# Patient Record
Sex: Male | Born: 1945
Health system: Southern US, Community
[De-identification: ages and names within clinical notes are randomized; demographics above are authoritative.]

## PROBLEM LIST (undated history)

## (undated) DIAGNOSIS — Z8679 Personal history of other diseases of the circulatory system: Secondary | ICD-10-CM

## (undated) DIAGNOSIS — Z87442 Personal history of urinary calculi: Secondary | ICD-10-CM

## (undated) DIAGNOSIS — E785 Hyperlipidemia, unspecified: Secondary | ICD-10-CM

## (undated) DIAGNOSIS — F329 Major depressive disorder, single episode, unspecified: Secondary | ICD-10-CM

## (undated) DIAGNOSIS — F32A Depression, unspecified: Secondary | ICD-10-CM

## (undated) DIAGNOSIS — I10 Essential (primary) hypertension: Secondary | ICD-10-CM

## (undated) DIAGNOSIS — K5730 Diverticulosis of large intestine without perforation or abscess without bleeding: Secondary | ICD-10-CM

## (undated) DIAGNOSIS — F429 Obsessive-compulsive disorder, unspecified: Secondary | ICD-10-CM

## (undated) DIAGNOSIS — N201 Calculus of ureter: Secondary | ICD-10-CM

## (undated) DIAGNOSIS — C61 Malignant neoplasm of prostate: Secondary | ICD-10-CM

## (undated) DIAGNOSIS — F419 Anxiety disorder, unspecified: Secondary | ICD-10-CM

## (undated) DIAGNOSIS — Z9889 Other specified postprocedural states: Secondary | ICD-10-CM

## (undated) HISTORY — DX: Obsessive-compulsive disorder, unspecified: F42.9

## (undated) HISTORY — PX: HERNIA REPAIR: SHX51

## (undated) HISTORY — DX: Depression, unspecified: F32.A

## (undated) HISTORY — DX: Major depressive disorder, single episode, unspecified: F32.9

## (undated) HISTORY — PX: TONSILLECTOMY: SUR1361

## (undated) HISTORY — DX: Essential (primary) hypertension: I10

## (undated) HISTORY — PX: WISDOM TOOTH EXTRACTION: SHX21

## (undated) HISTORY — DX: Hyperlipidemia, unspecified: E78.5

## (undated) HISTORY — DX: Diverticulosis of large intestine without perforation or abscess without bleeding: K57.30

---

## 1999-04-05 ENCOUNTER — Encounter: Payer: Self-pay | Admitting: Emergency Medicine

## 1999-04-05 ENCOUNTER — Emergency Department (HOSPITAL_COMMUNITY): Admission: EM | Admit: 1999-04-05 | Discharge: 1999-04-05 | Payer: Self-pay | Admitting: Emergency Medicine

## 1999-04-06 ENCOUNTER — Ambulatory Visit (HOSPITAL_COMMUNITY): Admission: RE | Admit: 1999-04-06 | Discharge: 1999-04-06 | Payer: Self-pay | Admitting: Urology

## 1999-04-06 ENCOUNTER — Encounter: Payer: Self-pay | Admitting: Urology

## 1999-04-07 ENCOUNTER — Emergency Department (HOSPITAL_COMMUNITY): Admission: EM | Admit: 1999-04-07 | Discharge: 1999-04-07 | Payer: Self-pay | Admitting: Emergency Medicine

## 1999-04-07 ENCOUNTER — Encounter: Payer: Self-pay | Admitting: Emergency Medicine

## 1999-04-09 ENCOUNTER — Emergency Department (HOSPITAL_COMMUNITY): Admission: EM | Admit: 1999-04-09 | Discharge: 1999-04-09 | Payer: Self-pay | Admitting: Internal Medicine

## 1999-04-09 ENCOUNTER — Encounter: Payer: Self-pay | Admitting: Urology

## 2006-03-26 ENCOUNTER — Ambulatory Visit: Payer: Self-pay | Admitting: Family Medicine

## 2007-04-17 HISTORY — PX: COLONOSCOPY: SHX174

## 2007-05-08 ENCOUNTER — Ambulatory Visit: Payer: Self-pay | Admitting: Family Medicine

## 2007-05-12 ENCOUNTER — Ambulatory Visit: Payer: Self-pay | Admitting: Gastroenterology

## 2007-05-23 ENCOUNTER — Ambulatory Visit: Payer: Self-pay | Admitting: Gastroenterology

## 2007-05-23 LAB — HM COLONOSCOPY

## 2008-05-06 ENCOUNTER — Ambulatory Visit: Payer: Self-pay | Admitting: Family Medicine

## 2009-02-11 ENCOUNTER — Ambulatory Visit: Payer: Self-pay | Admitting: Family Medicine

## 2009-04-25 ENCOUNTER — Ambulatory Visit: Payer: Self-pay | Admitting: Family Medicine

## 2009-06-10 ENCOUNTER — Ambulatory Visit: Payer: Self-pay | Admitting: Family Medicine

## 2009-07-21 ENCOUNTER — Ambulatory Visit: Payer: Self-pay | Admitting: Family Medicine

## 2010-06-12 ENCOUNTER — Encounter (INDEPENDENT_AMBULATORY_CARE_PROVIDER_SITE_OTHER): Payer: BC Managed Care – PPO | Admitting: Family Medicine

## 2010-06-12 DIAGNOSIS — E559 Vitamin D deficiency, unspecified: Secondary | ICD-10-CM

## 2010-06-12 DIAGNOSIS — Z0000 Encounter for general adult medical examination without abnormal findings: Secondary | ICD-10-CM

## 2010-06-12 DIAGNOSIS — J069 Acute upper respiratory infection, unspecified: Secondary | ICD-10-CM

## 2010-06-12 DIAGNOSIS — Z79899 Other long term (current) drug therapy: Secondary | ICD-10-CM

## 2010-11-27 ENCOUNTER — Encounter: Payer: Self-pay | Admitting: Family Medicine

## 2010-11-27 ENCOUNTER — Ambulatory Visit (INDEPENDENT_AMBULATORY_CARE_PROVIDER_SITE_OTHER): Payer: BC Managed Care – PPO | Admitting: Family Medicine

## 2010-11-27 VITALS — BP 120/84 | HR 76 | Ht 70.0 in | Wt 167.0 lb

## 2010-11-27 DIAGNOSIS — M545 Low back pain, unspecified: Secondary | ICD-10-CM

## 2010-11-27 DIAGNOSIS — S7000XA Contusion of unspecified hip, initial encounter: Secondary | ICD-10-CM

## 2010-11-27 MED ORDER — KETOROLAC TROMETHAMINE 60 MG/2ML IM SOLN
60.0000 mg | Freq: Four times a day (QID) | INTRAMUSCULAR | Status: AC | PRN
  Administered 2010-11-27: 60 mg via INTRAMUSCULAR

## 2010-11-27 MED ORDER — NAPROXEN 500 MG PO TABS: 500.0000 mg | ORAL_TABLET | Freq: Two times a day (BID) | ORAL | Status: DC

## 2010-11-27 NOTE — Patient Instructions (Signed)
Hip and back strain after fall today.  Do not suspect fracture.  Heat, stretches, avoidance of bending/lifting/twisting.  Anti-inflammatories.  If worsening pain, please return for re-evaluation  You may start the Naproxen 6 hours after the Toradol today.  Do not take other anti-inflammatories along with the Naproxen.  Tylenol is fine to take along with it, if you need additional pain medication

## 2010-11-27 NOTE — Progress Notes (Signed)
Patient presents for evaluation of right hip pain, s/p fall this morning.  Fell playing tennis this morning, landing directly onto his right lateral hip. Now having some pain in his R lower back, hip.  He has a history of intermittent problems with R sided low back pain.  He took some ibuprofen (400 mg twice during the day today), hard to say if it helped.  Pain is less when sitting still, just occasional twinge, but hurts to move.  Hurts with standing, movements (not specifically with weight bearing though), more of an aching in the R low back.  Denies any groin pain.  Past Medical History  Diagnosis Date  . Depression     and OCD  . Diverticulosis of colon   . Renal calculus or stone   . Unspecified vitamin D deficiency     History reviewed. No pertinent past surgical history.  History   Social History  . Marital Status: Single    Spouse Name: N/A    Number of Children: N/A  . Years of Education: N/A   Occupational History  . Not on file.   Social History Main Topics  . Smoking status: Former Smoker    Quit date: 04/16/2005  . Smokeless tobacco: Never Used  . Alcohol Use: Yes     2-3 glasses of wine per night  . Drug Use: No  . Sexually Active: Not on file   Other Topics Concern  . Not on file   Social History Narrative  . No narrative on file    No family history on file.  Current outpatient prescriptions:aspirin 81 MG tablet, Take 81 mg by mouth daily.  , Disp: , Rfl: ;  Cholecalciferol (VITAMIN D) 2000 UNITS CAPS, Take 1 capsule by mouth daily.  , Disp: , Rfl: ;  fluvoxaMINE (LUVOX) 100 MG tablet, Take 100-200 mg by mouth at bedtime.  , Disp: , Rfl: ;  Multiple Vitamins-Minerals (MULTIVITAMIN WITH MINERALS) tablet, Take 1 tablet by mouth daily.  , Disp: , Rfl:  naproxen (NAPROSYN) 500 MG tablet, Take 1 tablet (500 mg total) by mouth 2 (two) times daily with a meal., Disp: 30 tablet, Rfl: 0 Current facility-administered medications:ketorolac (TORADOL) injection 60 mg,  60 mg, Intramuscular, Q6H PRN, Lavonda Jumbo, MD, 60 mg at 11/27/10 1618  No Known Allergies  ROS:  Denies bleeding, bruising, fever, chest pain, numbness, tinging, weakness or other concerns. No congestion, URI symtpoms. See HPI  PHYSICAL EXAM: BP 120/84  Pulse 76  Ht 5\' 10"  (1.778 m)  Wt 167 lb (75.751 kg)  BMI 23.96 kg/m2 Well developed, pleasant male, in mild discomfort with certain movements, appears comfortable at rest Spine nontender.  Tender at R SI joint and surrounding areas.  Mild spasm of R paraspinous muscles.  Pain in R low back with movement of L leg (testing of pyriformis muscles)  Slight tightness of R pyriformis.  Negative SLR.  Tender over R greater trochanter. DTR's 2+ and symmetric, normal strength, sensation. Normal gait Skin: intact Psych: normal mood, affect  ASSESSMENT/PLAN: 1. Lumbago  naproxen (NAPROSYN) 500 MG tablet, ketorolac (TORADOL) injection 60 mg  2. Contusion, hip     right hip, s/p fall today while playing tennis    Hip and back strain s/p fall today.  Do not suspect fracture.  Heat, stretches, avoidance of bending/lifting/twisting.  Anti-inflammatories.  If worsening pain, please return for re-evaluation. NSAID precautions reviewed

## 2011-07-03 ENCOUNTER — Encounter: Payer: Self-pay | Admitting: Internal Medicine

## 2011-07-04 ENCOUNTER — Encounter: Payer: Self-pay | Admitting: Family Medicine

## 2011-07-04 ENCOUNTER — Ambulatory Visit (INDEPENDENT_AMBULATORY_CARE_PROVIDER_SITE_OTHER): Payer: BC Managed Care – PPO | Admitting: Family Medicine

## 2011-07-04 VITALS — BP 136/90 | HR 58 | Ht 69.5 in | Wt 170.0 lb

## 2011-07-04 DIAGNOSIS — Z0000 Encounter for general adult medical examination without abnormal findings: Secondary | ICD-10-CM

## 2011-07-04 DIAGNOSIS — Z8639 Personal history of other endocrine, nutritional and metabolic disease: Secondary | ICD-10-CM

## 2011-07-04 DIAGNOSIS — Z87442 Personal history of urinary calculi: Secondary | ICD-10-CM | POA: Insufficient documentation

## 2011-07-04 DIAGNOSIS — E785 Hyperlipidemia, unspecified: Secondary | ICD-10-CM | POA: Insufficient documentation

## 2011-07-04 DIAGNOSIS — Z87898 Personal history of other specified conditions: Secondary | ICD-10-CM

## 2011-07-04 DIAGNOSIS — J309 Allergic rhinitis, unspecified: Secondary | ICD-10-CM

## 2011-07-04 DIAGNOSIS — Z125 Encounter for screening for malignant neoplasm of prostate: Secondary | ICD-10-CM

## 2011-07-04 DIAGNOSIS — N200 Calculus of kidney: Secondary | ICD-10-CM

## 2011-07-04 DIAGNOSIS — F429 Obsessive-compulsive disorder, unspecified: Secondary | ICD-10-CM

## 2011-07-04 DIAGNOSIS — J302 Other seasonal allergic rhinitis: Secondary | ICD-10-CM

## 2011-07-04 DIAGNOSIS — E782 Mixed hyperlipidemia: Secondary | ICD-10-CM | POA: Insufficient documentation

## 2011-07-04 HISTORY — DX: Hyperlipidemia, unspecified: E78.5

## 2011-07-04 HISTORY — DX: Obsessive-compulsive disorder, unspecified: F42.9

## 2011-07-04 LAB — PSA: PSA: 3.29 ng/mL (ref <=4.00)

## 2011-07-04 LAB — HEMOCCULT GUIAC POC 1CARD (OFFICE)

## 2011-07-04 MED ORDER — FLUVOXAMINE MALEATE 100 MG PO TABS: 100.0000 mg | ORAL_TABLET | Freq: Every day | ORAL | Status: DC

## 2011-07-04 NOTE — Progress Notes (Signed)
  Subjective:    Patient ID: [name] [name], male    DOB: February 01, 1946, 66 y.o.   MRN: [id number]  HPI He is here for complete examination. He has no particular concerns or complaints. His immunizations are up-to-date. He continues on his Luvox and is doing quite well on this. His work is going well. He is not involved in a relationship. He has not had any difficulty with renal stones in his allergies are under good control.   Review of Systems  Constitutional: Negative.   HENT: Negative.   Eyes: Negative.   Respiratory: Negative.   Cardiovascular: Negative.   Gastrointestinal: Negative.   Genitourinary: Negative.   Musculoskeletal: Negative.   Skin: Negative.   Neurological: Negative.   Hematological: Negative.   Psychiatric/Behavioral: Negative.        Objective:   Physical Exam BP 136/90  Pulse 58  Ht 5' 9.5" (1.765 m)  Wt 170 lb (77.111 kg)  BMI 24.74 kg/m2  General Appearance:    Alert, cooperative, no distress, appears stated age  Head:    Normocephalic, without obvious abnormality, atraumatic  Eyes:    PERRL, conjunctiva/corneas clear, EOM's intact, fundi    benign  Ears:    Normal TM's and external ear canals  Nose:   Nares normal, mucosa normal, no drainage or sinus   tenderness  Throat:   Lips, mucosa, and tongue normal; teeth and gums normal  Neck:   Supple, no lymphadenopathy;  thyroid:  no   enlargement/tenderness/nodules; no carotid   bruit or JVD  Back:    Spine nontender, no curvature, ROM normal, no CVA     tenderness  Lungs:     Clear to auscultation bilaterally without wheezes, rales or     ronchi; respirations unlabored  Chest Wall:    No tenderness or deformity   Heart:    Regular rate and rhythm, S1 and S2 normal, no murmur, rub   or gallop  Breast Exam:    No chest wall tenderness, masses or gynecomastia  Abdomen:     Soft, non-tender, nondistended, normoactive bowel sounds,    no masses, no hepatosplenomegaly  Genitalia:    Normal male external  genitalia without lesions.  Testicles without masses.  No inguinal hernias.  Rectal:    Normal sphincter tone, no masses or tenderness; guaiac negative stool.  Prostate smooth, no nodules, not enlarged.  Extremities:   No clubbing, cyanosis or edema  Pulses:   2+ and symmetric all extremities  Skin:   Skin color, texture, turgor normal, no rashes or lesions  Lymph nodes:   Cervical, supraclavicular, and axillary nodes normal  Neurologic:   CNII-XII intact, normal strength, sensation and gait; reflexes 2+ and symmetric throughout          Psych:   Normal mood, affect, hygiene and grooming.           Assessment & Plan:   1. Routine general medical examination at a health care facility  Hemoccult - 1 Card (office)  2. Special screening for malignant neoplasm of prostate  PSA  3. OCD (obsessive compulsive disorder)  fluvoxaMINE (LUVOX) 100 MG tablet  4. Allergic rhinitis, seasonal    5. Renal stone    6. History of vitamin D deficiency    7. Hyperlipidemia LDL goal < 100

## 2011-11-06 ENCOUNTER — Encounter: Payer: Self-pay | Admitting: Family Medicine

## 2011-11-06 ENCOUNTER — Ambulatory Visit (INDEPENDENT_AMBULATORY_CARE_PROVIDER_SITE_OTHER): Payer: BC Managed Care – PPO | Admitting: Family Medicine

## 2011-11-06 VITALS — BP 150/100 | HR 73 | Wt 167.0 lb

## 2011-11-06 DIAGNOSIS — IMO0002 Reserved for concepts with insufficient information to code with codable children: Secondary | ICD-10-CM

## 2011-11-06 DIAGNOSIS — S76319A Strain of muscle, fascia and tendon of the posterior muscle group at thigh level, unspecified thigh, initial encounter: Secondary | ICD-10-CM

## 2011-11-06 NOTE — Patient Instructions (Signed)
Heat to the area for 20 minutes 3 times per day. Watch your pain free with walking that you can increase to jogging then has to be been full speed and then he can go back to tennis

## 2011-11-06 NOTE — Progress Notes (Signed)
  Subjective:    Patient ID: [name] [name], male    DOB: 1946-01-22, 66 y.o.   MRN: [id number]  HPI While playing tennis several weeks ago he developed some left posterior thigh discomfort. He did give it a rest for a week or 2 however when he started playing tennis again the symptoms recurred. At the present time he is having only minimal discomfort with walking.   Review of Systems     Objective:   Physical Exam Exam of the left hip shows no pain over the initial tuberosity. Exam of the biceps femoris shows no palpable tenderness swelling or defects. Good hip motion.       Assessment & Plan:   1. Hamstring strain    explained the mechanism of the injury. Encouraged him to slowly increase his physical activities based on pain. He'll start walking and then progressed to jogging half and full speed. If he has further difficulty, he will call me.

## 2012-06-26 ENCOUNTER — Encounter: Payer: Self-pay | Admitting: Family Medicine

## 2012-06-26 ENCOUNTER — Ambulatory Visit: Payer: BC Managed Care – PPO | Admitting: Family Medicine

## 2012-06-26 VITALS — BP 110/80 | HR 57 | Wt 169.0 lb

## 2012-06-26 DIAGNOSIS — Z8679 Personal history of other diseases of the circulatory system: Secondary | ICD-10-CM

## 2012-06-26 NOTE — Progress Notes (Signed)
  Subjective:    Patient ID: [name] [name], male    DOB: [birth year], 67 y.o.   MRN: [id number]  HPI He is here for followup visit after a diagnosis of atrial fibrillation. He states that on February 28 he visited friends in [place] and went out to dinner and had more to drink than usual. He was awakened in the middle night with chest tightness, dizziness. He did check his pulse and noted an irregular and fast pulse rate. He then went to the emergency room with a diagnosis of atrial fibrillation was made. He was given IV medications which did not convert him however when he switched to Metroprolol, he returned regular rhythm. He was admitted to the hospital. He distress tests as well as EKGs and blood studies. He was sent home on metoprolol and atorvastatin.   Review of Systems     Objective:   Physical Exam Alert and in no distress. Cardiac exam shows a regular rhythm. Lungs are clear to auscultation.       Assessment & Plan:  History of atrial fibrillation I explained that his atrial fibrillation was probably due to the excessive alcohol consumption. I will review the records and discuss continuation of his beta blocker was cardiologist after it had a chance to get all the records. We also discussed the simvastatin he was placed on. I recommended that he hold this until I can get the records. Previous record indicates an LDL of 131.

## 2012-06-26 NOTE — Patient Instructions (Signed)
Hold the simvastatin. Check with me in about a month and hopefully by then we'll have the hospital record

## 2012-06-30 ENCOUNTER — Encounter: Payer: Self-pay | Admitting: Internal Medicine

## 2012-07-08 ENCOUNTER — Encounter: Payer: BC Managed Care – PPO | Admitting: Family Medicine

## 2012-07-28 ENCOUNTER — Ambulatory Visit (INDEPENDENT_AMBULATORY_CARE_PROVIDER_SITE_OTHER): Payer: BC Managed Care – PPO | Admitting: Family Medicine

## 2012-07-28 ENCOUNTER — Other Ambulatory Visit: Payer: Self-pay | Admitting: Family Medicine

## 2012-07-28 ENCOUNTER — Encounter: Payer: Self-pay | Admitting: Family Medicine

## 2012-07-28 VITALS — BP 118/78 | HR 64 | Ht 70.0 in | Wt 170.0 lb

## 2012-07-28 DIAGNOSIS — Z8679 Personal history of other diseases of the circulatory system: Secondary | ICD-10-CM

## 2012-07-28 DIAGNOSIS — Z125 Encounter for screening for malignant neoplasm of prostate: Secondary | ICD-10-CM

## 2012-07-28 DIAGNOSIS — J309 Allergic rhinitis, unspecified: Secondary | ICD-10-CM

## 2012-07-28 DIAGNOSIS — Z0000 Encounter for general adult medical examination without abnormal findings: Secondary | ICD-10-CM

## 2012-07-28 DIAGNOSIS — J302 Other seasonal allergic rhinitis: Secondary | ICD-10-CM

## 2012-07-28 DIAGNOSIS — F429 Obsessive-compulsive disorder, unspecified: Secondary | ICD-10-CM

## 2012-07-28 DIAGNOSIS — E785 Hyperlipidemia, unspecified: Secondary | ICD-10-CM

## 2012-07-28 HISTORY — DX: Personal history of other diseases of the circulatory system: Z86.79

## 2012-07-28 LAB — POCT URINALYSIS DIPSTICK
Bilirubin, UA: NEGATIVE
Blood, UA: NEGATIVE
Clarity, UA: 5
Glucose, UA: NEGATIVE
Ketones, UA: NEGATIVE
Leukocytes, UA: NEGATIVE
Nitrite, UA: NEGATIVE
Protein, UA: NEGATIVE
Spec Grav, UA: 1.02
Urobilinogen, UA: NEGATIVE
pH, UA: 5

## 2012-07-28 NOTE — Patient Instructions (Signed)
Stop the metoprolol. If you know your heart rate is becoming irregular, let me know

## 2012-07-28 NOTE — Progress Notes (Signed)
Subjective:    Patient ID: [name] [name], male    DOB: Jun 28, 1945, 67 y.o.   MRN: [id number]  HPI For complete examination. He does have a history of atrial fibrillation. The history is significant for this being related to alcohol consumption. He also did have a course of prednisone prior to this that might possibly contribute to it. He has had no chest pain, irregular heartbeat. He did stop taking his Lipitor with his last visit. There is question whether he should also stop the beta blocker. His allergies seem to be under good control. He continues on his psychotropic medications and again is doing well on them. He does see orthopedics for left-sided back. Presently he is taking Mobic for this. The prednisone was given for his back pain. Social and family history were reviewed.   Review of Systems  Constitutional: Negative.   HENT: Negative.   Eyes: Negative.   Respiratory: Negative.   Cardiovascular: Negative.   Gastrointestinal: Negative.   Endocrine: Negative.   Genitourinary: Negative.   Allergic/Immunologic: Negative.   Neurological: Negative.        Objective:   Physical Exam BP 118/78  Pulse 64  Ht 5\' 10"  (1.778 m)  Wt 170 lb (77.111 kg)  BMI 24.39 kg/m2  General Appearance:    Alert, cooperative, no distress, appears stated age  Head:    Normocephalic, without obvious abnormality, atraumatic  Eyes:    PERRL, conjunctiva/corneas clear, EOM's intact,   Ears:    Normal TM's and external ear canals  Nose:   Nares normal, mucosa normal, no drainage or sinus   tenderness  Throat:   Lips, mucosa, and tongue normal; teeth and gums normal  Neck:   Supple, no lymphadenopathy;  thyroid:  no   enlargement/tenderness/nodules; no carotid   bruit or JVD  Back:    Spine nontender, no curvature, ROM normal, no CVA     tenderness  Lungs:     Clear to auscultation bilaterally without wheezes, rales or     ronchi; respirations unlabored  Chest Wall:    No tenderness or deformity   Heart:    Regular rate and rhythm, S1 and S2 normal, no murmur, rub   or gallop  Breast Exam:    No chest wall tenderness, masses or gynecomastia  Abdomen:     Soft, non-tender, nondistended, normoactive bowel sounds,    no masses, no hepatosplenomegaly  Genitalia:  deferred  Rectal:  deferred  Extremities:   No clubbing, cyanosis or edema  Pulses:   2+ and symmetric all extremities  Skin:   Skin color, texture, turgor normal, no rashes or lesions  Lymph nodes:   Cervical, supraclavicular, and axillary nodes normal  Neurologic:   CNII-XII intact, normal strength, sensation and gait; reflexes 2+ and symmetric throughout          Psych:   Normal mood, affect, hygiene and grooming.           Assessment & Plan:  Routine general medical examination at a health care facility  Hyperlipidemia LDL goal < 100 - Plan: POCT urinalysis dipstick  OCD (obsessive compulsive disorder)  Allergic rhinitis, seasonal  History of atrial fibrillation  Special screening for malignant neoplasm of prostate - Plan: PSA discussed followup concerning his atrial fibrillation and at this time we will go into the watchful waiting mode. Also discussed his beta blocker and at this time I will have him stop it. He is to continue on his psychotropic medications. I will give him  stool cards

## 2012-07-29 LAB — OTHER SOLSTAS TEST
PSA, Free Pct: 9 % — ABNORMAL LOW (ref >25)
PSA, Free: 0.35 ng/mL
PSA: 3.78 ng/mL (ref <=4.00)

## 2012-07-29 LAB — PSA: PSA: 3.74 ng/mL (ref <=4.00)

## 2012-07-30 ENCOUNTER — Other Ambulatory Visit: Payer: Self-pay | Admitting: Family Medicine

## 2012-07-30 NOTE — Telephone Encounter (Signed)
Is this okay to fill? 

## 2012-08-04 ENCOUNTER — Other Ambulatory Visit (INDEPENDENT_AMBULATORY_CARE_PROVIDER_SITE_OTHER): Payer: BC Managed Care – PPO

## 2012-08-04 DIAGNOSIS — Z1211 Encounter for screening for malignant neoplasm of colon: Secondary | ICD-10-CM

## 2012-08-04 LAB — POC HEMOCCULT BLD/STL (HOME/3-CARD/SCREEN): Fecal Occult Blood, POC: NEGATIVE

## 2013-01-05 ENCOUNTER — Other Ambulatory Visit: Payer: Self-pay

## 2013-01-05 ENCOUNTER — Telehealth: Payer: Self-pay | Admitting: Family Medicine

## 2013-01-05 ENCOUNTER — Encounter: Payer: Self-pay | Admitting: Gastroenterology

## 2013-01-05 DIAGNOSIS — R6889 Other general symptoms and signs: Secondary | ICD-10-CM

## 2013-01-05 NOTE — Telephone Encounter (Signed)
PT HAS APPOINTMENT OCT 23 DR.KAPLIN AT 9 AM PT IS AWARE

## 2013-01-05 NOTE — Telephone Encounter (Signed)
Go ahead and make the referral 

## 2013-02-05 ENCOUNTER — Ambulatory Visit (INDEPENDENT_AMBULATORY_CARE_PROVIDER_SITE_OTHER): Payer: BC Managed Care – PPO | Admitting: Gastroenterology

## 2013-02-05 ENCOUNTER — Encounter: Payer: Self-pay | Admitting: Gastroenterology

## 2013-02-05 VITALS — BP 122/82 | HR 60 | Ht 70.0 in | Wt 167.5 lb

## 2013-02-05 DIAGNOSIS — R1310 Dysphagia, unspecified: Secondary | ICD-10-CM

## 2013-02-05 NOTE — Patient Instructions (Signed)

## 2013-02-05 NOTE — Progress Notes (Signed)
History of Present Illness: Pleasant 67 year old white male referred for evaluation of dysphagia.  Has been complaining of solid food dysphagia for at least 2 years.  Symptoms are worsening.  He's had minor food impactions.  He denies pyrosis.  Weight is stable.    Past Medical History  Diagnosis Date  . Depression     and OCD  . Diverticulosis of colon   . Renal calculus or stone   . Unspecified vitamin D deficiency   . Elevated troponin 06/15/2012    hx of type 2 elevated troponin secondary to atrial fib  . H/O atrial flutter 06/15/2012    resolved  . Hyperlipidemia 06/15/2012    mild  . Borderline high cholesterol   . Obsessive compulsive disorder   . Depression    Past Surgical History  Procedure Laterality Date  . Colonoscopy  2009    Dr. Arlyce Dice   family history is not on file. Current Outpatient Prescriptions  Medication Sig Dispense Refill  . AMOXICILLIN PO Take by mouth. For dental treatments      . aspirin 81 MG tablet Take 81 mg by mouth daily.        . metoprolol tartrate (LOPRESSOR) 25 MG tablet Take 25 mg by mouth 2 (two) times daily.      . Multiple Vitamins-Minerals (MULTIVITAMIN WITH MINERALS) tablet Take 1 tablet by mouth daily.         No current facility-administered medications for this visit.   Allergies as of 02/05/2013  . (No Known Allergies)    reports that he quit smoking about 7 years ago. He has never used smokeless tobacco. He reports that he drinks about 8.4 ounces of alcohol per week. He reports that he does not use illicit drugs.     Review of Systems: Pertinent positive and negative review of systems were noted in the above HPI section. All other review of systems were otherwise negative.  Vital signs were reviewed in today's medical record Physical Exam: General: Well developed , well nourished, no acute distress Skin: anicteric Head: Normocephalic and atraumatic Eyes:  sclerae anicteric, EOMI Ears: Normal auditory acuity Mouth: No  deformity or lesions Neck: Supple, no masses or thyromegaly Lungs: Clear throughout to auscultation Heart: Regular rate and rhythm; no murmurs, rubs or bruits Abdomen: Soft, non tender and non distended. No masses, hepatosplenomegaly or hernias noted. Normal Bowel sounds Rectal:deferred Musculoskeletal: Symmetrical with no gross deformities  Skin: No lesions on visible extremities Pulses:  Normal pulses noted Extremities: No clubbing, cyanosis, edema or deformities noted Neurological: Alert oriented x 4, grossly nonfocal Cervical Nodes:  No significant cervical adenopathy Inguinal Nodes: No significant inguinal adenopathy Psychological:  Alert and cooperative. Normal mood and affect

## 2013-02-05 NOTE — Assessment & Plan Note (Signed)
2 year history of progressive solid food dysphagia-rule out esophageal stricture  Recommendations #1 upper endoscopy with dilatation as indicated  Risks, alternatives, and complications of the procedure, including bleeding, perforation, and possible need for surgery, were explained to the patient.  Patient's questions were answered.

## 2013-02-09 ENCOUNTER — Encounter: Payer: Self-pay | Admitting: Gastroenterology

## 2013-02-10 ENCOUNTER — Ambulatory Visit (AMBULATORY_SURGERY_CENTER): Payer: BC Managed Care – PPO | Admitting: Gastroenterology

## 2013-02-10 ENCOUNTER — Encounter: Payer: Self-pay | Admitting: Gastroenterology

## 2013-02-10 VITALS — BP 125/72 | HR 53 | Temp 97.3°F | Resp 13 | Ht 70.0 in | Wt 167.0 lb

## 2013-02-10 DIAGNOSIS — K222 Esophageal obstruction: Secondary | ICD-10-CM

## 2013-02-10 DIAGNOSIS — K209 Esophagitis, unspecified without bleeding: Secondary | ICD-10-CM

## 2013-02-10 DIAGNOSIS — K2980 Duodenitis without bleeding: Secondary | ICD-10-CM

## 2013-02-10 DIAGNOSIS — R1310 Dysphagia, unspecified: Secondary | ICD-10-CM

## 2013-02-10 DIAGNOSIS — K2970 Gastritis, unspecified, without bleeding: Secondary | ICD-10-CM

## 2013-02-10 MED ORDER — SODIUM CHLORIDE 0.9 % IV SOLN: 500.0000 mL | INTRAVENOUS | Status: DC

## 2013-02-10 MED ORDER — ESOMEPRAZOLE MAGNESIUM 40 MG PO CPDR: 40.0000 mg | DELAYED_RELEASE_CAPSULE | Freq: Every day | ORAL | Status: DC

## 2013-02-10 NOTE — Patient Instructions (Signed)
YOU HAD AN ENDOSCOPIC PROCEDURE TODAY AT THE [facility]: Refer to the procedure report that was given to you for any specific questions about what was found during the examination.  If the procedure report does not answer your questions, please call your gastroenterologist to clarify.  If you requested that your care partner not be given the details of your procedure findings, then the procedure report has been included in a sealed envelope for you to review at your convenience later.  YOU SHOULD EXPECT: Some feelings of bloating in the abdomen. Passage of more gas than usual.  Walking can help get rid of the air that was put into your GI tract during the procedure and reduce the bloating. If you had a lower endoscopy (such as a colonoscopy or flexible sigmoidoscopy) you may notice spotting of blood in your stool or on the toilet paper. If you underwent a bowel prep for your procedure, then you may not have a normal bowel movement for a few days.  DIET:  NOTHING TO EAT OR DRINK UNTIL 5:00 PM. 5:00 UNTIL 6:00 ONLY CLEAR LIQUIDS. AFTER 6:00 ONLY SOFT FOODS. RESUME YOUR DIET IN AM.  ACTIVITY: Your care partner should take you home directly after the procedure.  You should plan to take it easy, moving slowly for the rest of the day.  You can resume normal activity the day after the procedure however you should NOT DRIVE or use heavy machinery for 24 hours (because of the sedation medicines used during the test).    SYMPTOMS TO REPORT IMMEDIATELY: A gastroenterologist can be reached at any hour.  During normal business hours, 8:30 AM to 5:00 PM Monday through Friday, call [phone].  After hours and on weekends, please call the GI answering service at [phone] who will take a message and have the physician on call contact you.  Following upper endoscopy (EGD)  Vomiting of blood or coffee ground material  New chest pain or pain under the shoulder blades  Painful or persistently  difficult swallowing  New shortness of breath  Fever of 100F or higher  Black, tarry-looking stools  FOLLOW UP: If any biopsies were taken you will be contacted by phone or by letter within the next 1-3 weeks.  Call your gastroenterologist if you have not heard about the biopsies in 3 weeks.  Our staff will call the home number listed on your records the next business day following your procedure to check on you and address any questions or concerns that you may have at that time regarding the information given to you following your procedure. This is a courtesy call and so if there is no answer at the home number and we have not heard from you through the emergency physician on call, we will assume that you have returned to your regular daily activities without incident.  SIGNATURES/CONFIDENTIALITY: You and/or your care partner have signed paperwork which will be entered into your electronic medical record.  These signatures attest to the fact that that the information above on your After Visit Summary has been reviewed and is understood.  Full responsibility of the confidentiality of this discharge information lies with you and/or your care-partner.

## 2013-02-10 NOTE — Op Note (Signed)
[facility] [address] [place], [address]   ENDOSCOPY PROCEDURE REPORT  PATIENT: [name] [name], [name] [name]  MR#: [id number] BIRTHDATE: Jan 22, 1946 , 67  yrs. old GENDER: Male ENDOSCOPIST: Louis Meckel, MD REFERRED BY:  Sharlot Gowda, M.D. PROCEDURE DATE:  02/10/2013 PROCEDURE:  EGD w/ biopsy and Maloney dilation of esophagus ASA CLASS:     Class II INDICATIONS:  Dysphagia. MEDICATIONS: MAC sedation, administered by CRNA, propofol (Diprivan) 300mg  IV, and Simethicone 0.6cc PO TOPICAL ANESTHETIC: Cetacaine Spray  DESCRIPTION OF PROCEDURE: After the risks benefits and alternatives of the procedure were thoroughly explained, informed consent was obtained.  The LB [id number] endoscope was introduced through the mouth and advanced to the third portion of the duodenum. Without limitations.  The instrument was slowly withdrawn as the mucosa was fully examined.      There was a moderate stricture at the GE junction with areas of erosive esophagitis..  The 9.8 mm gastroscope easily traversed the stricture. Were multiple superficial erosions in the gastric antrum and duodenal bulb.  Biopsies were taken in the duodenal bulb.   The remainder of the upper endoscopy exam was otherwise normal. Retroflexed views revealed no abnormalities.     The scope was then withdrawn from the patient.  A #52 Jerene Dilling dilator was passed with mild resistance.  There was no heme.  COMPLICATIONS: There were no complications. ENDOSCOPIC IMPRESSION: 1.  esophageal stricture-status post Maloney dilation 2.  erosive esophagitis, gastritis and duodenitis  RECOMMENDATIONS: 1.  Begin Nexium 40 mg daily 2.  await biopsy 3.  avoid NSAIDs 4.  office visit one month  REPEAT EXAM:  eSigned:  Louis Meckel, MD 02/10/2013 4:08 PM   CC:  PATIENT NAME:  [name] [name], [name] [name] MR#: [id number]

## 2013-02-10 NOTE — Progress Notes (Signed)
Lidocaine-40mg IV prior to Propofol InductionPropofol given over incremental dosages 

## 2013-02-10 NOTE — Progress Notes (Signed)
1545  BP 154/98 advised Brennan Bailey CRNA, of current BP and BPs on adm.  Left 160/101, and Right 167/20.

## 2013-02-10 NOTE — Progress Notes (Signed)
Patient did not experience any of the following events: a burn prior to discharge; a fall within the facility; wrong site/side/patient/procedure/implant event; or a hospital transfer or hospital admission upon discharge from the facility. (G8907) Patient did not have preoperative order for IV antibiotic SSI prophylaxis. (G8918)  

## 2013-02-10 NOTE — Progress Notes (Signed)
Called to room to assist during endoscopic procedure.  Patient ID and intended procedure confirmed with present staff. Received instructions for my participation in the procedure from the performing physician.  

## 2013-02-11 ENCOUNTER — Telehealth: Payer: Self-pay | Admitting: *Deleted

## 2013-02-11 NOTE — Telephone Encounter (Signed)
  Follow up Call-  Call back number 02/10/2013  Post procedure Call Back phone  # [phone]  Permission to leave phone message Yes   [facility]

## 2013-02-18 ENCOUNTER — Encounter: Payer: Self-pay | Admitting: Gastroenterology

## 2013-02-19 ENCOUNTER — Other Ambulatory Visit: Payer: Self-pay

## 2013-03-16 ENCOUNTER — Ambulatory Visit: Payer: BC Managed Care – PPO | Admitting: Gastroenterology

## 2013-04-01 ENCOUNTER — Encounter: Payer: Self-pay | Admitting: Gastroenterology

## 2013-04-01 ENCOUNTER — Ambulatory Visit (INDEPENDENT_AMBULATORY_CARE_PROVIDER_SITE_OTHER): Payer: BC Managed Care – PPO | Admitting: Gastroenterology

## 2013-04-01 VITALS — BP 130/80 | HR 64 | Ht 69.5 in | Wt 166.5 lb

## 2013-04-01 DIAGNOSIS — Z8719 Personal history of other diseases of the digestive system: Secondary | ICD-10-CM

## 2013-04-01 DIAGNOSIS — K222 Esophageal obstruction: Secondary | ICD-10-CM

## 2013-04-01 DIAGNOSIS — R1310 Dysphagia, unspecified: Secondary | ICD-10-CM

## 2013-04-01 HISTORY — DX: Personal history of other diseases of the digestive system: Z87.19

## 2013-04-01 NOTE — Progress Notes (Signed)
          History of Present Illness:  The patient has returned following upper endoscopy with [facility] dilation.  He no longer has dysphagia.  He takes Nexium daily.  He denies pyrosis.    Review of Systems: Pertinent positive and negative review of systems were noted in the above HPI section. All other review of systems were otherwise negative.    Current Medications, Allergies, Past Medical History, Past Surgical History, Family History and Social History were reviewed in [employer] electronic medical record  Vital signs were reviewed in today's medical record. Physical Exam: General: Well developed , well nourished, no acute distress

## 2013-04-01 NOTE — Patient Instructions (Signed)
Follow up as needed

## 2013-04-01 NOTE — Assessment & Plan Note (Signed)
Resolved following Maloney dilation

## 2013-04-01 NOTE — Assessment & Plan Note (Signed)
Asymptomatic following dilatation therapy.  Plan to continue Nexium at a maintenance dose every 2-3 days and repeat dilatation as needed.

## 2013-07-30 ENCOUNTER — Encounter: Payer: Self-pay | Admitting: Family Medicine

## 2013-07-30 ENCOUNTER — Ambulatory Visit (INDEPENDENT_AMBULATORY_CARE_PROVIDER_SITE_OTHER): Payer: BC Managed Care – PPO | Admitting: Family Medicine

## 2013-07-30 VITALS — BP 140/98 | HR 56 | Ht 69.0 in | Wt 168.0 lb

## 2013-07-30 DIAGNOSIS — E785 Hyperlipidemia, unspecified: Secondary | ICD-10-CM

## 2013-07-30 DIAGNOSIS — Z8679 Personal history of other diseases of the circulatory system: Secondary | ICD-10-CM

## 2013-07-30 DIAGNOSIS — F429 Obsessive-compulsive disorder, unspecified: Secondary | ICD-10-CM

## 2013-07-30 DIAGNOSIS — J309 Allergic rhinitis, unspecified: Secondary | ICD-10-CM

## 2013-07-30 DIAGNOSIS — K222 Esophageal obstruction: Secondary | ICD-10-CM

## 2013-07-30 DIAGNOSIS — R972 Elevated prostate specific antigen [PSA]: Secondary | ICD-10-CM

## 2013-07-30 DIAGNOSIS — Z79899 Other long term (current) drug therapy: Secondary | ICD-10-CM

## 2013-07-30 DIAGNOSIS — N200 Calculus of kidney: Secondary | ICD-10-CM

## 2013-07-30 DIAGNOSIS — J302 Other seasonal allergic rhinitis: Secondary | ICD-10-CM

## 2013-07-30 LAB — CBC WITH DIFFERENTIAL/PLATELET
Basophils Absolute: 0 10*3/uL (ref 0.0–0.1)
Basophils Relative: 0 % (ref 0–1)
Eosinophils Absolute: 0.2 10*3/uL (ref 0.0–0.7)
Eosinophils Relative: 4 % (ref 0–5)
HCT: 47 % (ref 39.0–52.0)
Hemoglobin: 16.1 g/dL (ref 13.0–17.0)
Lymphocytes Relative: 38 % (ref 12–46)
Lymphs Abs: 1.7 10*3/uL (ref 0.7–4.0)
MCH: 31.6 pg (ref 26.0–34.0)
MCHC: 34.3 g/dL (ref 30.0–36.0)
MCV: 92.3 fL (ref 78.0–100.0)
Monocytes Absolute: 0.5 10*3/uL (ref 0.1–1.0)
Monocytes Relative: 12 % (ref 3–12)
Neutro Abs: 2.1 10*3/uL (ref 1.7–7.7)
Neutrophils Relative %: 46 % (ref 43–77)
Platelets: 157 10*3/uL (ref 150–400)
RBC: 5.09 MIL/uL (ref 4.22–5.81)
RDW: 13.7 % (ref 11.5–15.5)
WBC: 4.5 10*3/uL (ref 4.0–10.5)

## 2013-07-30 LAB — COMPREHENSIVE METABOLIC PANEL
ALT: 22 U/L (ref 0–53)
AST: 29 U/L (ref 0–37)
Albumin: 4.4 g/dL (ref 3.5–5.2)
Alkaline Phosphatase: 76 U/L (ref 39–117)
BUN: 18 mg/dL (ref 6–23)
CO2: 27 mEq/L (ref 19–32)
Calcium: 9.4 mg/dL (ref 8.4–10.5)
Chloride: 105 mEq/L (ref 96–112)
Creat: 1.05 mg/dL (ref 0.50–1.35)
Glucose, Bld: 86 mg/dL (ref 70–99)
Potassium: 4.3 mEq/L (ref 3.5–5.3)
Sodium: 140 mEq/L (ref 135–145)
Total Bilirubin: 0.6 mg/dL (ref 0.2–1.2)
Total Protein: 6.4 g/dL (ref 6.0–8.3)

## 2013-07-30 LAB — LIPID PANEL
Cholesterol: 234 mg/dL — ABNORMAL HIGH (ref 0–200)
HDL: 64 mg/dL (ref >39)
LDL Cholesterol: 134 mg/dL — ABNORMAL HIGH (ref 0–99)
Total CHOL/HDL Ratio: 3.7 Ratio
Triglycerides: 178 mg/dL — ABNORMAL HIGH (ref <150)
VLDL: 36 mg/dL (ref 0–40)

## 2013-07-30 MED ORDER — FLUVOXAMINE MALEATE ER 100 MG PO CP24: 100.0000 mg | ORAL_CAPSULE | Freq: Every day | ORAL | Status: DC

## 2013-07-30 NOTE — Progress Notes (Signed)
   Subjective:    Patient ID: [name] [name], male    DOB: May 06, 1945, 68 y.o.   MRN: [id number]  HPI He is here for a medication check. He has noted increased difficulty with swallowing and now notes that carrots do tend to get slightly stuck. He has a previous history of esophageal stricture and did have a dilatation done last year. He is supposed to be taking Nexium .He continues on Luvox and is doing well on this. He has a previous history of renal stones but no recent difficulty. He also has a past history of atrial fibrillation and again no regular heartbeat noted. Review of record indicates elevated lipid panel. His allergies are under good control. He also has a PSA in the high 3 range. His work and home life are unchanged. He has no plans to retire anytime soon. His immunizations and health maintenance was reviewed.  Review of Systems  All other systems reviewed and are negative.      Objective:   Physical Exam alert and in no distress. Tympanic membranes and canals are normal. Throat is clear. Tonsils are normal. Neck is supple without adenopathy or thyromegaly. Cardiac exam shows a regular sinus rhythm without murmurs or gallops. Lungs are clear to auscultation. Abdominal exam shows no masses or tenderness with normal bowel sounds.        Assessment & Plan:  OCD (obsessive compulsive disorder) - Plan: Fluvoxamine Maleate (LUVOX CR) 100 MG CP24  Stricture and stenosis of esophagus - Plan: CBC with Differential, Comprehensive metabolic panel  Renal stone - Plan: CBC with Differential, Comprehensive metabolic panel  History of atrial fibrillation  Hyperlipidemia LDL goal < 100 - Plan: Lipid panel  Allergic rhinitis, seasonal  Elevated PSA - Plan: PSA, total and free  Encounter for long-term (current) use of other medications - Plan: CBC with Differential, Comprehensive metabolic panel, Lipid panel, Hepatitis C Antibody  encouraged him to set up an appointment with Dr. Deatra Ina  and be proactive on this rather than wait for further stricture type symptoms.

## 2013-07-30 NOTE — Patient Instructions (Signed)
Call Dr. Deatra Ina and get another appointment

## 2013-07-31 LAB — PSA, TOTAL AND FREE
PSA, Free Pct: 10 % — ABNORMAL LOW (ref >25)
PSA, Free: 0.4 ng/mL
PSA: 3.93 ng/mL (ref <=4.00)

## 2013-07-31 LAB — HEPATITIS C ANTIBODY: HCV Ab: NEGATIVE

## 2013-09-09 DIAGNOSIS — R972 Elevated prostate specific antigen [PSA]: Secondary | ICD-10-CM | POA: Diagnosis not present

## 2013-10-14 DIAGNOSIS — IMO0002 Reserved for concepts with insufficient information to code with codable children: Secondary | ICD-10-CM | POA: Diagnosis not present

## 2013-10-14 DIAGNOSIS — R972 Elevated prostate specific antigen [PSA]: Secondary | ICD-10-CM | POA: Diagnosis not present

## 2013-10-17 DIAGNOSIS — R002 Palpitations: Secondary | ICD-10-CM | POA: Diagnosis not present

## 2013-10-17 DIAGNOSIS — R000 Tachycardia, unspecified: Secondary | ICD-10-CM | POA: Diagnosis not present

## 2013-10-29 ENCOUNTER — Encounter: Payer: Self-pay | Admitting: Internal Medicine

## 2013-10-30 ENCOUNTER — Encounter: Payer: Self-pay | Admitting: Family Medicine

## 2013-11-09 DIAGNOSIS — C61 Malignant neoplasm of prostate: Secondary | ICD-10-CM | POA: Diagnosis not present

## 2013-11-24 DIAGNOSIS — C61 Malignant neoplasm of prostate: Secondary | ICD-10-CM | POA: Diagnosis not present

## 2013-11-25 ENCOUNTER — Other Ambulatory Visit: Payer: Self-pay | Admitting: Urology

## 2013-11-27 ENCOUNTER — Encounter: Payer: Self-pay | Admitting: Internal Medicine

## 2013-12-01 ENCOUNTER — Ambulatory Visit: Payer: BC Managed Care – PPO | Admitting: Radiation Oncology

## 2013-12-01 ENCOUNTER — Ambulatory Visit: Payer: BC Managed Care – PPO

## 2013-12-03 DIAGNOSIS — C61 Malignant neoplasm of prostate: Secondary | ICD-10-CM | POA: Diagnosis not present

## 2013-12-03 DIAGNOSIS — R279 Unspecified lack of coordination: Secondary | ICD-10-CM | POA: Diagnosis not present

## 2013-12-03 DIAGNOSIS — M6281 Muscle weakness (generalized): Secondary | ICD-10-CM | POA: Diagnosis not present

## 2013-12-10 DIAGNOSIS — C61 Malignant neoplasm of prostate: Secondary | ICD-10-CM | POA: Diagnosis not present

## 2013-12-10 DIAGNOSIS — R279 Unspecified lack of coordination: Secondary | ICD-10-CM | POA: Diagnosis not present

## 2013-12-10 DIAGNOSIS — M6281 Muscle weakness (generalized): Secondary | ICD-10-CM | POA: Diagnosis not present

## 2013-12-11 ENCOUNTER — Encounter (HOSPITAL_COMMUNITY): Payer: Self-pay | Admitting: Pharmacy Technician

## 2013-12-15 ENCOUNTER — Encounter (HOSPITAL_COMMUNITY): Payer: Self-pay

## 2013-12-15 ENCOUNTER — Ambulatory Visit (HOSPITAL_COMMUNITY)
Admission: RE | Admit: 2013-12-15 | Discharge: 2013-12-15 | Disposition: A | Discharge status: Home / Self Care | Payer: BC Managed Care – PPO | Source: Ambulatory Visit | Attending: Urology | Admitting: Urology

## 2013-12-15 ENCOUNTER — Encounter (HOSPITAL_COMMUNITY)
Admission: RE | Admit: 2013-12-15 | Discharge: 2013-12-15 | Disposition: A | Discharge status: Home / Self Care | Payer: BC Managed Care – PPO | Source: Ambulatory Visit | Attending: Urology | Admitting: Urology

## 2013-12-15 DIAGNOSIS — C61 Malignant neoplasm of prostate: Secondary | ICD-10-CM | POA: Diagnosis not present

## 2013-12-15 DIAGNOSIS — Z01818 Encounter for other preprocedural examination: Secondary | ICD-10-CM | POA: Diagnosis not present

## 2013-12-15 HISTORY — DX: Personal history of urinary calculi: Z87.442

## 2013-12-15 LAB — CBC
HCT: 47.4 % (ref 39.0–52.0)
Hemoglobin: 16.4 g/dL (ref 13.0–17.0)
MCH: 32 pg (ref 26.0–34.0)
MCHC: 34.6 g/dL (ref 30.0–36.0)
MCV: 92.6 fL (ref 78.0–100.0)
Platelets: 146 10*3/uL — ABNORMAL LOW (ref 150–400)
RBC: 5.12 MIL/uL (ref 4.22–5.81)
RDW: 12.9 % (ref 11.5–15.5)
WBC: 4.4 10*3/uL (ref 4.0–10.5)

## 2013-12-15 LAB — BASIC METABOLIC PANEL
Anion gap: 13 (ref 5–15)
BUN: 18 mg/dL (ref 6–23)
CO2: 25 mEq/L (ref 19–32)
Calcium: 9.5 mg/dL (ref 8.4–10.5)
Chloride: 103 mEq/L (ref 96–112)
Creatinine, Ser: 1.06 mg/dL (ref 0.50–1.35)
GFR calc Af Amer: 83 mL/min — ABNORMAL LOW (ref >90)
GFR calc non Af Amer: 71 mL/min — ABNORMAL LOW (ref >90)
Glucose, Bld: 95 mg/dL (ref 70–99)
Potassium: 4.8 mEq/L (ref 3.7–5.3)
Sodium: 141 mEq/L (ref 137–147)

## 2013-12-15 NOTE — Patient Instructions (Addendum)
FOLLOW YOUR BOWEL PREP INSTRUCTIONS FROM DR. BORDEN'S OFFICE.  YOUR SURGERY IS SCHEDULED AT [facility]  ON:  Thursday  9/10  REPORT TO  SHORT STAY CENTER AT:  8:30 AM   PLEASE COME IN THE [facility] MAIN HOSPITAL ENTRANCE AND FOLLOW SIGNS TO SHORT STAY CENTER.  DO NOT EAT OR DRINK ANYTHING AFTER MIDNIGHT THE NIGHT BEFORE YOUR SURGERY.  YOU MAY BRUSH YOUR TEETH, RINSE OUT YOUR MOUTH--BUT NO WATER, NO FOOD, NO CHEWING GUM, NO MINTS, NO CANDIES, NO CHEWING TOBACCO.  PLEASE TAKE THE FOLLOWING MEDICATIONS THE AM OF YOUR SURGERY WITH A FEW SIPS OF WATER:  NO MEDICATIONS TO TAKE  DO NOT BRING VALUABLES, MONEY, CREDIT CARDS.  DO NOT WEAR JEWELRY, MAKE-UP, NAIL POLISH AND NO METAL PINS OR CLIPS IN YOUR HAIR. CONTACT LENS, DENTURES / PARTIALS, GLASSES SHOULD NOT BE WORN TO SURGERY AND IN MOST CASES-HEARING AIDS WILL NEED TO BE REMOVED.  BRING YOUR GLASSES CASE, ANY EQUIPMENT NEEDED FOR YOUR CONTACT LENS. FOR PATIENTS ADMITTED TO THE HOSPITAL--CHECK OUT TIME THE DAY OF DISCHARGE IS 11:00 AM.  ALL INPATIENT ROOMS ARE PRIVATE - WITH BATHROOM, TELEPHONE, TELEVISION AND WIFI INTERNET.                                                     PLEASE READ OVER ANY  FACT SHEETS THAT YOU WERE GIVEN: MRSA INFORMATION, BLOOD TRANSFUSION INFORMATION, INCENTIVE SPIROMETER INFORMATION.  PLEASE BE AWARE THAT YOU MAY NEED ADDITIONAL BLOOD DRAWN DAY OF YOUR SURGERY  _______________________________________________________________________   [facility] - Preparing for Surgery Before surgery, you can play an important role.  Because skin is not sterile, your skin needs to be as free of germs as possible.  You can reduce the number of germs on your skin by washing with CHG (chlorahexidine gluconate) soap before surgery.  CHG is an antiseptic cleaner which kills germs and bonds with the skin to continue killing germs even after washing. Please DO NOT use if you have an allergy to CHG or antibacterial soaps.  If  your skin becomes reddened/irritated stop using the CHG and inform your nurse when you arrive at Short Stay. Do not shave (including legs and underarms) for at least 48 hours prior to the first CHG shower.  You may shave your face/neck. Please follow these instructions carefully:  1.  Shower with CHG Soap the night before surgery and the  morning of Surgery.  2.  If you choose to wash your hair, wash your hair first as usual with your  normal  shampoo.  3.  After you shampoo, rinse your hair and body thoroughly to remove the  shampoo.                           4.  Use CHG as you would any other liquid soap.  You can apply chg directly  to the skin and wash                       Gently with a scrungie or clean washcloth.  5.  Apply the CHG Soap to your body ONLY FROM THE NECK DOWN.   Do not use on face/ open  Wound or open sores. Avoid contact with eyes, ears mouth and genitals (private parts).                       Wash face,  Genitals (private parts) with your normal soap.             6.  Wash thoroughly, paying special attention to the area where your surgery  will be performed.  7.  Thoroughly rinse your body with warm water from the neck down.  8.  DO NOT shower/wash with your normal soap after using and rinsing off  the CHG Soap.                9.  Pat yourself dry with a clean towel.            10.  Wear clean pajamas.            11.  Place clean sheets on your bed the night of your first shower and do not  sleep with pets. Day of Surgery : Do not apply any lotions/deodorants the morning of surgery.  Please wear clean clothes to the hospital/surgery center.  FAILURE TO FOLLOW THESE INSTRUCTIONS MAY RESULT IN THE CANCELLATION OF YOUR SURGERY PATIENT SIGNATURE_________________________________  NURSE SIGNATURE__________________________________  ________________________________________________________________________   [name] [name]  An incentive  spirometer is a tool that can help keep your lungs clear and active. This tool measures how well you are filling your lungs with each breath. Taking long deep breaths may help reverse or decrease the chance of developing breathing (pulmonary) problems (especially infection) following:  A long period of time when you are unable to move or be active. BEFORE THE PROCEDURE   If the spirometer includes an indicator to show your best effort, your nurse or respiratory therapist will set it to a desired goal.  If possible, sit up straight or lean slightly forward. Try not to slouch.  Hold the incentive spirometer in an upright position. INSTRUCTIONS FOR USE  1. Sit on the edge of your bed if possible, or sit up as far as you can in bed or on a chair. 2. Hold the incentive spirometer in an upright position. 3. Breathe out normally. 4. Place the mouthpiece in your mouth and seal your lips tightly around it. 5. Breathe in slowly and as deeply as possible, raising the piston or the ball toward the top of the column. 6. Hold your breath for 3-5 seconds or for as long as possible. Allow the piston or ball to fall to the bottom of the column. 7. Remove the mouthpiece from your mouth and breathe out normally. 8. Rest for a few seconds and repeat Steps 1 through 7 at least 10 times every 1-2 hours when you are awake. Take your time and take a few normal breaths between deep breaths. 9. The spirometer may include an indicator to show your best effort. Use the indicator as a goal to work toward during each repetition. 10. After each set of 10 deep breaths, practice coughing to be sure your lungs are clear. If you have an incision (the cut made at the time of surgery), support your incision when coughing by placing a pillow or rolled up towels firmly against it. Once you are able to get out of bed, walk around indoors and cough well. You may stop using the incentive spirometer when instructed by your caregiver.   RISKS AND COMPLICATIONS  Take your time so you do not  get dizzy or light-headed.  If you are in pain, you may need to take or ask for pain medication before doing incentive spirometry. It is harder to take a deep breath if you are having pain. AFTER USE  Rest and breathe slowly and easily.  It can be helpful to keep track of a log of your progress. Your caregiver can provide you with a simple table to help with this. If you are using the spirometer at home, follow these instructions: [place] IF:   You are having difficultly using the spirometer.  You have trouble using the spirometer as often as instructed.  Your pain medication is not giving enough relief while using the spirometer.  You develop fever of 100.5 F (38.1 C) or higher. SEEK IMMEDIATE MEDICAL CARE IF:   You cough up bloody sputum that had not been present before.  You develop fever of 102 F (38.9 C) or greater.  You develop worsening pain at or near the incision site. MAKE SURE YOU:   Understand these instructions.  Will watch your condition.  Will get help right away if you are not doing well or get worse. Document Released: 08/13/2006 Document Revised: 06/25/2011 Document Reviewed: 10/14/2006 Exit[employer] Patient Information 2014 [employer], [place].   ________________________________________________________________________  WHAT IS A BLOOD TRANSFUSION? Blood Transfusion Information  A transfusion is the replacement of blood or some of its parts. Blood is made up of multiple cells which provide different functions.  Red blood cells carry oxygen and are used for blood loss replacement.  White blood cells fight against infection.  Platelets control bleeding.  Plasma helps clot blood.  Other blood products are available for specialized needs, such as hemophilia or other clotting disorders. BEFORE THE TRANSFUSION  Who gives blood for transfusions?   Healthy volunteers who are fully evaluated  to make sure their blood is safe. This is blood bank blood. Transfusion therapy is the safest it has ever been in the practice of medicine. Before blood is taken from a donor, a complete history is taken to make sure that person has no history of diseases nor engages in risky social behavior (examples are intravenous drug use or sexual activity with multiple partners). The donor's travel history is screened to minimize risk of transmitting infections, such as malaria. The donated blood is tested for signs of infectious diseases, such as HIV and hepatitis. The blood is then tested to be sure it is compatible with you in order to minimize the chance of a transfusion reaction. If you or a relative donates blood, this is often done in anticipation of surgery and is not appropriate for emergency situations. It takes many days to process the donated blood. RISKS AND COMPLICATIONS Although transfusion therapy is very safe and saves many lives, the main dangers of transfusion include:   Getting an infectious disease.  Developing a transfusion reaction. This is an allergic reaction to something in the blood you were given. Every precaution is taken to prevent this. The decision to have a blood transfusion has been considered carefully by your caregiver before blood is given. Blood is not given unless the benefits outweigh the risks. AFTER THE TRANSFUSION  Right after receiving a blood transfusion, you will usually feel much better and more energetic. This is especially true if your red blood cells have gotten low (anemic). The transfusion raises the level of the red blood cells which carry oxygen, and this usually causes an energy increase.  The nurse administering the transfusion will  monitor you carefully for complications. HOME CARE INSTRUCTIONS  No special instructions are needed after a transfusion. You may find your energy is better. Speak with your caregiver about any limitations on activity for  underlying diseases you may have. SEEK MEDICAL CARE IF:   Your condition is not improving after your transfusion.  You develop redness or irritation at the intravenous (IV) site. SEEK IMMEDIATE MEDICAL CARE IF:  Any of the following symptoms occur over the next 12 hours:  Shaking chills.  You have a temperature by mouth above 102 F (38.9 C), not controlled by medicine.  Chest, back, or muscle pain.  People around you feel you are not acting correctly or are confused.  Shortness of breath or difficulty breathing.  Dizziness and fainting.  You get a rash or develop hives.  You have a decrease in urine output.  Your urine turns a dark color or changes to pink, red, or brown. Any of the following symptoms occur over the next 10 days:  You have a temperature by mouth above 102 F (38.9 C), not controlled by medicine.  Shortness of breath.  Weakness after normal activity.  The white part of the eye turns yellow (jaundice).  You have a decrease in the amount of urine or are urinating less often.  Your urine turns a dark color or changes to pink, red, or brown. Document Released: 03/30/2000 Document Revised: 06/25/2011 Document Reviewed: 11/17/2007 [facility] Patient Information 2014 [place], [place].  _______________________________________________________________________

## 2013-12-15 NOTE — Pre-Procedure Instructions (Signed)
EKG AND CXR WERE DONE TODAY - PREOP AT [facility]. 

## 2013-12-24 ENCOUNTER — Inpatient Hospital Stay (HOSPITAL_COMMUNITY): Payer: BC Managed Care – PPO | Admitting: Anesthesiology

## 2013-12-24 ENCOUNTER — Encounter (HOSPITAL_COMMUNITY): Payer: Self-pay | Admitting: *Deleted

## 2013-12-24 ENCOUNTER — Inpatient Hospital Stay (HOSPITAL_COMMUNITY)
Admission: RE | Admit: 2013-12-24 | Discharge: 2013-12-25 | DRG: 708 | Disposition: A | Discharge status: Home / Self Care | Payer: BC Managed Care – PPO | Source: Ambulatory Visit | Attending: Urology | Admitting: Urology

## 2013-12-24 ENCOUNTER — Encounter (HOSPITAL_COMMUNITY): Payer: BC Managed Care – PPO | Admitting: Anesthesiology

## 2013-12-24 ENCOUNTER — Encounter (HOSPITAL_COMMUNITY)
Admission: RE | Disposition: A | Discharge status: Home / Self Care | Payer: Self-pay | Source: Ambulatory Visit | Attending: Urology

## 2013-12-24 DIAGNOSIS — F411 Generalized anxiety disorder: Secondary | ICD-10-CM | POA: Diagnosis present

## 2013-12-24 DIAGNOSIS — Z8052 Family history of malignant neoplasm of bladder: Secondary | ICD-10-CM | POA: Diagnosis not present

## 2013-12-24 DIAGNOSIS — F329 Major depressive disorder, single episode, unspecified: Secondary | ICD-10-CM | POA: Diagnosis present

## 2013-12-24 DIAGNOSIS — C61 Malignant neoplasm of prostate: Principal | ICD-10-CM | POA: Diagnosis present

## 2013-12-24 DIAGNOSIS — Z87891 Personal history of nicotine dependence: Secondary | ICD-10-CM

## 2013-12-24 DIAGNOSIS — Z8042 Family history of malignant neoplasm of prostate: Secondary | ICD-10-CM

## 2013-12-24 DIAGNOSIS — Z7982 Long term (current) use of aspirin: Secondary | ICD-10-CM | POA: Diagnosis not present

## 2013-12-24 DIAGNOSIS — F3289 Other specified depressive episodes: Secondary | ICD-10-CM | POA: Diagnosis present

## 2013-12-24 HISTORY — PX: LYMPHADENECTOMY: SHX5960

## 2013-12-24 HISTORY — PX: ROBOT ASSISTED LAPAROSCOPIC RADICAL PROSTATECTOMY: SHX5141

## 2013-12-24 LAB — TYPE AND SCREEN
ABO/RH(D): O POS
Antibody Screen: NEGATIVE

## 2013-12-24 LAB — ABO/RH: ABO/RH(D): O POS

## 2013-12-24 LAB — HEMOGLOBIN AND HEMATOCRIT, BLOOD
HCT: 42.7 % (ref 39.0–52.0)
Hemoglobin: 14.8 g/dL (ref 13.0–17.0)

## 2013-12-24 SURGERY — ROBOTIC ASSISTED LAPAROSCOPIC RADICAL PROSTATECTOMY LEVEL 2
Anesthesia: General

## 2013-12-24 MED ORDER — FENTANYL CITRATE 0.05 MG/ML IJ SOLN
INTRAMUSCULAR | Status: DC | PRN
Start: 2013-12-24 — End: 2013-12-24
  Administered 2013-12-24: 50 ug via INTRAVENOUS
  Administered 2013-12-24 (×2): 100 ug via INTRAVENOUS

## 2013-12-24 MED ORDER — INFLUENZA VAC SPLIT QUAD 0.5 ML IM SUSY
0.5000 mL | PREFILLED_SYRINGE | INTRAMUSCULAR | Status: AC
  Administered 2013-12-25: 0.5 mL via INTRAMUSCULAR
  Filled 2013-12-24 (×2): qty 0.5

## 2013-12-24 MED ORDER — CEFAZOLIN SODIUM-DEXTROSE 2-3 GM-% IV SOLR
2.0000 g | INTRAVENOUS | Status: AC
  Administered 2013-12-24: 2 g via INTRAVENOUS

## 2013-12-24 MED ORDER — ONDANSETRON HCL 4 MG/2ML IJ SOLN
INTRAMUSCULAR | Status: AC
  Filled 2013-12-24: qty 2

## 2013-12-24 MED ORDER — HYDROCODONE-ACETAMINOPHEN 5-325 MG PO TABS: 1.0000 | ORAL_TABLET | Freq: Four times a day (QID) | ORAL | Status: DC | PRN

## 2013-12-24 MED ORDER — CIPROFLOXACIN HCL 500 MG PO TABS: 500.0000 mg | ORAL_TABLET | Freq: Two times a day (BID) | ORAL | Status: DC

## 2013-12-24 MED ORDER — HYDROMORPHONE HCL PF 2 MG/ML IJ SOLN
INTRAMUSCULAR | Status: AC
  Filled 2013-12-24: qty 1

## 2013-12-24 MED ORDER — MEPERIDINE HCL 50 MG/ML IJ SOLN
INTRAMUSCULAR | Status: AC
  Filled 2013-12-24: qty 1

## 2013-12-24 MED ORDER — ACETAMINOPHEN 325 MG PO TABS: 650.0000 mg | ORAL_TABLET | ORAL | Status: DC | PRN

## 2013-12-24 MED ORDER — DIPHENHYDRAMINE HCL 12.5 MG/5ML PO ELIX: 12.5000 mg | ORAL_SOLUTION | Freq: Four times a day (QID) | ORAL | Status: DC | PRN

## 2013-12-24 MED ORDER — KETOROLAC TROMETHAMINE 15 MG/ML IJ SOLN
15.0000 mg | Freq: Four times a day (QID) | INTRAMUSCULAR | Status: DC
  Administered 2013-12-24 – 2013-12-25 (×3): 15 mg via INTRAVENOUS
  Filled 2013-12-24 (×5): qty 1

## 2013-12-24 MED ORDER — DIPHENHYDRAMINE HCL 50 MG/ML IJ SOLN: 12.5000 mg | Freq: Four times a day (QID) | INTRAMUSCULAR | Status: DC | PRN

## 2013-12-24 MED ORDER — BUPIVACAINE-EPINEPHRINE (PF) 0.25% -1:200000 IJ SOLN
INTRAMUSCULAR | Status: AC
  Filled 2013-12-24: qty 30

## 2013-12-24 MED ORDER — ROCURONIUM BROMIDE 100 MG/10ML IV SOLN
INTRAVENOUS | Status: AC
  Filled 2013-12-24: qty 1

## 2013-12-24 MED ORDER — LIDOCAINE HCL (CARDIAC) 20 MG/ML IV SOLN
INTRAVENOUS | Status: AC
  Filled 2013-12-24: qty 5

## 2013-12-24 MED ORDER — LACTATED RINGERS IV SOLN: INTRAVENOUS | Status: DC

## 2013-12-24 MED ORDER — ONDANSETRON HCL 4 MG/2ML IJ SOLN
INTRAMUSCULAR | Status: DC | PRN
  Administered 2013-12-24: 4 mg via INTRAVENOUS

## 2013-12-24 MED ORDER — GLYCOPYRROLATE 0.2 MG/ML IJ SOLN
INTRAMUSCULAR | Status: AC
  Filled 2013-12-24: qty 3

## 2013-12-24 MED ORDER — LACTATED RINGERS IV SOLN
INTRAVENOUS | Status: DC | PRN
  Administered 2013-12-24: 13:00:00

## 2013-12-24 MED ORDER — PROPOFOL 10 MG/ML IV BOLUS
INTRAVENOUS | Status: AC
  Filled 2013-12-24: qty 20

## 2013-12-24 MED ORDER — LACTATED RINGERS IV SOLN
INTRAVENOUS | Status: DC | PRN
  Administered 2013-12-24 (×2): via INTRAVENOUS

## 2013-12-24 MED ORDER — FENTANYL CITRATE 0.05 MG/ML IJ SOLN
INTRAMUSCULAR | Status: AC
  Filled 2013-12-24: qty 5

## 2013-12-24 MED ORDER — PROPOFOL 10 MG/ML IV BOLUS
INTRAVENOUS | Status: DC | PRN
  Administered 2013-12-24: 150 mg via INTRAVENOUS
  Administered 2013-12-24: 50 mg via INTRAVENOUS

## 2013-12-24 MED ORDER — DOCUSATE SODIUM 100 MG PO CAPS
100.0000 mg | ORAL_CAPSULE | Freq: Two times a day (BID) | ORAL | Status: DC
  Administered 2013-12-24 – 2013-12-25 (×2): 100 mg via ORAL
  Filled 2013-12-24 (×3): qty 1

## 2013-12-24 MED ORDER — HYDROMORPHONE HCL PF 1 MG/ML IJ SOLN
0.2500 mg | INTRAMUSCULAR | Status: DC | PRN
  Administered 2013-12-24 (×2): 0.5 mg via INTRAVENOUS

## 2013-12-24 MED ORDER — GLYCOPYRROLATE 0.2 MG/ML IJ SOLN
INTRAMUSCULAR | Status: DC | PRN
  Administered 2013-12-24: 0.6 mg via INTRAVENOUS
  Administered 2013-12-24: 0.2 mg via INTRAVENOUS

## 2013-12-24 MED ORDER — NEOSTIGMINE METHYLSULFATE 10 MG/10ML IV SOLN
INTRAVENOUS | Status: AC
  Filled 2013-12-24: qty 1

## 2013-12-24 MED ORDER — SODIUM CHLORIDE 0.9 % IV BOLUS (SEPSIS)
1000.0000 mL | Freq: Once | INTRAVENOUS | Status: AC
  Administered 2013-12-24: 1000 mL via INTRAVENOUS

## 2013-12-24 MED ORDER — HYDROMORPHONE HCL PF 1 MG/ML IJ SOLN
INTRAMUSCULAR | Status: DC | PRN
  Administered 2013-12-24: 0.5 mg via INTRAVENOUS
  Administered 2013-12-24: 1 mg via INTRAVENOUS
  Administered 2013-12-24: 0.5 mg via INTRAVENOUS

## 2013-12-24 MED ORDER — LIDOCAINE HCL (CARDIAC) 20 MG/ML IV SOLN
INTRAVENOUS | Status: DC | PRN
  Administered 2013-12-24: 60 mg via INTRAVENOUS

## 2013-12-24 MED ORDER — CEFAZOLIN SODIUM-DEXTROSE 2-3 GM-% IV SOLR
INTRAVENOUS | Status: AC
  Filled 2013-12-24: qty 50

## 2013-12-24 MED ORDER — STERILE WATER FOR IRRIGATION IR SOLN
Status: DC | PRN
  Administered 2013-12-24: 1000 mL

## 2013-12-24 MED ORDER — HYDRALAZINE HCL 20 MG/ML IJ SOLN
INTRAMUSCULAR | Status: AC
  Filled 2013-12-24: qty 1

## 2013-12-24 MED ORDER — ROCURONIUM BROMIDE 100 MG/10ML IV SOLN
INTRAVENOUS | Status: DC | PRN
  Administered 2013-12-24 (×4): 10 mg via INTRAVENOUS
  Administered 2013-12-24: 50 mg via INTRAVENOUS

## 2013-12-24 MED ORDER — HEPARIN SODIUM (PORCINE) 1000 UNIT/ML IJ SOLN
INTRAMUSCULAR | Status: AC
  Filled 2013-12-24: qty 1

## 2013-12-24 MED ORDER — KCL IN DEXTROSE-NACL 20-5-0.45 MEQ/L-%-% IV SOLN
INTRAVENOUS | Status: DC
  Administered 2013-12-24 – 2013-12-25 (×3): via INTRAVENOUS
  Filled 2013-12-24 (×4): qty 1000

## 2013-12-24 MED ORDER — HYDROMORPHONE HCL PF 1 MG/ML IJ SOLN
INTRAMUSCULAR | Status: AC
  Filled 2013-12-24: qty 1

## 2013-12-24 MED ORDER — KCL IN DEXTROSE-NACL 20-5-0.45 MEQ/L-%-% IV SOLN
INTRAVENOUS | Status: AC
  Filled 2013-12-24: qty 1000

## 2013-12-24 MED ORDER — LACTATED RINGERS IV SOLN
INTRAVENOUS | Status: DC
  Administered 2013-12-24: 1000 mL via INTRAVENOUS

## 2013-12-24 MED ORDER — BUPIVACAINE-EPINEPHRINE 0.25% -1:200000 IJ SOLN
INTRAMUSCULAR | Status: DC | PRN
  Administered 2013-12-24: 30 mL

## 2013-12-24 MED ORDER — MEPERIDINE HCL 50 MG/ML IJ SOLN
6.2500 mg | INTRAMUSCULAR | Status: DC | PRN
  Administered 2013-12-24 (×2): 12.5 mg via INTRAVENOUS

## 2013-12-24 MED ORDER — MIDAZOLAM HCL 2 MG/2ML IJ SOLN
INTRAMUSCULAR | Status: AC
  Filled 2013-12-24: qty 2

## 2013-12-24 MED ORDER — SODIUM CHLORIDE 0.9 % IR SOLN
Status: DC | PRN
  Administered 2013-12-24: 300 mL via INTRAVESICAL

## 2013-12-24 MED ORDER — CEFAZOLIN SODIUM 1-5 GM-% IV SOLN
1.0000 g | Freq: Three times a day (TID) | INTRAVENOUS | Status: AC
  Administered 2013-12-24 – 2013-12-25 (×2): 1 g via INTRAVENOUS
  Filled 2013-12-24 (×3): qty 50

## 2013-12-24 MED ORDER — MORPHINE SULFATE 2 MG/ML IJ SOLN
2.0000 mg | INTRAMUSCULAR | Status: DC | PRN
  Administered 2013-12-24 – 2013-12-25 (×2): 2 mg via INTRAVENOUS
  Filled 2013-12-24 (×2): qty 1

## 2013-12-24 MED ORDER — MIDAZOLAM HCL 5 MG/5ML IJ SOLN
INTRAMUSCULAR | Status: DC | PRN
  Administered 2013-12-24: 2 mg via INTRAVENOUS

## 2013-12-24 MED ORDER — NEOSTIGMINE METHYLSULFATE 10 MG/10ML IV SOLN
INTRAVENOUS | Status: DC | PRN
  Administered 2013-12-24: 4 mg via INTRAVENOUS

## 2013-12-24 MED ORDER — HYDRALAZINE HCL 20 MG/ML IJ SOLN
INTRAMUSCULAR | Status: DC | PRN
  Administered 2013-12-24: 4 mg via INTRAVENOUS

## 2013-12-24 SURGICAL SUPPLY — 42 items
ADH SKN CLS APL DERMABOND .7 (GAUZE/BANDAGES/DRESSINGS)
CABLE HIGH FREQUENCY MONO STRZ (ELECTRODE) ×3 IMPLANT
CATH FOLEY 2WAY SLVR 18FR 30CC (CATHETERS) ×3 IMPLANT
CATH ROBINSON RED A/P 16FR (CATHETERS) ×3 IMPLANT
CATH ROBINSON RED A/P 8FR (CATHETERS) ×3 IMPLANT
CATH TIEMANN FOLEY 18FR 5CC (CATHETERS) ×3 IMPLANT
CHLORAPREP W/TINT 26ML (MISCELLANEOUS) ×3 IMPLANT
CLIP LIGATING HEM O LOK PURPLE (MISCELLANEOUS) ×6 IMPLANT
CLOTH BEACON ORANGE TIMEOUT ST (SAFETY) ×3 IMPLANT
COVER SURGICAL LIGHT HANDLE (MISCELLANEOUS) ×3 IMPLANT
COVER TIP SHEARS 8 DVNC (MISCELLANEOUS) ×2 IMPLANT
COVER TIP SHEARS 8MM DA VINCI (MISCELLANEOUS) ×1
CUTTER ECHEON FLEX ENDO 45 340 (ENDOMECHANICALS) ×3 IMPLANT
DECANTER SPIKE VIAL GLASS SM (MISCELLANEOUS) ×3 IMPLANT
DERMABOND ADVANCED (GAUZE/BANDAGES/DRESSINGS)
DERMABOND ADVANCED .7 DNX12 (GAUZE/BANDAGES/DRESSINGS) IMPLANT
DRAPE SURG IRRIG POUCH 19X23 (DRAPES) ×3 IMPLANT
DRSG TEGADERM 4X4.75 (GAUZE/BANDAGES/DRESSINGS) ×3 IMPLANT
DRSG TEGADERM 6X8 (GAUZE/BANDAGES/DRESSINGS) ×6 IMPLANT
ELECT REM PT RETURN 9FT ADLT (ELECTROSURGICAL) ×3
ELECTRODE REM PT RTRN 9FT ADLT (ELECTROSURGICAL) ×2 IMPLANT
GLOVE BIO SURGEON STRL SZ 6.5 (GLOVE) ×3 IMPLANT
GLOVE BIOGEL M STRL SZ7.5 (GLOVE) ×6 IMPLANT
GOWN STRL REUS W/TWL LRG LVL3 (GOWN DISPOSABLE) ×9 IMPLANT
HOLDER FOLEY CATH W/STRAP (MISCELLANEOUS) ×3 IMPLANT
IV LACTATED RINGERS 1000ML (IV SOLUTION) ×3 IMPLANT
KIT ACCESSORY DA VINCI DISP (KITS) ×1
KIT ACCESSORY DVNC DISP (KITS) ×2 IMPLANT
MANIFOLD NEPTUNE II (INSTRUMENTS) ×3 IMPLANT
NDL SAFETY ECLIPSE 18X1.5 (NEEDLE) ×2 IMPLANT
NEEDLE HYPO 18GX1.5 SHARP (NEEDLE) ×3
PACK ROBOT UROLOGY CUSTOM (CUSTOM PROCEDURE TRAY) ×3 IMPLANT
RELOAD GREEN ECHELON 45 (STAPLE) ×3 IMPLANT
SET TUBE IRRIG SUCTION NO TIP (IRRIGATION / IRRIGATOR) ×3 IMPLANT
SOLUTION ELECTROLUBE (MISCELLANEOUS) ×3 IMPLANT
SUT ETHILON 3 0 PS 1 (SUTURE) ×3 IMPLANT
SUT MNCRL AB 4-0 PS2 18 (SUTURE) ×6 IMPLANT
SUT VICRYL 0 UR6 27IN ABS (SUTURE) ×6 IMPLANT
SYR 27GX1/2 1ML LL SAFETY (SYRINGE) ×3 IMPLANT
TOWEL OR 17X26 10 PK STRL BLUE (TOWEL DISPOSABLE) ×3 IMPLANT
TOWEL OR NON WOVEN STRL DISP B (DISPOSABLE) ×3 IMPLANT
WATER STERILE IRR 1500ML POUR (IV SOLUTION) ×6 IMPLANT

## 2013-12-24 NOTE — Anesthesia Preprocedure Evaluation (Addendum)
Anesthesia Evaluation  Patient identified by MRN, date of birth, ID band Patient awake    Reviewed: Allergy & Precautions, H&P , NPO status , Patient's Chart, lab work & pertinent test results  Airway Mallampati: II TM Distance: >3 FB Neck ROM: full    Dental no notable dental hx. (+) Teeth Intact, Dental Advisory Given   Pulmonary neg pulmonary ROS, former smoker,  breath sounds clear to auscultation  Pulmonary exam normal       Cardiovascular Exercise Tolerance: Good + dysrhythmias Atrial Fibrillation Rhythm:regular Rate:Normal     Neuro/Psych Depression OCDnegative neurological ROS  negative psych ROS   GI/Hepatic negative GI ROS, Neg liver ROS, Esophageal stricture   Endo/Other  negative endocrine ROS  Renal/GU negative Renal ROS  negative genitourinary   Musculoskeletal   Abdominal   Peds  Hematology negative hematology ROS (+)   Anesthesia Other Findings   Reproductive/Obstetrics negative OB ROS                         Anesthesia Physical Anesthesia Plan  ASA: III  Anesthesia Plan: General   Post-op Pain Management:    Induction: Intravenous  Airway Management Planned: Oral ETT  Additional Equipment:   Intra-op Plan:   Post-operative Plan: Extubation in OR  Informed Consent: I have reviewed the patients History and Physical, chart, labs and discussed the procedure including the risks, benefits and alternatives for the proposed anesthesia with the patient or authorized representative who has indicated his/her understanding and acceptance.   Dental Advisory Given  Plan Discussed with: CRNA and Surgeon  Anesthesia Plan Comments:         Anesthesia Quick Evaluation

## 2013-12-24 NOTE — Transfer of Care (Signed)
Immediate Anesthesia Transfer of Care Note  Patient: [name] [name]  Procedure(s) Performed: Procedure(s): ROBOTIC ASSISTED LAPAROSCOPIC RADICAL PROSTATECTOMY LEVEL 2 (N/A) LYMPHADENECTOMY (Bilateral)  Patient Location: PACU  Anesthesia Type:General  Level of Consciousness: awake, alert  and oriented  Airway & Oxygen Therapy: Patient Spontanous Breathing and Patient connected to face mask oxygen  Post-op Assessment: Report given to PACU RN and Post -op Vital signs reviewed and stable  Post vital signs: Reviewed and stable  Complications: No apparent anesthesia complications

## 2013-12-24 NOTE — Progress Notes (Signed)
POD#0 RALP & PLND  Doing well subjectively, no complaints other than expected soreness. Tolerating clears currently. Has not gotten OOB.  Filed Vitals:   12/24/13 1430 12/24/13 1438 12/24/13 1445 12/24/13 1456  BP: 142/66  141/77 149/76  Pulse: 85 92 86 87  Temp:   97.4 F (36.3 C) 97.9 F (36.6 C)  TempSrc:      Resp: 14 17 16 14   Height:      Weight:      SpO2: 100% 93% 96% 100%    AAOx3, in NAD Normal WOB Abdomen soft, non-distended, appropriately tender Foley light pink JP serosanguinous Incisions C/D/I  Hemoglobin & Hematocrit     Component Value Date/Time   HGB 14.8 12/24/2013 1354   HCT 42.7 12/24/2013 1354   70M with Gleason 3+4 prostate cancer s/p RALP with bilateral PLND. Doing well. -- advance diet in am and medlock -- encourage PO for pain control -- encouraged oob/ambulation -- anticipate d/c tomorrow with foley

## 2013-12-24 NOTE — Discharge Instructions (Signed)

## 2013-12-24 NOTE — Op Note (Signed)

## 2013-12-24 NOTE — Interval H&P Note (Signed)
History and Physical Interval Note:  12/24/2013 10:00 AM  [name] [name]  has presented today for surgery, with the diagnosis of PROSTATE CANCER  The various methods of treatment have been discussed with the patient and family. After consideration of risks, benefits and other options for treatment, the patient has consented to  Procedure(s): ROBOTIC ASSISTED LAPAROSCOPIC RADICAL PROSTATECTOMY LEVEL 2 (N/A) LYMPHADENECTOMY (Bilateral) as a surgical intervention .  The patient's history has been reviewed, patient examined, no change in status, stable for surgery.  I have reviewed the patient's chart and labs.  Questions were answered to the patient's satisfaction.     BORDEN,LES

## 2013-12-24 NOTE — Anesthesia Postprocedure Evaluation (Signed)
  Anesthesia Post-op Note  Patient: [name] [name]  Procedure(s) Performed: Procedure(s) (LRB): ROBOTIC ASSISTED LAPAROSCOPIC RADICAL PROSTATECTOMY LEVEL 2 (N/A) LYMPHADENECTOMY (Bilateral)  Patient Location: PACU  Anesthesia Type: General  Level of Consciousness: awake and alert   Airway and Oxygen Therapy: Patient Spontanous Breathing  Post-op Pain: mild  Post-op Assessment: Post-op Vital signs reviewed, Patient's Cardiovascular Status Stable, Respiratory Function Stable, Patent Airway and No signs of Nausea or vomiting  Last Vitals:  Filed Vitals:   12/24/13 1445  BP: 141/77  Pulse: 86  Temp:   Resp: 16    Post-op Vital Signs: stable   Complications: No apparent anesthesia complications

## 2013-12-24 NOTE — H&P (Signed)
Chief Complaint Prostate Cancer   Reason For Visit Reason for consult: To discuss treatment options for prostate cancer.  Physician requesting consult: Dr. Franchot [name]  PCP: Dr. Jill [name]   History of Present Illness [name] [name] is a 68 year old gentleman who works as the Games developer at [employer] who was found to have an elevated PSA of 3.93 prompting urologic evaluation by Dr. Diona [name]. He underwent a prostate needle biopsy on 10/14/13 which revealed Gleason 3+4=7 adenocarcinoma with 4 out of 12 biopsy cores positive for malignancy. He has a paternal family history of prostate cancer. His father was apparently diagnosed incidentally with prostate cancer that time he underwent a radical cystectomy for bladder cancer. He lived well into his 66s and did not suffer any symptoms or death related to prostate cancer. His mother also lived into her 1s.    He has a history of paroxysmal atrial fibrillation. This has not required treatment and has only occurred on 2 isolated occasions. He is not followed by a regular cardiologist.    TNM stage: cT1c Nx Mx  PSA: 3.93  Gleason score: 3+4=7  Biopsy (10/14/13): 4/12 cores positive    Left: L lateral apex (80%, 3+4=7, PNI), L apex (30%, 3+3=6), L lateral mid (5%, 3+3=6)    Right: R lateral mid (50%, 3+3=6)  Prostate volume: 25.2 cc  PSAD: 0.16    Nomogram  OC disease: 70%  EPE: 25%  SVI: 4%  LNI: 3%  PFS (surgery): 88% at 5 years, 79% at 10 years    Urinary function: He denies significant baseline voiding symptoms. IPSS is 3.  Erectile function: He denies erectile dysfunction. SHIM score is 25. He is not married and is not currently in a relationship to place his erectile function at the lower priority currently.   Past Medical History Problems  1. History of Anxiety (300.00) 2. History of atrial fibrillation (V12.59) 3. History of depression (V11.8) 4. History of hypertension (V12.59)  Surgical  History Problems  1. History of ENT Surgical Result - Throat Nasopharynx 2. History of Hernia Repair 3. History of Tonsillectomy  Current Meds 1. Aspirin 81 MG Oral Tablet;  Therapy: (Recorded:27May2015) to Recorded 2. Luvox 100 MG TABS;  Therapy: (Recorded:27May2015) to Recorded 3. Multi-Vitamin Oral Tablet;  Therapy: (Recorded:27May2015) to Recorded  Allergies Medication  1. No Known Drug Allergies  Family History Problems  1. Family history of Death of family member : Mother, Father   mother deceased at age 70father deceased at age 12; faulty heart valve 2. Family history of malignant neoplasm of urinary bladder (R15.40) : Father 3. Family history of prostate cancer (G86.76) : Father  Social History Problems    Alcohol use (V49.89)   2   Former smoker [place])   Occupation   Environmental manager   Single  Review of Systems Genitourinary, constitutional, skin, eye, otolaryngeal, hematologic/lymphatic, cardiovascular, pulmonary, endocrine, musculoskeletal, gastrointestinal, neurological and psychiatric system(s) were reviewed and pertinent findings if present are noted.  Musculoskeletal: back pain.  Psychiatric: anxiety and depression.    Vitals Vital Signs [Data Includes: Last 1 Day]  Recorded: 11Aug2015 09:05AM  Blood Pressure: 128 / 80 Heart Rate: 61  Physical Exam Constitutional: Well nourished and well developed . No acute distress.  ENT:. The ears and nose are normal in appearance.  Neck: The appearance of the neck is normal and no neck mass is present.  Pulmonary: No respiratory distress, normal respiratory rhythm and effort and clear bilateral breath sounds.  Cardiovascular: Heart rate and  rhythm are normal . No peripheral edema.  Abdomen: The abdomen is flat. The abdomen is soft and nontender. No masses are palpated. No CVA tenderness. No hernias are palpable. No hepatosplenomegaly noted.  Rectal: Rectal exam demonstrates normal sphincter tone, no  tenderness and no masses. Prostate size is estimated to be 40 g. The prostate has no nodularity and is not tender. The left seminal vesicle is nonpalpable. The right seminal vesicle is nonpalpable. The perineum is normal on inspection.  Lymphatics: The femoral and inguinal nodes are not enlarged or tender.  Skin: Normal skin turgor, no visible rash and no visible skin lesions.  Neuro/Psych:. Mood and affect are appropriate.    Results/Data Selected Results  UA With REFLEX [id number] 02:41PM [name], [name] Main  SPECIMEN TYPE: CLEAN CATCH   Test Name Result Flag Reference  COLOR YELLOW  YELLOW  APPEARANCE CLEAR  CLEAR  SPECIFIC GRAVITY 1.020  1.005-1.030  pH 7.5  5.0-8.0  GLUCOSE NEG mg/dL  NEG  BILIRUBIN NEG  NEG  KETONE NEG mg/dL  NEG  BLOOD NEG  NEG  PROTEIN NEG mg/dL  NEG  UROBILINOGEN 0.2 mg/dL  0.0-1.0  NITRITE NEG  NEG  LEUKOCYTE ESTERASE NEG  NEG    I have reviewed his medical records, PSA results, and pathology report. He was unable to void today.  Assessment Assessed  1. Adenocarcinoma of prostate (185)  Plan Adenocarcinoma of prostate  1. Follow-up Schedule Surgery Office  Follow-up  Status: Complete  Done: [id number] 2. PT/OT Referral Referral  Referral  Status: Hold For - Appointment,PreCert,Date of  Service,Physical Therapy  Requested for: 11Aug2015  Discussion/Summary 1. Clinically localized prostate cancer:   The patient was counseled about the natural history of prostate cancer and the standard treatment options that are available for prostate cancer. It was explained to him how his age and life expectancy, clinical stage, Gleason score, and PSA affect his prognosis, the decision to proceed with additional staging studies, as well as how that information influences recommended treatment strategies. We discussed the roles for active surveillance, radiation therapy, surgical therapy, androgen deprivation, as well as ablative therapy options for the treatment of  prostate cancer as appropriate to his individual cancer situation. We discussed the risks and benefits of these options with regard to their impact on cancer control and also in terms of potential adverse events, complications, and impact on quiality of life particularly related to urinary, bowel, and sexual function. The patient was encouraged to ask questions throughout the discussion today and all questions were answered to his stated satisfaction. In addition, the patient was provided with and/or directed to appropriate resources and literature for further education about prostate cancer and treatment options.   We discussed surgical therapy for prostate cancer including the different available surgical approaches. We discussed, in detail, the risks and expectations of surgery with regard to cancer control, urinary control, and erectile function as well as the expected postoperative recovery process. Additional risks of surgery including but not limited to bleeding, infection, hernia formation, nerve damage, lymphocele formation, bowel/rectal injury potentially necessitating colostomy, damage to the urinary tract resulting in urine leakage, urethral stricture, and the cardiopulmonary risks such as myocardial infarction, stroke, death, venothromboembolism, etc. were explained. The risk of open surgical conversion for robotic/laparoscopic prostatectomy was also discussed.     He feels very well informed and strongly wishes to proceed with surgical therapy. I did discuss proceeding with a radiation oncology consultation which he declines. He feels very comfortable and confident in proceeding with surgical  treatment in places a priority on his quantity of life. He will be scheduled for a bilateral nerve sparing robotic-assisted laparoscopic radical prostatectomy and bilateral pelvic lymphadenectomy.    Cc: Dr. Franchot [name]  Dr. Jill [name]  A total of 65 minutes were spent in the overall care of  the patient today with 48 minutes in direct face to face consultation.    Signatures Electronically signed by : [name] [name], M.D.; Nov 24 2013 10:18AM EST

## 2013-12-24 NOTE — Progress Notes (Signed)
Patient ID: [name] [name], male   DOB: Aug 29, 1945, 68 y.o.   MRN: [id number] Post-op note  Subjective: The patient is doing well.  No complaints.  Objective: Vital signs in last 24 hours: Temp:  [97.4 F (36.3 C)-97.9 F (36.6 C)] 97.9 F (36.6 C) (09/10 1456) Pulse Rate:  [81-92] 87 (09/10 1456) Resp:  [11-20] 14 (09/10 1456) BP: (138-169)/(66-92) 149/76 mmHg (09/10 1456) SpO2:  [93 %-100 %] 100 % (09/10 1456) Weight:  [75.552 kg (166 lb 9 oz)] 75.552 kg (166 lb 9 oz) (09/10 0826)  Intake/Output from previous day:   Intake/Output this shift: Total I/O In: 3000 [I.V.:2000; IV Piggyback:1000] Out: 505 [Urine:350; Drains:55; Blood:100]  Physical Exam:  General: Alert and oriented. Abdomen: Soft, Nondistended. Incisions: Clean and dry. Urine: pink  Lab Results:  Recent Labs  12/24/13 1354  HGB 14.8  HCT 42.7    Assessment/Plan: POD#0   1) Continue to monitor  2) DVT prophy, clears, IS, amb, pain control   LOS: 0 days   Konrad Saha 12/24/2013, 3:36 PM

## 2013-12-25 ENCOUNTER — Encounter (HOSPITAL_COMMUNITY): Payer: Self-pay | Admitting: Urology

## 2013-12-25 LAB — HEMOGLOBIN AND HEMATOCRIT, BLOOD
HCT: 37.6 % — ABNORMAL LOW (ref 39.0–52.0)
Hemoglobin: 13 g/dL (ref 13.0–17.0)

## 2013-12-25 MED ORDER — HYDROCODONE-ACETAMINOPHEN 5-325 MG PO TABS
1.0000 | ORAL_TABLET | Freq: Four times a day (QID) | ORAL | Status: DC | PRN
  Administered 2013-12-25: 1 via ORAL
  Filled 2013-12-25: qty 1

## 2013-12-25 MED ORDER — BISACODYL 10 MG RE SUPP
10.0000 mg | Freq: Once | RECTAL | Status: AC
  Administered 2013-12-25: 10 mg via RECTAL
  Filled 2013-12-25: qty 1

## 2013-12-25 NOTE — Progress Notes (Signed)
1 Day Post-Op Subjective: The patient is doing well.  No nausea or vomiting. Pain is adequately controlled, although required IV pain meds due to hiccups overnight. Ambulated x last night.  Objective: Vital signs in last 24 hours: Temp:  [97.4 F (36.3 C)-97.9 F (36.6 C)] 97.7 F (36.5 C) (09/11 0500) Pulse Rate:  [61-92] 63 (09/11 0500) Resp:  [11-20] 16 (09/11 0500) BP: (117-169)/(66-92) 150/84 mmHg (09/11 0500) SpO2:  [93 %-100 %] 98 % (09/11 0500) Weight:  [75.552 kg (166 lb 9 oz)] 75.552 kg (166 lb 9 oz) (09/10 0826)  Intake/Output from previous day: 09/10 0701 - 09/11 0700 In: 5495 [P.O.:220; I.V.:4175; IV Piggyback:1100] Out: 2075 [Urine:1730; Drains:245; Blood:100] Intake/Output this shift: Total I/O In: 1920 [P.O.:220; I.V.:1650; IV Piggyback:50] Out: 1423 [Urine:1380; Drains:105]  Physical Exam:  General: Alert and oriented. CV: RRR Lungs: Clear bilaterally. GI: Soft, Nondistended. Incisions: Clean, dry, and intact Urine: Clear Extremities: Nontender, no erythema, no edema.  Lab Results:  Recent Labs  12/24/13 1354 12/25/13 0416  HGB 14.8 13.0  HCT 42.7 37.6*      Assessment/Plan: POD# 1 s/p robotic prostatectomy.  1) SL IVF 2) Ambulate, Incentive spirometry 3) Transition to oral pain medication 4) Dulcolax suppository 5) D/C pelvic drain 6) Plan for likely discharge later today

## 2014-01-13 NOTE — Discharge Summary (Signed)
  Date of admission: 12/24/2013  Date of discharge: 12/25/13  Admission diagnosis: Prostate Cancer  Discharge diagnosis: Prostate Cancer  History and Physical: For full details, please see admission history and physical. Briefly, [name] [name] is a 68 y.o. gentleman with localized prostate cancer.  After discussing management/treatment options, he elected to proceed with surgical treatment.  Hospital Course: [name] [name] was taken to the operating room on 12/24/2013 and underwent a robotic assisted laparoscopic radical prostatectomy. He tolerated this procedure well and without complications. Postoperatively, he was able to be transferred to a regular hospital room following recovery from anesthesia.  He was able to begin ambulating the night of surgery. He remained hemodynamically stable overnight.  He had excellent urine output with appropriately minimal output from his pelvic drain and his pelvic drain was removed on POD #1.  He was transitioned to oral pain medication, tolerated a clear liquid diet, and had met all discharge criteria and was able to be discharged home later on POD#1.  Laboratory values: No results found for this basename: HGB, HCT,  in the last 72 hours  Disposition: Home  Discharge instruction: He was instructed to be ambulatory but to refrain from heavy lifting, strenuous activity, or driving. He was instructed on urethral catheter care.  Discharge medications:     Medication List    STOP taking these medications       aspirin 81 MG tablet     clonazePAM 1 MG tablet  Commonly known as:  KLONOPIN     ibuprofen 200 MG tablet  Commonly known as:  ADVIL,MOTRIN     multivitamin with minerals tablet      TAKE these medications       ciprofloxacin 500 MG tablet  Commonly known as:  CIPRO  Take 1 tablet (500 mg total) by mouth 2 (two) times daily. Start day prior to office visit for foley removal     Fluvoxamine Maleate 100 MG Cp24  Commonly known as:  LUVOX CR   Take 1-2 capsules (100-200 mg total) by mouth daily.     HYDROcodone-acetaminophen 5-325 MG per tablet  Commonly known as:  NORCO  Take 1-2 tablets by mouth every 6 (six) hours as needed.        Followup: He will followup in 1 week for catheter removal and to discuss his surgical pathology results.

## 2014-01-22 DIAGNOSIS — R278 Other lack of coordination: Secondary | ICD-10-CM | POA: Diagnosis not present

## 2014-01-22 DIAGNOSIS — M6281 Muscle weakness (generalized): Secondary | ICD-10-CM | POA: Diagnosis not present

## 2014-01-22 DIAGNOSIS — N393 Stress incontinence (female) (male): Secondary | ICD-10-CM | POA: Diagnosis not present

## 2014-02-05 DIAGNOSIS — N393 Stress incontinence (female) (male): Secondary | ICD-10-CM | POA: Diagnosis not present

## 2014-02-05 DIAGNOSIS — R278 Other lack of coordination: Secondary | ICD-10-CM | POA: Diagnosis not present

## 2014-02-05 DIAGNOSIS — M6281 Muscle weakness (generalized): Secondary | ICD-10-CM | POA: Diagnosis not present

## 2014-02-19 DIAGNOSIS — N393 Stress incontinence (female) (male): Secondary | ICD-10-CM | POA: Diagnosis not present

## 2014-02-19 DIAGNOSIS — M6281 Muscle weakness (generalized): Secondary | ICD-10-CM | POA: Diagnosis not present

## 2014-02-19 DIAGNOSIS — R278 Other lack of coordination: Secondary | ICD-10-CM | POA: Diagnosis not present

## 2014-03-08 DIAGNOSIS — R278 Other lack of coordination: Secondary | ICD-10-CM | POA: Diagnosis not present

## 2014-03-08 DIAGNOSIS — M6281 Muscle weakness (generalized): Secondary | ICD-10-CM | POA: Diagnosis not present

## 2014-03-08 DIAGNOSIS — N393 Stress incontinence (female) (male): Secondary | ICD-10-CM | POA: Diagnosis not present

## 2014-03-09 DIAGNOSIS — H40033 Anatomical narrow angle, bilateral: Secondary | ICD-10-CM | POA: Diagnosis not present

## 2014-04-01 DIAGNOSIS — N393 Stress incontinence (female) (male): Secondary | ICD-10-CM | POA: Diagnosis not present

## 2014-04-01 DIAGNOSIS — M6281 Muscle weakness (generalized): Secondary | ICD-10-CM | POA: Diagnosis not present

## 2014-04-01 DIAGNOSIS — R278 Other lack of coordination: Secondary | ICD-10-CM | POA: Diagnosis not present

## 2014-05-06 DIAGNOSIS — R278 Other lack of coordination: Secondary | ICD-10-CM | POA: Diagnosis not present

## 2014-05-06 DIAGNOSIS — N393 Stress incontinence (female) (male): Secondary | ICD-10-CM | POA: Diagnosis not present

## 2014-05-06 DIAGNOSIS — M6281 Muscle weakness (generalized): Secondary | ICD-10-CM | POA: Diagnosis not present

## 2014-06-18 ENCOUNTER — Telehealth: Payer: Self-pay | Admitting: Internal Medicine

## 2014-06-18 DIAGNOSIS — F429 Obsessive-compulsive disorder, unspecified: Secondary | ICD-10-CM

## 2014-06-18 MED ORDER — FLUVOXAMINE MALEATE ER 100 MG PO CP24: 100.0000 mg | ORAL_CAPSULE | Freq: Every day | ORAL | Status: DC

## 2014-06-18 NOTE — Telephone Encounter (Signed)
Refill request for luvox to [facility] [address]

## 2014-06-18 NOTE — Telephone Encounter (Signed)
I have called patient left message to please call znd make an med check appointment

## 2014-06-18 NOTE — Telephone Encounter (Signed)
Have him set up a med check appointment 

## 2014-07-19 DIAGNOSIS — C61 Malignant neoplasm of prostate: Secondary | ICD-10-CM | POA: Diagnosis not present

## 2014-07-19 DIAGNOSIS — N393 Stress incontinence (female) (male): Secondary | ICD-10-CM | POA: Diagnosis not present

## 2014-07-19 DIAGNOSIS — N529 Male erectile dysfunction, unspecified: Secondary | ICD-10-CM | POA: Diagnosis not present

## 2014-08-03 ENCOUNTER — Encounter: Payer: Self-pay | Admitting: Family Medicine

## 2014-08-03 ENCOUNTER — Ambulatory Visit (INDEPENDENT_AMBULATORY_CARE_PROVIDER_SITE_OTHER): Payer: BC Managed Care – PPO | Admitting: Family Medicine

## 2014-08-03 VITALS — BP 130/90 | HR 70 | Ht 70.5 in | Wt 167.0 lb

## 2014-08-03 DIAGNOSIS — K222 Esophageal obstruction: Secondary | ICD-10-CM | POA: Diagnosis not present

## 2014-08-03 DIAGNOSIS — F429 Obsessive-compulsive disorder, unspecified: Secondary | ICD-10-CM

## 2014-08-03 DIAGNOSIS — F42 Obsessive-compulsive disorder: Secondary | ICD-10-CM

## 2014-08-03 DIAGNOSIS — E785 Hyperlipidemia, unspecified: Secondary | ICD-10-CM | POA: Diagnosis not present

## 2014-08-03 DIAGNOSIS — N200 Calculus of kidney: Secondary | ICD-10-CM

## 2014-08-03 DIAGNOSIS — Z8546 Personal history of malignant neoplasm of prostate: Secondary | ICD-10-CM

## 2014-08-03 DIAGNOSIS — Z0000 Encounter for general adult medical examination without abnormal findings: Secondary | ICD-10-CM | POA: Diagnosis not present

## 2014-08-03 LAB — LIPID PANEL
Cholesterol: 236 mg/dL — ABNORMAL HIGH (ref 0–200)
HDL: 68 mg/dL (ref >=40)
LDL Cholesterol: 110 mg/dL — ABNORMAL HIGH (ref 0–99)
Total CHOL/HDL Ratio: 3.5 Ratio
Triglycerides: 292 mg/dL — ABNORMAL HIGH (ref <150)
VLDL: 58 mg/dL — ABNORMAL HIGH (ref 0–40)

## 2014-08-03 MED ORDER — FLUVOXAMINE MALEATE ER 100 MG PO CP24: 100.0000 mg | ORAL_CAPSULE | Freq: Every day | ORAL | Status: DC

## 2014-08-03 NOTE — Progress Notes (Signed)
   Subjective:    Patient ID: [name] [name], male    DOB: [birth year], 70 y.o.   MRN: [id number]  HPI He is here for complete examination. He recently underwent robotic surgery for treatment of prostate cancer. He had a Gleason score of 7. Presently he is still having erectile dysfunction but apparently good bladder control. He is pleased with the progress that he has made. His last PSA was apparently undetectable. Does have symptoms of esophageal stricture. He is now chewing his food up better which has helped. He does occasionally use Nexium but is concerned about possible side effects. He is on Luvox. He has a previous history of stones but no difficulty with that recently. He has no other concerns or complaints. Family and social history as well as health maintenance was reviewed.   Review of Systems  All other systems reviewed and are negative.      Objective:   Physical Exam BP 130/90 mmHg  Pulse 70  Ht 5' 10.5" (1.791 m)  Wt 167 lb (75.751 kg)  BMI 23.62 kg/m2  SpO2 98%  General Appearance:    Alert, cooperative, no distress, appears stated age  Head:    Normocephalic, without obvious abnormality, atraumatic  Eyes:    PERRL, conjunctiva/corneas clear, EOM's intact, fundi    benign  Ears:    Normal TM's and external ear canals  Nose:   Nares normal, mucosa normal, no drainage or sinus   tenderness  Throat:   Lips, mucosa, and tongue normal; teeth and gums normal  Neck:   Supple, no lymphadenopathy;  thyroid:  no   enlargement/tenderness/nodules; no carotid   bruit or JVD  Back:    Spine nontender, no curvature, ROM normal, no CVA     tenderness  Lungs:     Clear to auscultation bilaterally without wheezes, rales or     ronchi; respirations unlabored  Chest Wall:    No tenderness or deformity   Heart:    Regular rate and rhythm, S1 and S2 normal, no murmur, rub   or gallop  Breast Exam:    No chest wall tenderness, masses or gynecomastia  Abdomen:     Soft, non-tender,  nondistended, normoactive bowel sounds,    no masses, no hepatosplenomegaly        Extremities:   No clubbing, cyanosis or edema  Pulses:   2+ and symmetric all extremities  Skin:   Skin color, texture, turgor normal, no rashes or lesions  Lymph nodes:   Cervical, supraclavicular, and axillary nodes normal  Neurologic:   CNII-XII intact, normal strength, sensation and gait; reflexes 2+ and symmetric throughout          Psych:   Normal mood, affect, hygiene and grooming.          Assessment & Plan:  Routine general medical examination at a health care facility  Stricture and stenosis of esophagus  History of prostate cancer  Hyperlipidemia LDL goal <130 - Plan: Lipid panel  OCD (obsessive compulsive disorder) - Plan: Fluvoxamine Maleate (LUVOX CR) 100 MG CP24  Renal stone explained that if he continues have difficulty with swallowing, referral to GI will need to be made. He will continue to follow-up with his urologist. His Luvox was renewed. He has a good handle on how to handle a stone if this occurs again.

## 2014-08-05 DIAGNOSIS — L638 Other alopecia areata: Secondary | ICD-10-CM | POA: Diagnosis not present

## 2014-08-05 DIAGNOSIS — L821 Other seborrheic keratosis: Secondary | ICD-10-CM | POA: Diagnosis not present

## 2014-09-10 ENCOUNTER — Encounter: Payer: Self-pay | Admitting: Family Medicine

## 2014-09-10 ENCOUNTER — Ambulatory Visit (INDEPENDENT_AMBULATORY_CARE_PROVIDER_SITE_OTHER): Payer: BC Managed Care – PPO | Admitting: Family Medicine

## 2014-09-10 VITALS — BP 150/100 | HR 84 | Temp 97.8°F | Ht 70.0 in | Wt 167.2 lb

## 2014-09-10 DIAGNOSIS — J069 Acute upper respiratory infection, unspecified: Secondary | ICD-10-CM | POA: Diagnosis not present

## 2014-09-10 NOTE — Progress Notes (Signed)
Chief Complaint  Patient presents with  . Cough    started earlier this week, usually feels better by now when he has a cold so he is concerned. No discolored mucus, only slight blood streaked mucus this am. No fever, chills or body aches.    4 days ago he started with sneezing, postnasal drainage.  He has been blowing his nose like crazy, all clear mucus. Yesterday he started coughing, kept him awake last night.  He noticed blood in the mucus starting today. Only noticed it this morning, not later in the day.  He has had some coughing this morning, which caused chest pain from the effort.   Denies ear pain, sore throat, sinus pain or headaches.  He started the Claritin D 4 days ago, and it seems to help (taking 12 hour version, BID).  Some dry mouth. Denies any shortness of breath. Hasn't taken any cough medication  PMH, PSH, SH reviewed.  Outpatient Encounter Prescriptions as of 09/10/2014  Medication Sig Note  . aspirin 81 MG tablet Take 81 mg by mouth daily.   . clobetasol (TEMOVATE) 0.05 % external solution Apply 1 application topically 2 (two) times daily.  09/10/2014: Received from: External Pharmacy  . Fluvoxamine Maleate (LUVOX CR) 100 MG CP24 Take 1-2 capsules (100-200 mg total) by mouth daily.   Marland Kitchen loratadine-pseudoephedrine (CLARITIN-D 12-HOUR) 5-120 MG per tablet Take 1 tablet by mouth 2 (two) times daily.   . Multiple Vitamins-Minerals (MULTIVITAMIN WITH MINERALS) tablet Take 1 tablet by mouth daily.    No facility-administered encounter medications on file as of 09/10/2014.   No Known Allergies  ROS: No known fevers, chills, headaches, dizziness, chest pain, shortness of breath, nausea, vomiting, diarrhea, bleeding (other than nasal bleeding this morning), bruising, rashes. See HPI  PHYSICAL EXAM: BP 150/100 mmHg  Pulse 84  Temp(Src) 97.8 F (36.6 C) (Tympanic)  Ht 5\' 10"  (1.778 m)  Wt 167 lb 3.2 oz (75.841 kg)  BMI 23.99 kg/m2  Well developed, pleasant male, in no  distress.  No coughing or sniffling. HEENT: PERRL, EOMI, conjunctiva clear.  TM's and EAC's normal.  OP is notable for white mucus draining down the posterior OP, R>L.  Mild erythema.  No tonsils. Moist mucus membranes Nasal mucosa is mildly edematous with clear mucus noted on the left. Sinuses are nontender Neck: no lymphadenopathy, thyromegaly or mass Heart: regular rate and rhythm without murmur Lungs: clear bilaterally Skin: no rash Neuro: alert and oriented.  Cranial nerves intact, normal gait  ASSESSMENT/PLAN:  Acute upper respiratory infection  Drink plenty of water. Continue the claritin-D--don't use it longterm though, as it is elevating your blood pressure.  Okay for short-term use for this illness. Start using Mucinex-DM (expectorant is guaifenesin, which will thin out the mucus; DM is a cough suppressant--dextromethorphan--which should help decrease the cough, and improve your sleep).  Return or call if you develop fevers, discolored mucus, sinus pain. Return for re-evaluation if worsening cough, shortness of breath, chest pain  Use tylenol or ibuprofen as needed for pain or fever. Get plenty of rest.

## 2014-09-10 NOTE — Patient Instructions (Signed)
   Drink plenty of water. Continue the claritin-D--don't use it longterm though, as it is elevating your blood pressure.  Okay for short-term use for this illness. Start using Mucinex-DM (expectorant is guaifenesin, which will thin out the mucus; DM is a cough suppressant--dextromethorphan--which should help decrease the cough, and improve your sleep).  Return or call if you develop fevers, discolored mucus, sinus pain. Return for re-evaluation if worsening cough, shortness of breath, chest pain  Use tylenol or ibuprofen as needed for pain or fever. Get plenty of rest.

## 2014-09-16 DIAGNOSIS — L638 Other alopecia areata: Secondary | ICD-10-CM | POA: Diagnosis not present

## 2014-10-11 ENCOUNTER — Other Ambulatory Visit: Payer: Self-pay

## 2015-01-12 DIAGNOSIS — N393 Stress incontinence (female) (male): Secondary | ICD-10-CM | POA: Diagnosis not present

## 2015-01-12 DIAGNOSIS — C61 Malignant neoplasm of prostate: Secondary | ICD-10-CM | POA: Diagnosis not present

## 2015-01-12 DIAGNOSIS — N529 Male erectile dysfunction, unspecified: Secondary | ICD-10-CM | POA: Diagnosis not present

## 2015-05-05 ENCOUNTER — Emergency Department (HOSPITAL_COMMUNITY): Payer: BC Managed Care – PPO

## 2015-05-05 ENCOUNTER — Emergency Department (HOSPITAL_COMMUNITY)
Admission: EM | Admit: 2015-05-05 | Discharge: 2015-05-05 | Disposition: A | Discharge status: Home / Self Care | Payer: BC Managed Care – PPO | Attending: Emergency Medicine | Admitting: Emergency Medicine

## 2015-05-05 ENCOUNTER — Encounter (HOSPITAL_COMMUNITY): Payer: Self-pay | Admitting: *Deleted

## 2015-05-05 DIAGNOSIS — N23 Unspecified renal colic: Secondary | ICD-10-CM | POA: Insufficient documentation

## 2015-05-05 DIAGNOSIS — Z87891 Personal history of nicotine dependence: Secondary | ICD-10-CM | POA: Diagnosis not present

## 2015-05-05 DIAGNOSIS — N132 Hydronephrosis with renal and ureteral calculous obstruction: Secondary | ICD-10-CM | POA: Diagnosis not present

## 2015-05-05 DIAGNOSIS — F429 Obsessive-compulsive disorder, unspecified: Secondary | ICD-10-CM | POA: Insufficient documentation

## 2015-05-05 DIAGNOSIS — Z8679 Personal history of other diseases of the circulatory system: Secondary | ICD-10-CM | POA: Insufficient documentation

## 2015-05-05 DIAGNOSIS — Z7982 Long term (current) use of aspirin: Secondary | ICD-10-CM | POA: Insufficient documentation

## 2015-05-05 DIAGNOSIS — R112 Nausea with vomiting, unspecified: Secondary | ICD-10-CM

## 2015-05-05 DIAGNOSIS — Z8639 Personal history of other endocrine, nutritional and metabolic disease: Secondary | ICD-10-CM | POA: Insufficient documentation

## 2015-05-05 DIAGNOSIS — R319 Hematuria, unspecified: Secondary | ICD-10-CM

## 2015-05-05 DIAGNOSIS — N200 Calculus of kidney: Secondary | ICD-10-CM | POA: Diagnosis not present

## 2015-05-05 DIAGNOSIS — Z79899 Other long term (current) drug therapy: Secondary | ICD-10-CM | POA: Insufficient documentation

## 2015-05-05 DIAGNOSIS — N179 Acute kidney failure, unspecified: Secondary | ICD-10-CM | POA: Insufficient documentation

## 2015-05-05 DIAGNOSIS — R1033 Periumbilical pain: Secondary | ICD-10-CM | POA: Diagnosis not present

## 2015-05-05 DIAGNOSIS — R109 Unspecified abdominal pain: Secondary | ICD-10-CM | POA: Diagnosis present

## 2015-05-05 DIAGNOSIS — Z8719 Personal history of other diseases of the digestive system: Secondary | ICD-10-CM | POA: Insufficient documentation

## 2015-05-05 DIAGNOSIS — Z7952 Long term (current) use of systemic steroids: Secondary | ICD-10-CM | POA: Insufficient documentation

## 2015-05-05 DIAGNOSIS — Z8546 Personal history of malignant neoplasm of prostate: Secondary | ICD-10-CM | POA: Insufficient documentation

## 2015-05-05 LAB — CBC
HCT: 48 % (ref 39.0–52.0)
Hemoglobin: 16.2 g/dL (ref 13.0–17.0)
MCH: 31.5 pg (ref 26.0–34.0)
MCHC: 33.8 g/dL (ref 30.0–36.0)
MCV: 93.4 fL (ref 78.0–100.0)
Platelets: 177 10*3/uL (ref 150–400)
RBC: 5.14 MIL/uL (ref 4.22–5.81)
RDW: 12.9 % (ref 11.5–15.5)
WBC: 8.6 10*3/uL (ref 4.0–10.5)

## 2015-05-05 LAB — BASIC METABOLIC PANEL
Anion gap: 14 (ref 5–15)
BUN: 21 mg/dL — ABNORMAL HIGH (ref 6–20)
CO2: 20 mmol/L — ABNORMAL LOW (ref 22–32)
Calcium: 9.5 mg/dL (ref 8.9–10.3)
Chloride: 105 mmol/L (ref 101–111)
Creatinine, Ser: 1.33 mg/dL — ABNORMAL HIGH (ref 0.61–1.24)
GFR calc Af Amer: >60 mL/min (ref >60)
GFR calc non Af Amer: 54 mL/min — ABNORMAL LOW (ref >60)
Glucose, Bld: 155 mg/dL — ABNORMAL HIGH (ref 65–99)
Potassium: 4 mmol/L (ref 3.5–5.1)
Sodium: 139 mmol/L (ref 135–145)

## 2015-05-05 LAB — URINE MICROSCOPIC-ADD ON

## 2015-05-05 LAB — URINALYSIS, ROUTINE W REFLEX MICROSCOPIC
Bilirubin Urine: NEGATIVE
Glucose, UA: NEGATIVE mg/dL
Ketones, ur: 15 mg/dL — AB
Leukocytes, UA: NEGATIVE
Nitrite: NEGATIVE
Protein, ur: 30 mg/dL — AB
Specific Gravity, Urine: 1.028 (ref 1.005–1.030)
pH: 7 (ref 5.0–8.0)

## 2015-05-05 MED ORDER — SODIUM CHLORIDE 0.9 % IV BOLUS (SEPSIS)
1000.0000 mL | Freq: Once | INTRAVENOUS | Status: AC
  Administered 2015-05-05: 1000 mL via INTRAVENOUS

## 2015-05-05 MED ORDER — ONDANSETRON 4 MG PO TBDP
4.0000 mg | ORAL_TABLET | Freq: Three times a day (TID) | ORAL | Status: DC | PRN
Start: 2015-05-05 — End: 2016-08-24

## 2015-05-05 MED ORDER — ONDANSETRON HCL 4 MG/2ML IJ SOLN
4.0000 mg | Freq: Once | INTRAMUSCULAR | Status: AC
  Administered 2015-05-05: 4 mg via INTRAVENOUS
  Filled 2015-05-05: qty 2

## 2015-05-05 MED ORDER — TAMSULOSIN HCL 0.4 MG PO CAPS: 0.4000 mg | ORAL_CAPSULE | Freq: Every day | ORAL | Status: DC

## 2015-05-05 MED ORDER — OXYCODONE-ACETAMINOPHEN 5-325 MG PO TABS: 1.0000 | ORAL_TABLET | Freq: Four times a day (QID) | ORAL | Status: DC | PRN

## 2015-05-05 MED ORDER — MORPHINE SULFATE (PF) 4 MG/ML IV SOLN
4.0000 mg | Freq: Once | INTRAVENOUS | Status: AC
  Administered 2015-05-05: 4 mg via INTRAVENOUS
  Filled 2015-05-05: qty 1

## 2015-05-05 NOTE — ED Notes (Signed)
Pt complains of back spasms in radiating from his lower back to his left upper back, nausea, vomiting for the past hour. Pt denies urinary symptoms. Pt states he took hydrocodone 1 hour ago, which he states has not helped.

## 2015-05-05 NOTE — Discharge Instructions (Signed)
Take ibuprofen as directed as needed for pain using percocet for breakthrough pain. Do not drive or operate machinery with pain medication use. May need over-the-counter stool softener with this pain medication use. Use Zofran as needed for nausea. Stay very well hydrated with plenty of water to encourage your kidney stone to pass. Use Flomax as directed, as this medication will help you pass the stone. Strain your urine to see if you can catch the kidney stone when it passes. Followup with your urologist in the next 1 week for recheck of ongoing pain, however for intractable or uncontrollable pain at home then return to the [facility] emergency department.    Renal Colic Renal colic is pain that is caused by a kidney stone. HOME CARE  Take medicines only as told by your doctor.  Ask your doctor if it is okay to take over-the-counter medicine for pain.  Drink enough fluid to keep your pee (urine) clear or pale yellow. Drink 6-8 glasses of water each day.  Eat less than 2 grams of salt per day.  Eat less protein. Some foods that have protein are meats, fish, nuts, and dairy.  Try not to eat spinach, rhubarb, nuts, or bran. GET HELP IF:  You have a fever or chills.  Your pee smells bad or looks cloudy.  You have pain or burning when you pee. GET HELP RIGHT AWAY IF:  The pain in your side (flank) or your groin suddenly gets worse.  You get confused.  You pass out.   This information is not intended to replace advice given to you by your health care provider. Make sure you discuss any questions you have with your health care provider.   Document Released: 09/19/2007 Document Revised: 04/23/2014 Document Reviewed: 02/10/2014 Elsevier Interactive Patient Education 2016 [place].  Kidney Stones Kidney stones (urolithiasis) are solid masses that form inside your kidneys. The intense pain is caused by the stone moving through the kidney, ureter, bladder, and urethra (urinary  tract). When the stone moves, the ureter starts to spasm around the stone. The stone is usually passed in your pee (urine).  HOME CARE  Drink enough fluids to keep your pee clear or pale yellow. This helps to get the stone out.  Take a 24-hour pee (urine) sample as told by your doctor. You may need to take another sample every 6-12 months.  Strain all pee through the provided strainer. Do not pee without peeing through the strainer, not even once. If you pee the stone out, catch it in the strainer. The stone may be as small as a grain of salt. Take this to your doctor. This will help your doctor figure out what you can do to try to prevent more kidney stones.  Only take medicine as told by your doctor.  Make changes to your daily diet as told by your doctor. You may be told to:  Limit how much salt you eat.  Eat 5 or more servings of fruits and vegetables each day.  Limit how much meat, poultry, fish, and eggs you eat.  Keep all follow-up visits as told by your doctor. This is important.  Get follow-up X-rays as told by your doctor. GET HELP IF: You have pain that gets worse even if you have been taking pain medicine. GET HELP RIGHT AWAY IF:   Your pain does not get better with medicine.  You have a fever or shaking chills.  Your pain increases and gets worse over 18 hours.  You have new belly (abdominal) pain.  You feel faint or pass out.  You are unable to pee.   This information is not intended to replace advice given to you by your health care provider. Make sure you discuss any questions you have with your health care provider.   Document Released: 09/19/2007 Document Revised: 12/22/2014 Document Reviewed: 09/03/2012 Elsevier Interactive Patient Education 2016 [place] Guidelines to Help Prevent Kidney Stones Your risk of kidney stones can be decreased by adjusting the foods you eat. The most important thing you can do is drink enough fluid. You  should drink enough fluid to keep your urine clear or pale yellow. The following guidelines provide specific information for the type of kidney stone you have had. GUIDELINES ACCORDING TO TYPE OF KIDNEY STONE Calcium Oxalate Kidney Stones  Reduce the amount of salt you eat. Foods that have a lot of salt cause your body to release excess calcium into your urine. The excess calcium can combine with a substance called oxalate to form kidney stones.  Reduce the amount of animal protein you eat if the amount you eat is excessive. Animal protein causes your body to release excess calcium into your urine. Ask your dietitian how much protein from animal sources you should be eating.  Avoid foods that are high in oxalates. If you take vitamins, they should have less than 500 mg of vitamin C. Your body turns vitamin C into oxalates. You do not need to avoid fruits and vegetables high in vitamin C. Calcium Phosphate Kidney Stones  Reduce the amount of salt you eat to help prevent the release of excess calcium into your urine.  Reduce the amount of animal protein you eat if the amount you eat is excessive. Animal protein causes your body to release excess calcium into your urine. Ask your dietitian how much protein from animal sources you should be eating.  Get enough calcium from food or take a calcium supplement (ask your dietitian for recommendations). Food sources of calcium that do not increase your risk of kidney stones include:  Broccoli.  Dairy products, such as cheese and yogurt.  Pudding. Uric Acid Kidney Stones  Do not have more than 6 oz of animal protein per day. FOOD SOURCES Animal Protein Sources  Meat (all types).  Poultry.  Eggs.  Fish, seafood. Foods High in [employer] seasonings.  Soy sauce.  Teriyaki sauce.  Cured and processed meats.  Salted crackers and snack foods.  Fast food.  Canned soups and most canned foods. Foods High in  Oxalates  Grains:  Amaranth.  Barley.  Grits.  Wheat germ.  Bran.  Buckwheat flour.  All bran cereals.  Pretzels.  Whole wheat bread.  Vegetables:  Beans (wax).  Beets and beet greens.  Collard greens.  Eggplant.  [place].  Leeks.  Okra.  Parsley.  [place]abagas.  Spinach.  Swiss chard.  Tomato paste.  Fried potatoes.  Sweet potatoes.  Fruits:  Red currants.  Figs.  Kiwi.  Rhubarb.  Meat and Other Protein Sources:  Beans (dried).  Soy burgers and other soybean products.  Miso.  Nuts (peanuts, almonds, pecans, cashews, hazelnuts).  Nut butters.  Sesame seeds and tahini (paste made of sesame seeds).  Poppy seeds.  Beverages:  Chocolate drink mixes.  Soy milk.  Instant iced tea.  Juices made from high-oxalate fruits or vegetables.  Other:  Carob.  Chocolate.  Fruitcake.  Marmalades.   This information is not intended to replace advice  given to you by your health care provider. Make sure you discuss any questions you have with your health care provider.   Document Released: 07/28/2010 Document Revised: 04/07/2013 Document Reviewed: 02/27/2013 Elsevier Interactive Patient Education 2016 Elsevier Inc.  Nausea and Vomiting Nausea means you feel sick to your stomach. Throwing up (vomiting) is a reflex where stomach contents come out of your mouth. HOME CARE   Take medicine as told by your doctor.  Do not force yourself to eat. However, you do need to drink fluids.  If you feel like eating, eat a normal diet as told by your doctor.  Eat rice, wheat, potatoes, bread, lean meats, yogurt, fruits, and vegetables.  Avoid high-fat foods.  Drink enough fluids to keep your pee (urine) clear or pale yellow.  Ask your doctor how to replace body fluid losses (rehydrate). Signs of body fluid loss (dehydration) include:  Feeling very thirsty.  Dry lips and mouth.  Feeling dizzy.  Dark pee.  Peeing less than  normal.  Feeling confused.  Fast breathing or heart rate. GET HELP RIGHT AWAY IF:   You have blood in your throw up.  You have black or bloody poop (stool).  You have a bad headache or stiff neck.  You feel confused.  You have bad belly (abdominal) pain.  You have chest pain or trouble breathing.  You do not pee at least once every 8 hours.  You have cold, clammy skin.  You keep throwing up after 24 to 48 hours.  You have a fever. MAKE SURE YOU:   Understand these instructions.  Will watch your condition.  Will get help right away if you are not doing well or get worse.   This information is not intended to replace advice given to you by your health care provider. Make sure you discuss any questions you have with your health care provider.   Document Released: 09/19/2007 Document Revised: 06/25/2011 Document Reviewed: 09/01/2010 Elsevier Interactive Patient Education [employer].

## 2015-05-05 NOTE — ED Provider Notes (Signed)
The patient is a 70 year old male, he has a known history of a single kidney stone in the past over 10 years ago, he presents with a complaint of sharp and stabbing pain to the left flank that occurred this morning, associated nausea and vomiting 5, mild hematuria, no fevers, pain is waxing and waning and does not seem to be positional. On exam he has soft nontender abdomen, appears well otherwise. Urinalysis shows hematuria, renal insufficiency is mild and new, IV fluids and pain medications given, the patient can follow-up in the outpatient setting. He has been informed of all of his results and the indications for return, he will be given both written instructions as well as the verbal instructions that I everted given him. He expresses his understanding. He appears stable for discharge after pain control.  Medical screening examination/treatment/procedure(s) were conducted as a shared visit with non-physician practitioner(s) and myself.  I personally evaluated the patient during the encounter.  Clinical Impression:   Final diagnoses:  Left flank pain  Nephrolithiasis  Renal colic  Non-intractable vomiting with nausea, vomiting of unspecified type  AKI (acute kidney injury) ([place])  Hematuria         Noemi Chapel, MD 05/06/15 2345

## 2015-05-05 NOTE — ED Provider Notes (Signed)
CSN: [id number]     Arrival date & time 05/05/15  1116 History   First MD Initiated Contact with Patient 05/05/15 1503     Chief Complaint  Patient presents with  . Flank Pain     (Consider location/radiation/quality/duration/timing/severity/associated sxs/prior Treatment) HPI Comments: [name] [name] is a 70 y.o. male with a PMHx of nephrolithiasis, diverticulosis, vitamin D deficiency, aflutter, prostate CA s/p radical prostatectomy, and HLD, who presents to the ED with complaints of sudden onset left flank pain that began at 10:30 AM. He describes his pain as 4/10 constant sharp waxing and waning left flank pain radiating to the suprapubic area of his abdomen, with no known aggravating factors, and unrelieved with Vicodin and Advil taken prior to arrival. Associated symptoms include 5 episodes of nonbloody nonbilious emesis and nausea. He states this feels very similar to his prior kidney stones, states that the past he has needed kidney stone removal surgery.  he denies any fevers, chills, chest pain, shortness breath, diarrhea, constipation, obstipation, melena, hematochezia, hematemesis, dysuria, hematuria, increased urinary frequency or urgency, penile discharge, testicular pain or swelling, numbness, tingling, weakness, or incontinence of urine or stool.  Patient is a 70 y.o. male presenting with flank pain. The history is provided by the patient. No language interpreter was used.  Flank Pain This is a recurrent problem. The current episode started today. The problem occurs constantly. The problem has been waxing and waning. Associated symptoms include abdominal pain (radiating from L flank), nausea and vomiting. Pertinent negatives include no arthralgias, chest pain, chills, fever, myalgias, numbness, urinary symptoms or weakness. Nothing aggravates the symptoms. He has tried oral narcotics and NSAIDs for the symptoms. The treatment provided no relief.    Past Medical History  Diagnosis  Date  . Depression     and OCD  . Diverticulosis of colon   . Unspecified vitamin D deficiency   . Elevated troponin 06/15/2012    hx of type 2 elevated troponin secondary to atrial fib  . H/O atrial flutter 06/15/2012    resolved  . Hyperlipidemia 06/15/2012    mild  . Borderline high cholesterol   . Obsessive compulsive disorder   . Depression   . Cancer ([place]) 11/25/13    prostate CA  . Dysrhythmia     HX ATRIAL FIB AND FLUTTER - PT STATES IT HAS HAPPEN IN [place] - LAST TIME WAS JULY 2015- PT IS NOT ANY ANY MEDICIATON FOR ATRIAL FIB  . History of kidney stones    Past Surgical History  Procedure Laterality Date  . Colonoscopy  2009    Dr. Deatra Ina  . Tonsillectomy    . Wisdom tooth extraction    . Hernia repair      AS A CHILD  . Cosmetic surgery on ears - local in doctor's office    . Esophageal dilation    . Robot assisted laparoscopic radical prostatectomy N/A 12/24/2013    Procedure: ROBOTIC ASSISTED LAPAROSCOPIC RADICAL PROSTATECTOMY LEVEL 2;  Surgeon: Raynelle Bring, MD;  Location: [facility];  Service: Urology;  Laterality: N/A;  . Lymphadenectomy Bilateral 12/24/2013    Procedure: LYMPHADENECTOMY;  Surgeon: Raynelle Bring, MD;  Location: [facility];  Service: Urology;  Laterality: Bilateral;   No family history on file. Social History  Substance Use Topics  . Smoking status: Former Smoker    Quit date: 04/16/2005  . Smokeless tobacco: Never Used  . Alcohol Use: 8.4 oz/week    14 Glasses of wine per week     Comment:  1-2glasses of wine per night    Review of Systems  Constitutional: Negative for fever and chills.  Respiratory: Negative for shortness of breath.   Cardiovascular: Negative for chest pain.  Gastrointestinal: Positive for nausea, vomiting and abdominal pain (radiating from L flank). Negative for diarrhea, constipation and blood in stool.  Genitourinary: Positive for flank pain. Negative for dysuria, frequency, hematuria, discharge, scrotal swelling and  testicular pain.  Musculoskeletal: Negative for myalgias and arthralgias.  Skin: Negative for color change.  Allergic/Immunologic: Negative for immunocompromised state.  Neurological: Negative for weakness and numbness.  Psychiatric/Behavioral: Negative for confusion.   10 Systems reviewed and are negative for acute change except as noted in the HPI.    Allergies  Review of patient's allergies indicates no known allergies.  Home Medications   Prior to Admission medications   Medication Sig Start Date End Date Taking? Authorizing Provider  aspirin 81 MG tablet Take 81 mg by mouth daily.    Historical Provider, MD  clobetasol (TEMOVATE) 0.05 % external solution Apply 1 application topically 2 (two) times daily.  06/24/14   Historical Provider, MD  Fluvoxamine Maleate (LUVOX CR) 100 MG CP24 Take 1-2 capsules (100-200 mg total) by mouth daily. 08/03/14   Denita Lung, MD  loratadine-pseudoephedrine (CLARITIN-D 12-HOUR) 5-120 MG per tablet Take 1 tablet by mouth 2 (two) times daily.    Historical Provider, MD  Multiple Vitamins-Minerals (MULTIVITAMIN WITH MINERALS) tablet Take 1 tablet by mouth daily.    Historical Provider, MD   BP 159/93 mmHg  Pulse 95  Temp(Src) 97.9 F (36.6 C) (Oral)  Resp 18  SpO2 100% Physical Exam  Constitutional: He is oriented to person, place, and time. Vital signs are normal. He appears well-developed and well-nourished.  Non-toxic appearance. No distress.  Afebrile, nontoxic, NAD  HENT:  Head: Normocephalic and atraumatic.  Mouth/Throat: Oropharynx is clear and moist and mucous membranes are normal.  Eyes: Conjunctivae and EOM are normal. Right eye exhibits no discharge. Left eye exhibits no discharge.  Neck: Normal range of motion. Neck supple.  Cardiovascular: Normal rate, regular rhythm, normal heart sounds and intact distal pulses.  Exam reveals no gallop and no friction rub.   No murmur heard. Pulmonary/Chest: Effort normal and breath sounds  normal. No respiratory distress. He has no decreased breath sounds. He has no wheezes. He has no rhonchi. He has no rales.  Abdominal: Soft. Normal appearance and bowel sounds are normal. He exhibits no distension. There is tenderness in the suprapubic area and left lower quadrant. There is CVA tenderness. There is no rigidity, no rebound, no guarding, no tenderness at McBurney's point and negative Murphy's sign.    Soft, nondistended, +BS throughout, with mild suprapubic/LLQ TTP extending towards the lateral abdomen on the L, no r/g/r, neg murphy's, neg mcburney's, with mild L sided CVA TTP   Musculoskeletal: Normal range of motion.  MAE x4 Strength and sensation grossly intact Distal pulses intact  Neurological: He is alert and oriented to person, place, and time. He has normal strength. No sensory deficit.  Skin: Skin is warm, dry and intact. No rash noted.  Psychiatric: He has a normal mood and affect.  Nursing note and vitals reviewed.   ED Course  Procedures (including critical care time) Labs Review Labs Reviewed  URINALYSIS, ROUTINE W REFLEX MICROSCOPIC (NOT AT [facility]) - Abnormal; Notable for the following:    APPearance CLOUDY (*)    Hgb urine dipstick LARGE (*)    Ketones, ur 15 (*)  Protein, ur 30 (*)    All other components within normal limits  BASIC METABOLIC PANEL - Abnormal; Notable for the following:    CO2 20 (*)    Glucose, Bld 155 (*)    BUN 21 (*)    Creatinine, Ser 1.33 (*)    GFR calc non Af Amer 54 (*)    All other components within normal limits  URINE MICROSCOPIC-ADD ON - Abnormal; Notable for the following:    Squamous Epithelial / LPF 0-5 (*)    Bacteria, UA FEW (*)    Crystals CA OXALATE CRYSTALS (*)    All other components within normal limits  URINE CULTURE  CBC    Imaging Review Ct Renal Stone Study  05/05/2015  CLINICAL DATA:  70 year old male with left-sided flank pain, acute onset earlier this morning at 10 a.m. EXAM: CT ABDOMEN AND PELVIS  WITHOUT CONTRAST TECHNIQUE: Multidetector CT imaging of the abdomen and pelvis was performed following the standard protocol without IV contrast. COMPARISON:  None. FINDINGS: Lower Chest: The lung bases are clear. Visualized cardiac structures are within normal limits for size. No pericardial effusion. Unremarkable visualized distal thoracic esophagus. Abdomen: Unenhanced CT was performed per clinician order. Lack of IV contrast limits sensitivity and specificity, especially for evaluation of abdominal/pelvic solid viscera. Within these limitations, unremarkable CT appearance of the stomach, duodenum, spleen, adrenal glands and pancreas. Normal hepatic contour and morphology. No discrete hepatic lesion. Gallbladder is unremarkable. No intra or extrahepatic biliary ductal dilatation. Unremarkable appearance of the right kidney. No nephrolithiasis or hydronephrosis. Abnormal appearance of the left kidney with mild-to-moderate hydronephrosis, renal edema and perinephric stranding. The proximal ureter is dilated. There is an obstructing 4 mm stone in the proximal left ureter. No evidence of obstruction or focal bowel wall thickening. Normal appendix in the right lower quadrant. The terminal ileum is unremarkable. No free fluid or suspicious adenopathy. Pelvis: Decompressed bladder. The prostate gland is relatively small. Question prior TURP. Bones/Soft Tissues: No acute fracture or aggressive appearing lytic or blastic osseous lesion. Vascular: Limited evaluation in the absence of intravenous contrast. No significant atherosclerotic plaque or evidence of aneurysm. IMPRESSION: 1. Obstructing 4 mm proximal left ureteral stone with resultant mild -moderate hydronephrosis, renal edema and perinephric stranding. 2. No additional nephroureterolithiasis identified. 3. Otherwise, essentially unremarkable noncontrast CT scan of the abdomen and pelvis. Electronically Signed   By: Jacqulynn Cadet M.D.   On: 05/05/2015 15:47    I have personally reviewed and evaluated these images and lab results as part of my medical decision-making.   EKG Interpretation None      MDM   Final diagnoses:  Left flank pain  Nephrolithiasis  Renal colic  Non-intractable vomiting with nausea, vomiting of unspecified type  AKI (acute kidney injury) ([place])  Hematuria    70 y.o. male here with left flank pain sudden onset this morning, states it feels similar to his prior kidney stones which he needed surgery to remove. On exam, mild suprapubic and left lower quadrant tenderness, mild CVA tenderness on the left. Labs reveal hematuria with too numerous to count RBCs, few bacteria, nitrite and leuk negative, doubt UTI. CBC unremarkable. CMP showing kidney function slightly worse than prior, creatinine 1.33 up from 1.05 in the past. BUN 21 today. Given the worsening kidney function and history of requiring removal surgery for his kidney stones, will obtain CT renal study to evaluate for nephrolithiasis and to further delineate where the stone is and how big it is. We'll give pain  medicine, nausea meds, fluids, and reassess shortly.  3:59 PM Nausea resolved, pain improving. CT showing 65mm obstructing proximal L ureteral stone with mild hydronephrosis and perinephric stranding. 27mm stone should pass on its own. Will let fluids finish since kidney function was slightly diminished today, and PO challenge now, then likely d/c home with percocet, zofran, and flomax. Will also give urine strainer. Pt has a urologist, discussed f/up in 1wk with him. Will reassess after fluids finish. Will also add on urine culture since pt is male.   5:43 PM Pt feeling improved, no ongoing vomiting, fluids finished. Will d/c home with previously discussed plan. F/up with urology in 1wk. I explained the diagnosis and have given explicit precautions to return to the ER including for any other new or worsening symptoms. The patient understands and accepts the medical  plan as it's been dictated and I have answered their questions. Discharge instructions concerning home care and prescriptions have been given. The patient is STABLE and is discharged to home in good condition.  BP 159/93 mmHg  Pulse 95  Temp(Src) 97.9 F (36.6 C) (Oral)  Resp 18  SpO2 100%  Meds ordered this encounter  Medications  . morphine 4 MG/ML injection 4 mg    Sig:   . ondansetron (ZOFRAN) injection 4 mg    Sig:   . sodium chloride 0.9 % bolus 1,000 mL    Sig:   . HYDROcodone-acetaminophen (NORCO/VICODIN) 5-325 MG tablet    Sig: Take 1 tablet by mouth every 6 (six) hours as needed for moderate pain.  . tamsulosin (FLOMAX) 0.4 MG CAPS capsule    Sig: Take 1 capsule (0.4 mg total) by mouth daily after supper. Take until your stone passes    Dispense:  15 capsule    Refill:  0    Order Specific Question:  Supervising Provider    Answer:  MILLER, BRIAN [[id number]]  . ondansetron (ZOFRAN ODT) 4 MG disintegrating tablet    Sig: Take 1 tablet (4 mg total) by mouth every 8 (eight) hours as needed for nausea or vomiting.    Dispense:  15 tablet    Refill:  0    Order Specific Question:  Supervising Provider    Answer:  MILLER, BRIAN [[id number]]  . oxyCODONE-acetaminophen (PERCOCET) 5-325 MG tablet    Sig: Take 1 tablet by mouth every 6 (six) hours as needed for severe pain.    Dispense:  15 tablet    Refill:  0    Order Specific Question:  Supervising Provider    Answer:  Noemi Chapel [[id number]]     Mercedes Camprubi-Soms, PA-C 05/05/15 1743  Noemi Chapel, MD 05/06/15 2345

## 2015-05-07 ENCOUNTER — Encounter (HOSPITAL_COMMUNITY): Payer: Self-pay | Admitting: *Deleted

## 2015-05-07 ENCOUNTER — Emergency Department (HOSPITAL_COMMUNITY)
Admission: EM | Admit: 2015-05-07 | Discharge: 2015-05-07 | Disposition: A | Discharge status: Home / Self Care | Payer: BC Managed Care – PPO | Attending: Emergency Medicine | Admitting: Emergency Medicine

## 2015-05-07 DIAGNOSIS — Z8639 Personal history of other endocrine, nutritional and metabolic disease: Secondary | ICD-10-CM | POA: Insufficient documentation

## 2015-05-07 DIAGNOSIS — N23 Unspecified renal colic: Secondary | ICD-10-CM

## 2015-05-07 DIAGNOSIS — Z79899 Other long term (current) drug therapy: Secondary | ICD-10-CM | POA: Insufficient documentation

## 2015-05-07 DIAGNOSIS — R109 Unspecified abdominal pain: Secondary | ICD-10-CM | POA: Diagnosis present

## 2015-05-07 DIAGNOSIS — Z7982 Long term (current) use of aspirin: Secondary | ICD-10-CM | POA: Insufficient documentation

## 2015-05-07 DIAGNOSIS — Z8719 Personal history of other diseases of the digestive system: Secondary | ICD-10-CM | POA: Diagnosis not present

## 2015-05-07 DIAGNOSIS — Z8546 Personal history of malignant neoplasm of prostate: Secondary | ICD-10-CM | POA: Insufficient documentation

## 2015-05-07 DIAGNOSIS — Z87891 Personal history of nicotine dependence: Secondary | ICD-10-CM | POA: Diagnosis not present

## 2015-05-07 DIAGNOSIS — F429 Obsessive-compulsive disorder, unspecified: Secondary | ICD-10-CM | POA: Diagnosis not present

## 2015-05-07 DIAGNOSIS — Z8679 Personal history of other diseases of the circulatory system: Secondary | ICD-10-CM | POA: Diagnosis not present

## 2015-05-07 LAB — URINE CULTURE: Culture: NO GROWTH

## 2015-05-07 NOTE — ED Provider Notes (Signed)
CSN: [id number]     Arrival date & time 05/07/15  1142 History   First MD Initiated Contact with Patient 05/07/15 1146     Chief Complaint  Patient presents with  . Flank Pain     (Consider location/radiation/quality/duration/timing/severity/associated sxs/prior Treatment) HPI This is a 70 year old man to was seen here on January 19 and diagnosed with a left ureteral kidney stone at 4 mm. He was discharged home on pain medicine, antiemetics, and Flomax. He states that his pain resolved yesterday. He states that his pain began again today. Pain was treated at home with his pain medicine. He took 2 Percocet. The pain is mostly resolved since that time. He returns to the ED stating that he thinks he has a second kidney stone. He did not see any evidence of passing the first kidney stone. He was concerned that the pain had resolved that this is a new kidney stone. He is continued to have some dark urine. He is taking fluids without difficulty. He denies any fever, chills, chest pain, dyspnea, or change in the type of pain that he is experiencing. Past Medical History  Diagnosis Date  . Depression     and OCD  . Diverticulosis of colon   . Unspecified vitamin D deficiency   . Elevated troponin 06/15/2012    hx of type 2 elevated troponin secondary to atrial fib  . H/O atrial flutter 06/15/2012    resolved  . Hyperlipidemia 06/15/2012    mild  . Borderline high cholesterol   . Obsessive compulsive disorder   . Depression   . Cancer ([place]) 11/25/13    prostate CA  . Dysrhythmia     HX ATRIAL FIB AND FLUTTER - PT STATES IT HAS HAPPEN IN [place] - LAST TIME WAS JULY 2015- PT IS NOT ANY ANY MEDICIATON FOR ATRIAL FIB  . History of kidney stones    Past Surgical History  Procedure Laterality Date  . Colonoscopy  2009    Dr. Deatra Ina  . Tonsillectomy    . Wisdom tooth extraction    . Hernia repair      AS A CHILD  . Cosmetic surgery on ears - local in doctor's office    . Esophageal  dilation    . Robot assisted laparoscopic radical prostatectomy N/A 12/24/2013    Procedure: ROBOTIC ASSISTED LAPAROSCOPIC RADICAL PROSTATECTOMY LEVEL 2;  Surgeon: Raynelle Bring, MD;  Location: [facility];  Service: Urology;  Laterality: N/A;  . Lymphadenectomy Bilateral 12/24/2013    Procedure: LYMPHADENECTOMY;  Surgeon: Raynelle Bring, MD;  Location: [facility];  Service: Urology;  Laterality: Bilateral;   No family history on file. Social History  Substance Use Topics  . Smoking status: Former Smoker    Quit date: 04/16/2005  . Smokeless tobacco: Never Used  . Alcohol Use: 8.4 oz/week    14 Glasses of wine per week     Comment: 1-2glasses of wine per night    Review of Systems  All other systems reviewed and are negative.     Allergies  Review of patient's allergies indicates no known allergies.  Home Medications   Prior to Admission medications   Medication Sig Start Date End Date Taking? Authorizing Provider  aspirin 81 MG tablet Take 81 mg by mouth daily.    Historical Provider, MD  Fluvoxamine Maleate (LUVOX CR) 100 MG CP24 Take 1-2 capsules (100-200 mg total) by mouth daily. 08/03/14   Denita Lung, MD  HYDROcodone-acetaminophen (NORCO/VICODIN) 5-325 MG tablet Take 1 tablet  by mouth every 6 (six) hours as needed for moderate pain.    Historical Provider, MD  ibuprofen (ADVIL,MOTRIN) 200 MG tablet Take 400 mg by mouth every 6 (six) hours as needed for headache or moderate pain.    Historical Provider, MD  Multiple Vitamins-Minerals (MULTIVITAMIN WITH MINERALS) tablet Take 1 tablet by mouth daily.    Historical Provider, MD  ondansetron (ZOFRAN ODT) 4 MG disintegrating tablet Take 1 tablet (4 mg total) by mouth every 8 (eight) hours as needed for nausea or vomiting. 05/05/15   Mercedes Camprubi-Soms, PA-C  oxyCODONE-acetaminophen (PERCOCET) 5-325 MG tablet Take 1 tablet by mouth every 6 (six) hours as needed for severe pain. 05/05/15   Mercedes Camprubi-Soms, PA-C  tamsulosin (FLOMAX)  0.4 MG CAPS capsule Take 1 capsule (0.4 mg total) by mouth daily after supper. Take until your stone passes 05/05/15   Mercedes Camprubi-Soms, PA-C   BP 152/92 mmHg  Pulse 81  Temp(Src) 97.8 F (36.6 C) (Oral)  Resp 16  Ht 5\' 10"  (1.778 m)  Wt 74.844 kg  BMI 23.68 kg/m2  SpO2 99% Physical Exam  Constitutional: He is oriented to person, place, and time. He appears well-developed and well-nourished.  HENT:  Head: Normocephalic and atraumatic.  Right Ear: External ear normal.  Left Ear: External ear normal.  Nose: Nose normal.  Mouth/Throat: Oropharynx is clear and moist.  Eyes: Conjunctivae and EOM are normal. Pupils are equal, round, and reactive to light.  Neck: Normal range of motion. Neck supple.  Cardiovascular: Normal rate, regular rhythm, normal heart sounds and intact distal pulses.   Pulmonary/Chest: Effort normal and breath sounds normal.  Abdominal: Soft. Bowel sounds are normal.  Musculoskeletal: Normal range of motion.  Neurological: He is alert and oriented to person, place, and time. He has normal reflexes.  Skin: Skin is warm and dry.  Psychiatric: He has a normal mood and affect. His behavior is normal. Judgment and thought content normal.  Nursing note and vitals reviewed.   ED Course  Procedures (including critical care time) Labs Review Labs Reviewed - No data to display  Imaging Review Ct Renal Stone Study  05/05/2015  CLINICAL DATA:  70 year old male with left-sided flank pain, acute onset earlier this morning at 10 a.m. EXAM: CT ABDOMEN AND PELVIS WITHOUT CONTRAST TECHNIQUE: Multidetector CT imaging of the abdomen and pelvis was performed following the standard protocol without IV contrast. COMPARISON:  None. FINDINGS: Lower Chest: The lung bases are clear. Visualized cardiac structures are within normal limits for size. No pericardial effusion. Unremarkable visualized distal thoracic esophagus. Abdomen: Unenhanced CT was performed per clinician order. Lack  of IV contrast limits sensitivity and specificity, especially for evaluation of abdominal/pelvic solid viscera. Within these limitations, unremarkable CT appearance of the stomach, duodenum, spleen, adrenal glands and pancreas. Normal hepatic contour and morphology. No discrete hepatic lesion. Gallbladder is unremarkable. No intra or extrahepatic biliary ductal dilatation. Unremarkable appearance of the right kidney. No nephrolithiasis or hydronephrosis. Abnormal appearance of the left kidney with mild-to-moderate hydronephrosis, renal edema and perinephric stranding. The proximal ureter is dilated. There is an obstructing 4 mm stone in the proximal left ureter. No evidence of obstruction or focal bowel wall thickening. Normal appendix in the right lower quadrant. The terminal ileum is unremarkable. No free fluid or suspicious adenopathy. Pelvis: Decompressed bladder. The prostate gland is relatively small. Question prior TURP. Bones/Soft Tissues: No acute fracture or aggressive appearing lytic or blastic osseous lesion. Vascular: Limited evaluation in the absence of intravenous contrast. No significant  atherosclerotic plaque or evidence of aneurysm. IMPRESSION: 1. Obstructing 4 mm proximal left ureteral stone with resultant mild -moderate hydronephrosis, renal edema and perinephric stranding. 2. No additional nephroureterolithiasis identified. 3. Otherwise, essentially unremarkable noncontrast CT scan of the abdomen and pelvis. Electronically Signed   By: Jacqulynn Cadet M.D.   On: 05/05/2015 15:47   I have personally reviewed and evaluated these images and lab results as part of my medical decision-making.  MDM   Final diagnoses:  Ureteral colic    70 year old man with ureteral colic. Seen and diagnosed here with CT scan 2 days ago. He is continuing to have ureteral colic. He is hydrating well and has medicines at home. He has urologist and understands the need for follow-up. I checked his urine  culture and there is no growth at 24 hours. I have discussed need for follow-up, typical ongoing symptoms of kidney stone. He voices understanding    Pattricia Boss, MD 05/07/15 [phone]

## 2015-05-07 NOTE — ED Notes (Signed)
rn went to discharge pt and gown laying on bed, pt not in room. Pt left before receiving discharge paperwork.

## 2015-05-07 NOTE — ED Notes (Signed)
Per pt report: pt was seen a few days ago and dx with a kidney stone.  Pt thinks he passed the stone but the pain has returned to the left flank where it was before.  Pt took a percocet about an hour prior to arrival and now rates pain 3/10.  Pt denies any n/v at this time.  Pt a/o x 4 and ambulatory.

## 2015-05-07 NOTE — ED Notes (Signed)
Pt alert and oriented x4. Respirations even and unlabored, bilateral symmetrical rise and fall of chest. Skin warm and dry. In no acute distress. Denies needs.   

## 2015-05-07 NOTE — Discharge Instructions (Signed)
Call your urologist Monday morning for follow up.   Kidney Stones Kidney stones (urolithiasis) are solid masses that form inside your kidneys. The intense pain is caused by the stone moving through the kidney, ureter, bladder, and urethra (urinary tract). When the stone moves, the ureter starts to spasm around the stone. The stone is usually passed in your pee (urine).  HOME CARE  Drink enough fluids to keep your pee clear or pale yellow. This helps to get the stone out.  Take a 24-hour pee (urine) sample as told by your doctor. You may need to take another sample every 6-12 months.  Strain all pee through the provided strainer. Do not pee without peeing through the strainer, not even once. If you pee the stone out, catch it in the strainer. The stone may be as small as a grain of salt. Take this to your doctor. This will help your doctor figure out what you can do to try to prevent more kidney stones.  Only take medicine as told by your doctor.  Make changes to your daily diet as told by your doctor. You may be told to:  Limit how much salt you eat.  Eat 5 or more servings of fruits and vegetables each day.  Limit how much meat, poultry, fish, and eggs you eat.  Keep all follow-up visits as told by your doctor. This is important.  Get follow-up X-rays as told by your doctor. GET HELP IF: You have pain that gets worse even if you have been taking pain medicine. GET HELP RIGHT AWAY IF:   Your pain does not get better with medicine.  You have a fever or shaking chills.  Your pain increases and gets worse over 18 hours.  You have new belly (abdominal) pain.  You feel faint or pass out.  You are unable to pee.   This information is not intended to replace advice given to you by your health care provider. Make sure you discuss any questions you have with your health care provider.   Document Released: 09/19/2007 Document Revised: 12/22/2014 Document Reviewed: 09/03/2012 Elsevier  Interactive Patient Education [employer].

## 2015-05-07 NOTE — ED Notes (Signed)
rn asked md about her conversation with pt. md told pt he was going to be discharged, pt asked if he could leave, md said yes, meaning he was going to be discharged. Pt left before receiving the paperwork due to miscommunication.

## 2015-05-11 DIAGNOSIS — N201 Calculus of ureter: Secondary | ICD-10-CM | POA: Diagnosis not present

## 2015-05-27 DIAGNOSIS — N201 Calculus of ureter: Secondary | ICD-10-CM | POA: Diagnosis not present

## 2015-06-10 DIAGNOSIS — N201 Calculus of ureter: Secondary | ICD-10-CM | POA: Diagnosis not present

## 2015-06-23 ENCOUNTER — Other Ambulatory Visit: Payer: Self-pay | Admitting: Urology

## 2015-06-23 DIAGNOSIS — N201 Calculus of ureter: Secondary | ICD-10-CM | POA: Diagnosis not present

## 2015-06-27 ENCOUNTER — Encounter (HOSPITAL_BASED_OUTPATIENT_CLINIC_OR_DEPARTMENT_OTHER): Payer: Self-pay | Admitting: *Deleted

## 2015-07-04 ENCOUNTER — Encounter (HOSPITAL_BASED_OUTPATIENT_CLINIC_OR_DEPARTMENT_OTHER): Admission: RE | Payer: Self-pay | Source: Ambulatory Visit

## 2015-07-04 ENCOUNTER — Ambulatory Visit (HOSPITAL_BASED_OUTPATIENT_CLINIC_OR_DEPARTMENT_OTHER): Admission: RE | Admit: 2015-07-04 | Payer: BC Managed Care – PPO | Source: Ambulatory Visit | Admitting: Urology

## 2015-07-04 HISTORY — DX: Calculus of ureter: N20.1

## 2015-07-04 HISTORY — DX: Anxiety disorder, unspecified: F41.9

## 2015-07-04 HISTORY — DX: Malignant neoplasm of prostate: C61

## 2015-07-04 HISTORY — DX: Other specified postprocedural states: Z98.890

## 2015-07-04 HISTORY — DX: Personal history of other diseases of the circulatory system: Z86.79

## 2015-07-04 SURGERY — CYSTOSCOPY, WITH CALCULUS REMOVAL USING BASKET
Anesthesia: General | Laterality: Left

## 2015-07-15 ENCOUNTER — Telehealth: Payer: Self-pay | Admitting: Family Medicine

## 2015-07-15 DIAGNOSIS — F429 Obsessive-compulsive disorder, unspecified: Secondary | ICD-10-CM

## 2015-07-15 MED ORDER — FLUVOXAMINE MALEATE ER 100 MG PO CP24: 100.0000 mg | ORAL_CAPSULE | Freq: Every day | ORAL | Status: DC

## 2015-07-15 NOTE — Telephone Encounter (Signed)
Rcvd refill request for Fluvoxamine ER 100 mg #90

## 2015-07-15 NOTE — Telephone Encounter (Signed)
OK to renew but no refill. Have him schedule a med check appointment.

## 2015-08-19 DIAGNOSIS — N393 Stress incontinence (female) (male): Secondary | ICD-10-CM | POA: Diagnosis not present

## 2015-08-19 DIAGNOSIS — N5201 Erectile dysfunction due to arterial insufficiency: Secondary | ICD-10-CM | POA: Diagnosis not present

## 2015-08-19 DIAGNOSIS — C61 Malignant neoplasm of prostate: Secondary | ICD-10-CM | POA: Diagnosis not present

## 2015-09-27 ENCOUNTER — Ambulatory Visit (INDEPENDENT_AMBULATORY_CARE_PROVIDER_SITE_OTHER): Payer: BC Managed Care – PPO | Admitting: Family Medicine

## 2015-09-27 ENCOUNTER — Encounter: Payer: Self-pay | Admitting: Family Medicine

## 2015-09-27 VITALS — BP 120/76 | HR 64 | Ht 70.0 in | Wt 166.0 lb

## 2015-09-27 DIAGNOSIS — E785 Hyperlipidemia, unspecified: Secondary | ICD-10-CM | POA: Diagnosis not present

## 2015-09-27 DIAGNOSIS — Z8679 Personal history of other diseases of the circulatory system: Secondary | ICD-10-CM

## 2015-09-27 DIAGNOSIS — Z23 Encounter for immunization: Secondary | ICD-10-CM | POA: Diagnosis not present

## 2015-09-27 DIAGNOSIS — Z87442 Personal history of urinary calculi: Secondary | ICD-10-CM

## 2015-09-27 DIAGNOSIS — B079 Viral wart, unspecified: Secondary | ICD-10-CM | POA: Diagnosis not present

## 2015-09-27 DIAGNOSIS — Z87891 Personal history of nicotine dependence: Secondary | ICD-10-CM | POA: Diagnosis not present

## 2015-09-27 DIAGNOSIS — K222 Esophageal obstruction: Secondary | ICD-10-CM | POA: Diagnosis not present

## 2015-09-27 DIAGNOSIS — F429 Obsessive-compulsive disorder, unspecified: Secondary | ICD-10-CM

## 2015-09-27 DIAGNOSIS — Z8546 Personal history of malignant neoplasm of prostate: Secondary | ICD-10-CM | POA: Diagnosis not present

## 2015-09-27 DIAGNOSIS — Z0000 Encounter for general adult medical examination without abnormal findings: Secondary | ICD-10-CM | POA: Diagnosis not present

## 2015-09-27 DIAGNOSIS — Z136 Encounter for screening for cardiovascular disorders: Secondary | ICD-10-CM

## 2015-09-27 DIAGNOSIS — J302 Other seasonal allergic rhinitis: Secondary | ICD-10-CM | POA: Diagnosis not present

## 2015-09-27 LAB — LIPID PANEL
Cholesterol: 269 mg/dL — ABNORMAL HIGH (ref 125–200)
HDL: 66 mg/dL (ref >=40)
Total CHOL/HDL Ratio: 4.1 Ratio (ref <=5.0)
Triglycerides: 422 mg/dL — ABNORMAL HIGH (ref <150)

## 2015-09-27 LAB — POCT URINALYSIS DIPSTICK
Bilirubin, UA: NEGATIVE
Blood, UA: NEGATIVE
Glucose, UA: NEGATIVE
Ketones, UA: NEGATIVE
Leukocytes, UA: NEGATIVE
Nitrite, UA: NEGATIVE
Protein, UA: NEGATIVE
Spec Grav, UA: >=1.03
Urobilinogen, UA: NEGATIVE
pH, UA: 5.5

## 2015-09-27 MED ORDER — FLUVOXAMINE MALEATE ER 100 MG PO CP24: 100.0000 mg | ORAL_CAPSULE | Freq: Every day | ORAL | Status: DC

## 2015-09-27 NOTE — Progress Notes (Signed)
Subjective:     Patient ID: [name] [name], male   DOB: 1945/11/19, 70 y.o.   MRN: [id number]  HPI Mr Shamberger is here today for his annual wellness visit.  His only acute complaint is two small skin lesions on his finger that are not causing his any issue.  He had another kidney stone in 2022-04-28 that passed on its own 2 months later.  He was treated by the ED and urology for this issue. His last stone prior to this incident was ~10 years ago.    He has been on a stable regimen of Fluvoxamine for his OCD for the past > 15 years and no longer sees his psychiatrist because has not had any issues with OCD for many years.  He has not had any issues with his seasonal allergies over the past 2-3 years and is not on any allergy medication currently.  His last episode of A Fib was about 2 years ago and resolved on its own.  He has not had any of his usual change in HR, nausea, and dizziness symptoms since his episode 2 years ago and is on aspirin 81 mg.  His has a history of hyperlipidemia but is not currently on any medication for his cholesterol.    He is still having dysphagia symptoms with solid foods when he does not thoroughly chew his foods. He had an esophageal dilatation 3 years ago with initial improvement of symptoms but symptoms came back within a few months.  He did not and does not currently take the PPIs that were suggested at the time of the procedure, but says symptoms have been somewhat stable and not too bothersome.  Mr Roscoe also sees a urologist annually for his history of prostate cancer with his last visit a few weeks ago and an undetectable PSA.  He has a history of robotic surgery.  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  He is a ~20 pack year former smoker and drinks around 14 alcoholic beverages a week.  He has not had an abdominal aortic ultrasound and is in need of a Prevnar-13 today.  Family and social history as well as immunizations and health maintenance reviewed and are  unchanged.  Review of Systems  Constitutional: Negative for fever and unexpected weight change.  HENT: Negative for congestion, postnasal drip, rhinorrhea and sore throat.   Eyes: Negative for redness and visual disturbance.  Respiratory: Negative for cough, shortness of breath and wheezing.   Cardiovascular: Negative for chest pain.  Endocrine: Negative for polyuria.  Genitourinary: Negative for dysuria, hematuria, flank pain and difficulty urinating.  Musculoskeletal: Negative for myalgias and arthralgias.  Allergic/Immunologic: Negative for environmental allergies.  Neurological: Negative for light-headedness and headaches.  Psychiatric/Behavioral: The patient is not nervous/anxious.        Objective:   Physical Exam Alert and in no distress.  Tympanic membranes and canals are normal.  Fundoscopic exam reveals normal vessels without hemorrhage.  Pharyngeal area is normal. Neck is supple without adenopathy or thyromegaly.  Cardiac exam shows a regular sinus rhythm without murmurs or gallops.  Lungs are clear to auscultation bilaterally without wheezes, rales, rhonchi. On abdominal exam, non-tender to palpation in all quadrants, normal bowel sounds, and no HSM.  Flat verrucous lesions on each index finger.       Assessment / Plan:     Routine general medical examination at a health care facility - Plan: POCT Urinalysis Dipstick, Lipid panel  OCD (obsessive compulsive disorder) -  Plan: Fluvoxamine Maleate (LUVOX CR) 100 MG CP24  Allergic rhinitis, seasonal  History of renal stone  History of atrial fibrillation  Hyperlipidemia with target LDL less than 100 - Plan: Lipid panel  Stricture and stenosis of esophagus  History of prostate cancer  Former smoker  Screening for AAA (abdominal aortic aneurysm) - Plan: US ABDOMINAL AORTA SCREENING AAA  Need for prophylactic vaccination against Streptococcus pneumoniae (pneumococcus) - Plan: Pneumococcal conjugate vaccine  13-valent  Wart  OCD Will continue current therapeutic regimens for OCD given efficacy in past.  Hyperlipidemia Will not add medication at this point given lack of cardiac risk factors, but checked lipid levels today.  Stricture of the esophagus He says his symptoms are manageable and will not treat at this time.  If symptoms worsen, can send for re-dilation.    History of prostate cancer Continue annual visits and montioring with urology.  Former smoker / AAA screening Will send for abdominal ultrasound today per screening guidelines. Prevnar-13 given today.  Warts Discussed OTC meds including Compound W and at home freezing in addition to removal of affected tissue post treatment.  He will try to treat at home.  Recommend weekly cycles for the Compound W      History and physical exam conducted by Marylen Ponto (Medical Student) in conjunction with Dr. Redmond School.

## 2015-10-20 ENCOUNTER — Other Ambulatory Visit: Payer: Self-pay | Admitting: Family Medicine

## 2015-10-20 ENCOUNTER — Ambulatory Visit
Admission: RE | Admit: 2015-10-20 | Discharge: 2015-10-20 | Disposition: A | Discharge status: Home / Self Care | Payer: BC Managed Care – PPO | Source: Ambulatory Visit | Attending: Family Medicine | Admitting: Family Medicine

## 2015-10-20 DIAGNOSIS — Z136 Encounter for screening for cardiovascular disorders: Secondary | ICD-10-CM

## 2016-01-04 ENCOUNTER — Other Ambulatory Visit: Payer: Self-pay | Admitting: Family Medicine

## 2016-01-04 DIAGNOSIS — F429 Obsessive-compulsive disorder, unspecified: Secondary | ICD-10-CM

## 2016-01-04 NOTE — Telephone Encounter (Signed)
Is this okay to refill? 

## 2016-02-23 DIAGNOSIS — L300 Nummular dermatitis: Secondary | ICD-10-CM | POA: Diagnosis not present

## 2016-06-28 ENCOUNTER — Ambulatory Visit (INDEPENDENT_AMBULATORY_CARE_PROVIDER_SITE_OTHER): Payer: BC Managed Care – PPO | Admitting: Family Medicine

## 2016-06-28 VITALS — BP 140/100 | HR 60 | Wt 166.0 lb

## 2016-06-28 DIAGNOSIS — M25512 Pain in left shoulder: Secondary | ICD-10-CM | POA: Diagnosis not present

## 2016-06-28 MED ORDER — TRIAMCINOLONE ACETONIDE 40 MG/ML IJ SUSP
40.0000 mg | Freq: Once | INTRAMUSCULAR | Status: AC
  Administered 2016-06-28: 40 mg via INTRAMUSCULAR

## 2016-06-28 MED ORDER — LIDOCAINE HCL 2 % IJ SOLN
3.0000 mL | Freq: Once | INTRAMUSCULAR | Status: AC
  Administered 2016-06-28: 60 mg via INTRADERMAL

## 2016-06-28 NOTE — Progress Notes (Signed)
   Subjective:    Patient ID: [name] [name], male    DOB: 01/16/1946, 71 y.o.   MRN: [id number]  HPI He complains of a three-week history of left shoulder pain. Prior to this he was swimming on a regular basis however stopped and took about 6 months off. He started swimming again 2 months ago. He does use various swimming strokes. 3 weeks ago he noted the onset of left shoulder pain and complains of pain with motion in any direction but no popping or feeling of instability. No other joints are involved. He does have a previous history of shoulder pain and did respond to a steroid injection.   Review of Systems     Objective:   Physical Exam Full motion of the shoulder however he has pain with abduction, external and internal rotation. No tenderness over before meals joint. Negative sulcus test. Neer's and Hawkins test was uncomfortable. No joint laxity noted. Negative drop arm test.       Assessment & Plan:  Left shoulder pain, unspecified chronicity - Plan: triamcinolone acetonide (KENALOG-40) injection 40 mg, lidocaine (XYLOCAINE) 2 % (with pres) injection 60 mg I think this is rotator cuff tendinitis. I discussed various options with him concerning care of this including conservative care with an inside followed by physical therapy versus an injection. He opted to have the injection. Posterior lateral Shoulder was prepped with Betadine. 40 mg of Kenalog and 3 mL of Xylocaine was injected in the subacromial bursa without difficulty. He did obtain relatively quick partial relief of his symptoms. I will also send him to physical therapy for a good rehabilitation program.

## 2016-07-18 IMAGING — CR DG CHEST 2V
2 series · 2 of 2 positions shown · non-contrast
Comparison: None.

CLINICAL DATA: Preop prostatectomy.  Prostate cancer

EXAM:
CHEST  2 VIEW

[w chest pa]
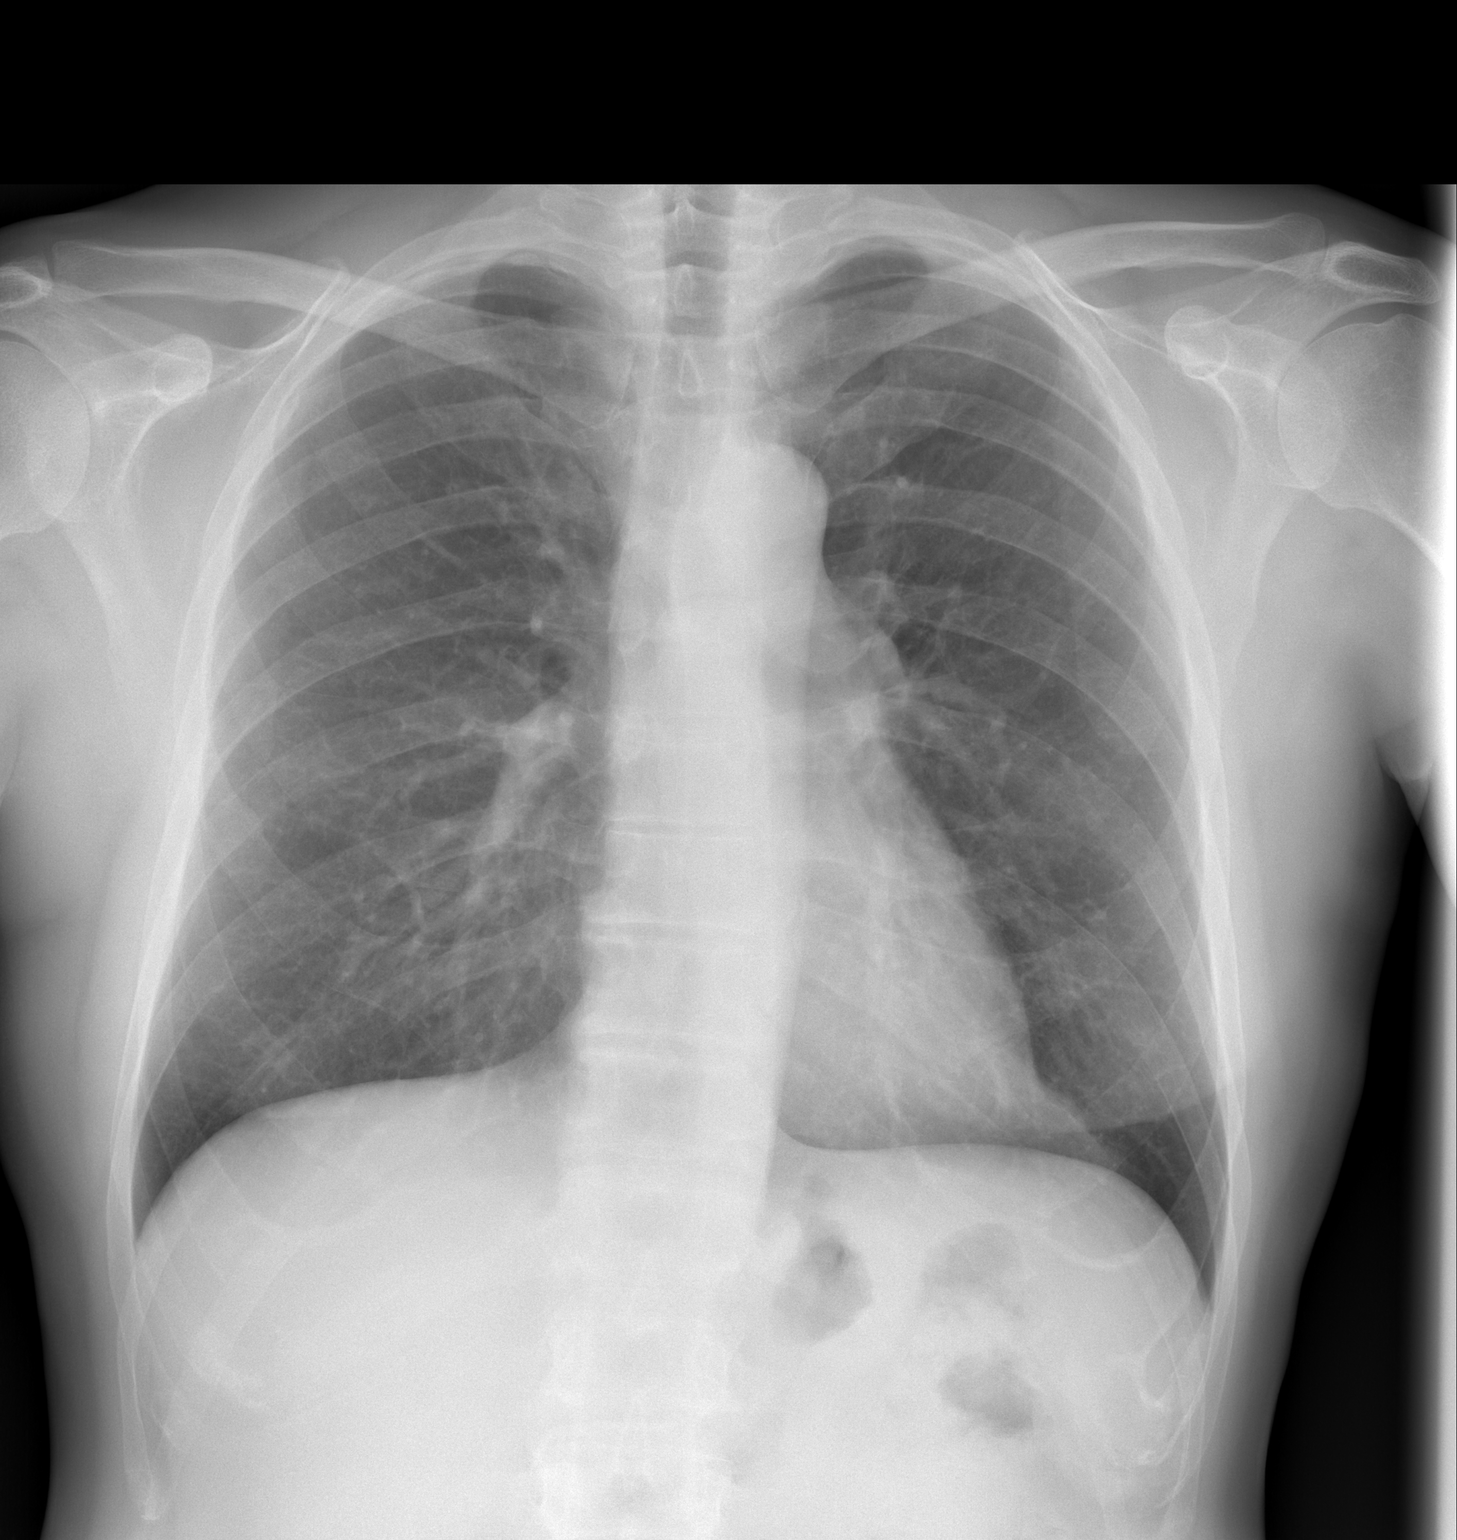

[w chest lat]
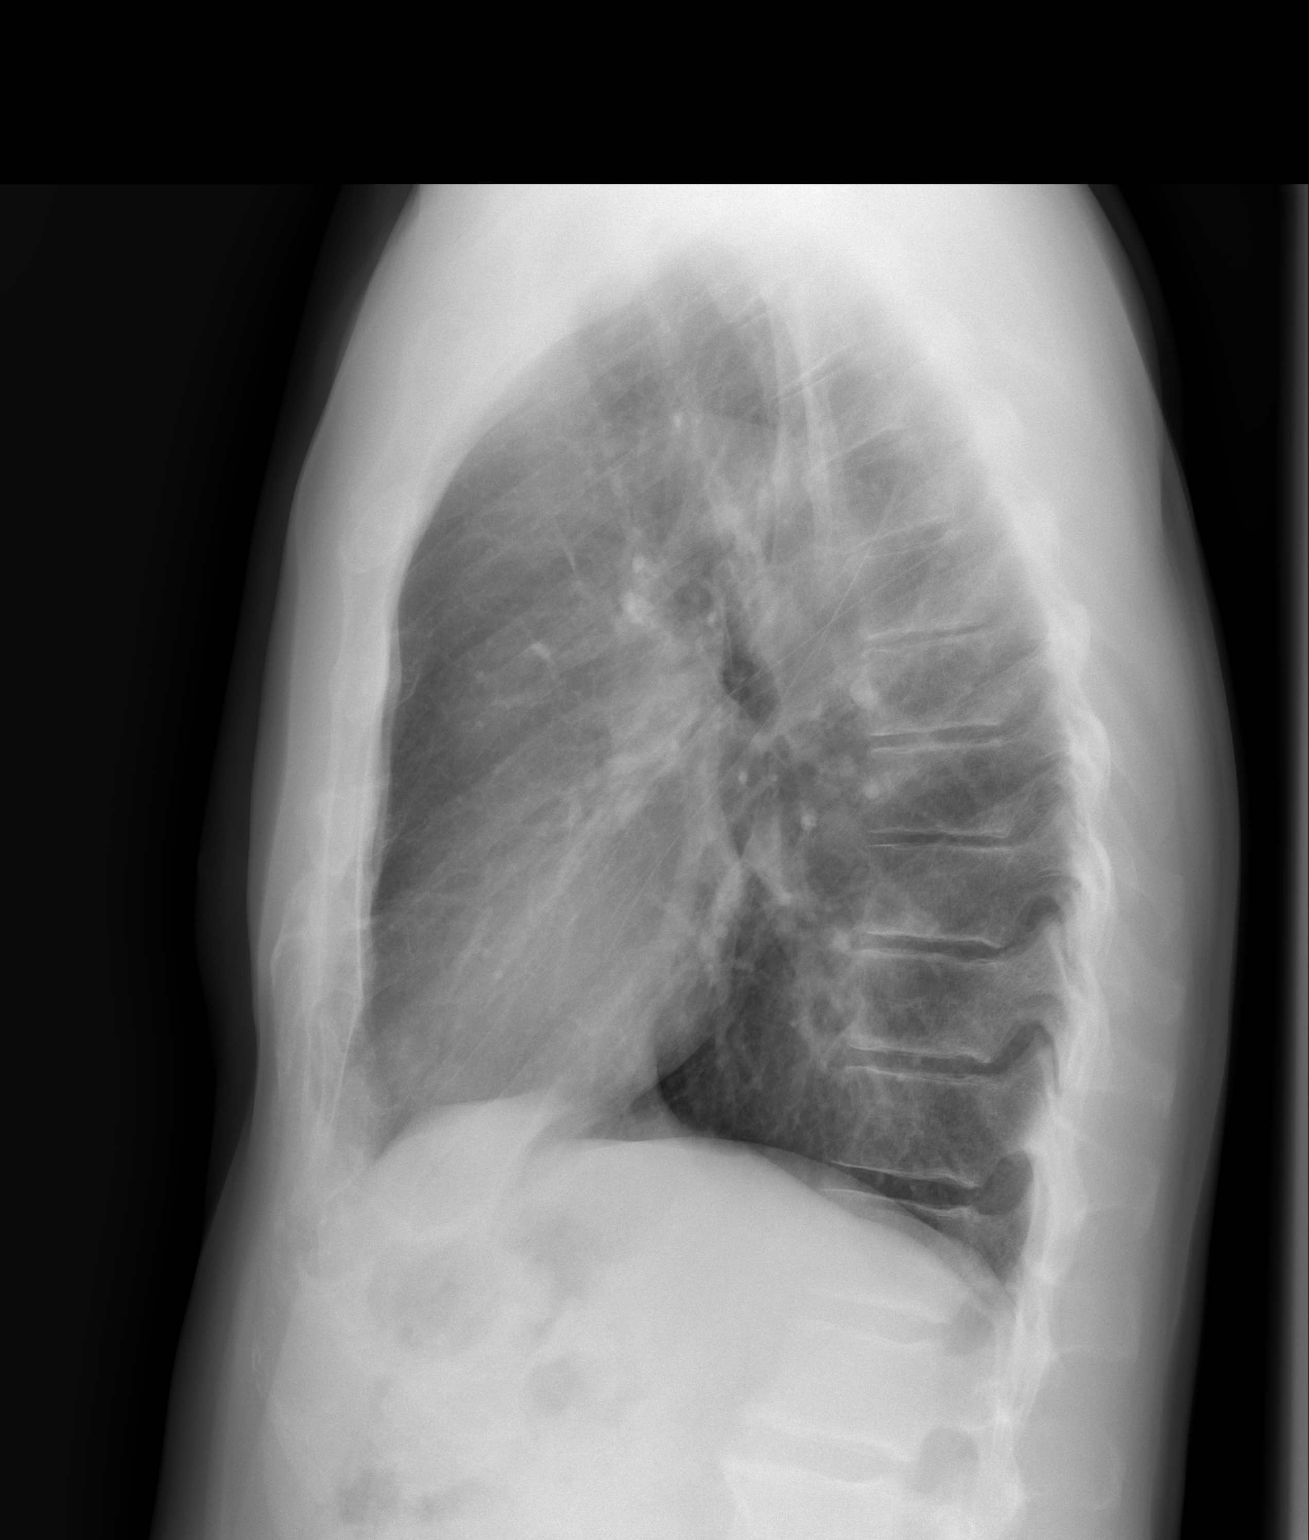

[2 of 2 positions shown; findings below may reference images not displayed]

FINDINGS: The heart size and mediastinal contours are within normal limits.
Both lungs are clear. The visualized skeletal structures are
unremarkable.
IMPRESSION: No active cardiopulmonary disease.

## 2016-08-24 ENCOUNTER — Ambulatory Visit (INDEPENDENT_AMBULATORY_CARE_PROVIDER_SITE_OTHER): Payer: BC Managed Care – PPO | Admitting: Family Medicine

## 2016-08-24 ENCOUNTER — Encounter: Payer: Self-pay | Admitting: Family Medicine

## 2016-08-24 VITALS — BP 140/90 | HR 63 | Temp 98.3°F | Wt 159.0 lb

## 2016-08-24 DIAGNOSIS — R509 Fever, unspecified: Secondary | ICD-10-CM | POA: Diagnosis not present

## 2016-08-24 NOTE — Progress Notes (Signed)
   Subjective:    Patient ID: [name] [name], male    DOB: 22-Nov-1945, 71 y.o.   MRN: [id number]  HPI Chief Complaint  Patient presents with  . sick    fever since tuesday, chills, weak, taken ibuprofen and goes away and comes back later on.   He is here with complaints of a 4 day history of intermittent fever and chills. Temperature has gotten as high as 101 at home. Fever responds to Ibuprofen. He has been feeling tired but no other symptoms.  Denies sweats, unexplained weight change, rash, headache, ear pain, rhinorrhea, nasal congestion, sinus pain, sore throat, chest pain, palpitations, shortness of breath, cough, abdominal pain, back pain, N/V/D or urinary symptoms. Denies arthralgias or myalgias.   Reviewed allergies, medications, past medical, surgical,  and social history.    Review of Systems Pertinent positives and negatives in the history of present illness.     Objective:   Physical Exam  Constitutional: He is oriented to person, place, and time. He appears well-developed and well-nourished. No distress.  HENT:  Right Ear: Tympanic membrane and ear canal normal.  Left Ear: Tympanic membrane and ear canal normal.  Nose: Nose normal. Right sinus exhibits no maxillary sinus tenderness and no frontal sinus tenderness. Left sinus exhibits no maxillary sinus tenderness and no frontal sinus tenderness.  Mouth/Throat: Uvula is midline, oropharynx is clear and moist and mucous membranes are normal.  Eyes: Conjunctivae, EOM and lids are normal. Pupils are equal, round, and reactive to light.  Neck: Normal range of motion. Neck supple.  Cardiovascular: Normal rate, regular rhythm, normal heart sounds and normal pulses.   No LE edema   Pulmonary/Chest: Effort normal and breath sounds normal.  Lymphadenopathy:    He has no cervical adenopathy.       Right: No supraclavicular adenopathy present.       Left: No supraclavicular adenopathy present.  Neurological: He is alert and  oriented to person, place, and time. Gait normal.  Skin: Skin is warm and dry. No rash noted. He is not diaphoretic. No pallor.  Psychiatric: He has a normal mood and affect. His speech is normal and behavior is normal. Thought content normal.   BP 140/90   Pulse 63   Temp 98.3 F (36.8 C) (Oral)   Wt 159 lb (72.1 kg)   SpO2 98%   BMI 22.81 kg/m       Assessment & Plan:  Intermittent fever of unknown origin  Discussed that there is no obvious explanation for his symptoms. His exam is unremarkable. This may be in the early stages of a viral illness.  Will do watchful waiting and have him hydrate and continue taking Ibuprofen. He will call if he notices any new symptoms or if fever continues.

## 2016-08-27 ENCOUNTER — Ambulatory Visit (INDEPENDENT_AMBULATORY_CARE_PROVIDER_SITE_OTHER): Payer: BC Managed Care – PPO | Admitting: Family Medicine

## 2016-08-27 VITALS — BP 160/120 | HR 107 | Temp 98.1°F | Wt 158.0 lb

## 2016-08-27 DIAGNOSIS — R509 Fever, unspecified: Secondary | ICD-10-CM

## 2016-08-27 DIAGNOSIS — R634 Abnormal weight loss: Secondary | ICD-10-CM | POA: Diagnosis not present

## 2016-08-27 LAB — CBC WITH DIFFERENTIAL/PLATELET
Basophils Absolute: 0 cells/uL (ref 0–200)
Basophils Relative: 0 %
Eosinophils Absolute: 73 cells/uL (ref 15–500)
Eosinophils Relative: 1 %
HCT: 47.4 % (ref 38.5–50.0)
Hemoglobin: 16.2 g/dL (ref 13.2–17.1)
Lymphocytes Relative: 14 %
Lymphs Abs: 1022 cells/uL (ref 850–3900)
MCH: 31.8 pg (ref 27.0–33.0)
MCHC: 34.2 g/dL (ref 32.0–36.0)
MCV: 92.9 fL (ref 80.0–100.0)
MPV: 9.4 fL (ref 7.5–12.5)
Monocytes Absolute: 730 cells/uL (ref 200–950)
Monocytes Relative: 10 %
Neutro Abs: 5475 cells/uL (ref 1500–7800)
Neutrophils Relative %: 75 %
Platelets: 182 10*3/uL (ref 140–400)
RBC: 5.1 MIL/uL (ref 4.20–5.80)
RDW: 14 % (ref 11.0–15.0)
WBC: 7.3 10*3/uL (ref 4.0–10.5)

## 2016-08-27 LAB — HEMOCCULT GUIAC POC 1CARD (OFFICE)

## 2016-08-27 NOTE — Progress Notes (Signed)
   Subjective:    Patient ID: [name] [name], male    DOB: 08/20/1945, 71 y.o.   MRN: [id number]  HPI He is here for a recheck. He now complains of intermittent fever over the last week and he is also concerned about weight loss. He's had no chills, sore throat, earache, cough, congestion, shortness of breath, nausea, vomiting, bowel habit changes. He had a colonoscopy in 2009.   Review of Systems     Objective:   Physical Exam Alert and in no distress. Tympanic membranes and canals are normal. Pharyngeal area is normal. Neck is supple without adenopathy or thyromegaly. Cardiac exam shows a regular sinus rhythm without murmurs or gallops. Lungs are clear to auscultation. Rectal exam is guaiac-negative       Assessment & Plan:  Fever, unspecified fever cause - Plan: CBC with Differential/Platelet, Comprehensive metabolic panel, POCT occult blood stool  Weight loss - Plan: CBC with Differential/Platelet, Comprehensive metabolic panel, POCT occult blood stool I reassured him that I did not think he was in any danger specifically in regard to cancer. Will follow-up results of blood work.

## 2016-08-28 ENCOUNTER — Encounter: Payer: Self-pay | Admitting: Family Medicine

## 2016-08-28 LAB — COMPREHENSIVE METABOLIC PANEL
ALT: 18 U/L (ref 9–46)
AST: 22 U/L (ref 10–35)
Albumin: 4.6 g/dL (ref 3.6–5.1)
Alkaline Phosphatase: 57 U/L (ref 40–115)
BUN: 17 mg/dL (ref 7–25)
CO2: 22 mmol/L (ref 20–31)
Calcium: 9.5 mg/dL (ref 8.6–10.3)
Chloride: 105 mmol/L (ref 98–110)
Creat: 1.01 mg/dL (ref 0.70–1.25)
Glucose, Bld: 106 mg/dL — ABNORMAL HIGH (ref 65–99)
Potassium: 3.9 mmol/L (ref 3.5–5.3)
Sodium: 140 mmol/L (ref 135–146)
Total Bilirubin: 0.7 mg/dL (ref 0.2–1.2)
Total Protein: 6.7 g/dL (ref 6.1–8.1)

## 2016-09-12 DIAGNOSIS — C61 Malignant neoplasm of prostate: Secondary | ICD-10-CM | POA: Diagnosis not present

## 2016-09-12 DIAGNOSIS — N393 Stress incontinence (female) (male): Secondary | ICD-10-CM | POA: Diagnosis not present

## 2016-09-12 DIAGNOSIS — N529 Male erectile dysfunction, unspecified: Secondary | ICD-10-CM | POA: Diagnosis not present

## 2016-10-26 DIAGNOSIS — M67912 Unspecified disorder of synovium and tendon, left shoulder: Secondary | ICD-10-CM | POA: Diagnosis not present

## 2016-10-31 ENCOUNTER — Telehealth: Payer: Self-pay | Admitting: Family Medicine

## 2016-10-31 DIAGNOSIS — F429 Obsessive-compulsive disorder, unspecified: Secondary | ICD-10-CM

## 2016-10-31 MED ORDER — FLUVOXAMINE MALEATE ER 100 MG PO CP24: ORAL_CAPSULE | ORAL | 3 refills | Status: DC

## 2016-10-31 NOTE — Telephone Encounter (Signed)
Pt has a CPE on 11/08/16 but he will be running out of Fluvoxamine Maleate soon. Can he get a refill on this med?

## 2016-11-05 DIAGNOSIS — M25512 Pain in left shoulder: Secondary | ICD-10-CM | POA: Diagnosis not present

## 2016-11-08 ENCOUNTER — Encounter: Payer: Self-pay | Admitting: Family Medicine

## 2016-11-08 ENCOUNTER — Ambulatory Visit (INDEPENDENT_AMBULATORY_CARE_PROVIDER_SITE_OTHER): Payer: BC Managed Care – PPO | Admitting: Family Medicine

## 2016-11-08 VITALS — BP 160/100 | HR 77 | Ht 70.0 in | Wt 160.0 lb

## 2016-11-08 DIAGNOSIS — K222 Esophageal obstruction: Secondary | ICD-10-CM

## 2016-11-08 DIAGNOSIS — I10 Essential (primary) hypertension: Secondary | ICD-10-CM

## 2016-11-08 DIAGNOSIS — Z87442 Personal history of urinary calculi: Secondary | ICD-10-CM | POA: Diagnosis not present

## 2016-11-08 DIAGNOSIS — E785 Hyperlipidemia, unspecified: Secondary | ICD-10-CM | POA: Diagnosis not present

## 2016-11-08 DIAGNOSIS — J301 Allergic rhinitis due to pollen: Secondary | ICD-10-CM

## 2016-11-08 DIAGNOSIS — F429 Obsessive-compulsive disorder, unspecified: Secondary | ICD-10-CM | POA: Diagnosis not present

## 2016-11-08 DIAGNOSIS — M25512 Pain in left shoulder: Secondary | ICD-10-CM | POA: Diagnosis not present

## 2016-11-08 DIAGNOSIS — Z8679 Personal history of other diseases of the circulatory system: Secondary | ICD-10-CM

## 2016-11-08 DIAGNOSIS — Z8546 Personal history of malignant neoplasm of prostate: Secondary | ICD-10-CM | POA: Diagnosis not present

## 2016-11-08 LAB — LIPID PANEL
Cholesterol: 238 mg/dL — ABNORMAL HIGH (ref <200)
HDL: 82 mg/dL (ref >40)
LDL Cholesterol: 133 mg/dL — ABNORMAL HIGH (ref <100)
Total CHOL/HDL Ratio: 2.9 Ratio (ref <5.0)
Triglycerides: 115 mg/dL (ref <150)
VLDL: 23 mg/dL (ref <30)

## 2016-11-08 MED ORDER — LISINOPRIL-HYDROCHLOROTHIAZIDE 10-12.5 MG PO TABS: 1.0000 | ORAL_TABLET | Freq: Every day | ORAL | 3 refills | Status: DC

## 2016-11-08 NOTE — Addendum Note (Signed)
Addended by: Denita Lung on: 11/08/2016 04:50 PM   Modules accepted: Orders

## 2016-11-08 NOTE — Progress Notes (Addendum)
Subjective:   HPI  [name] [name] is a 71 y.o. male who presents for Chief Complaint  Patient presents with  . Medicare Wellness    med check plus    Medical care team includes: Denita Lung, MD here for primary care  Dr.[facility] Dr.[facility]   Preventative care: [employer] Last ophthalmology visit:02/2016 Last dental visit: 4 months ago Last colonoscopy:05/23/07 Last prostate exam: N/A Last EKG: 12/15/13 Last labs:09/27/15  Prior vaccinations:  TD or Tdap:05/06/08 Influenza: Up-to-date Pneumococcal: 23 05/07/07 13: 09/27/15 Shingles/Zostavax:05/06/08  Advanced directive: No. Information given. He is followed by his urologist regularly for his underlying prostate cancer and surgery. He does have impotence and incontinence issues but seems to be handling this well. Apparently his last PSA was undetectable. He also has a history of hyperlipidemia but presently is not on any medications. He continues on Luvox and this is helping with his underlying OCD. He has seasonal allergies but is having no difficulty with this. He has a previous history of atrial fib due to alcohol however his drinking is quite minimal. He has not had any renal stones recently. He has not complained of any swallowing issues. He is contemplating retirement.  Reviewed their medical, surgical, family, social, medication, and allergy history and updated chart as appropriate.  Past Medical History:  Diagnosis Date  . Anxiety   . Depression   . Diverticulosis of colon   . History of atrial fibrillation without current medication    episode 2014 and 2015 in [place], [place] (copy of clinical summary scanned in epic from [facility])    . History of esophageal dilatation   . History of kidney stones   . History of kidney stones   . Hyperlipidemia    mild  . Left ureteral stone   . Obsessive compulsive disorder   . Prostate cancer ([place]) UROLOGIST-  DR DALHSTEDT   Gleason 3+4,  PSA  3.93, vol 25.2cc--  s/p  prostatectomy 12-24-2013    Past Surgical History:  Procedure Laterality Date  . COLONOSCOPY  2009   Dr. Deatra Ina  . HERNIA REPAIR  as child  . LYMPHADENECTOMY Bilateral 12/24/2013   Procedure: LYMPHADENECTOMY;  Surgeon: Raynelle Bring, MD;  Location: [facility];  Service: Urology;  Laterality: Bilateral;  . ROBOT ASSISTED LAPAROSCOPIC RADICAL PROSTATECTOMY N/A 12/24/2013   Procedure: ROBOTIC ASSISTED LAPAROSCOPIC RADICAL PROSTATECTOMY LEVEL 2;  Surgeon: Raynelle Bring, MD;  Location: [facility];  Service: Urology;  Laterality: N/A;  . TONSILLECTOMY    . WISDOM TOOTH EXTRACTION      Social History   Social History  . Marital status: Single    Spouse name: N/A  . Number of children: N/A  . Years of education: N/A   Occupational History  . Not on file.   Social History Main Topics  . Smoking status: Former Smoker    Quit date: 04/16/2005  . Smokeless tobacco: Never Used  . Alcohol use 8.4 oz/week    14 Glasses of wine per week     Comment: 1-2glasses of wine per night  . Drug use: No  . Sexual activity: Not on file   Other Topics Concern  . Not on file   Social History Narrative  . No narrative on file    History reviewed. No pertinent family history.   Current Outpatient Prescriptions:  .  aspirin 81 MG tablet, Take 81 mg by mouth daily., Disp: , Rfl:  .  Fluvoxamine Maleate 100 MG CP24, take 1-2 capsules by  mouth daily, Disp: 90 capsule, Rfl: 3 .  ibuprofen (ADVIL,MOTRIN) 200 MG tablet, Take 400 mg by mouth every 6 (six) hours as needed for headache or moderate pain., Disp: , Rfl:  .  Multiple Vitamins-Minerals (MULTIVITAMIN WITH MINERALS) tablet, Take 1 tablet by mouth daily., Disp: , Rfl:  .  lisinopril-hydrochlorothiazide (PRINZIDE,ZESTORETIC) 10-12.5 MG tablet, Take 1 tablet by mouth daily., Disp: 90 tablet, Rfl: 3  No Known Allergies     Review of Systems Negative except as above    Objective:  General appearance: alert, no distress, WD/WN,  Caucasian male Skin: Normal HEENT: normocephalic, conjunctiva/corneas normal, sclerae anicteric, PERRLA, EOMi, nares patent, no discharge or erythema, pharynx normal Oral cavity: MMM, tongue normal, teeth normal Neck: supple, no lymphadenopathy, no thyromegaly, no masses, normal ROM, no bruits Chest: non tender, normal shape and expansion Heart: RRR, normal S1, S2, no murmurs Lungs: CTA bilaterally, no wheezes, rhonchi, or rales Abdomen: +bs, soft, non tender, non distended, no masses, no hepatomegaly, no splenomegaly, no bruits Back: non tender, normal ROM, no scoliosis Musculoskeletal: upper extremities non tender, no obvious deformity, normal ROM throughout, lower extremities non tender, no obvious deformity, normal ROM throughout Extremities: no edema, no cyanosis, no clubbing Pulses: 2+ symmetric, upper and lower extremities, normal cap refill Neurological: alert, oriented x 3, CN2-12 intact, strength normal upper extremities and lower extremities, sensation normal throughout, DTRs 2+ throughout, no cerebellar signs, gait normal Psychiatric: normal affect, behavior normal, pleasant    Assessment and Plan :    Obsessive-compulsive disorder, unspecified type  Left shoulder pain, unspecified chronicity  Seasonal allergic rhinitis due to pollen  History of renal stone  Hyperlipidemia with target LDL less than 100 - Plan: Lipid panel  History of atrial fibrillation  Essential hypertension - Plan: lisinopril-hydrochlorothiazide (PRINZIDE,ZESTORETIC) 10-12.5 MG tablet  History of prostate cancer  Stricture and stenosis of esophagus It is now time to treat his blood pressure. Discussed the medication and possible side effects with him. He'll return here in one month for recheck.   Physical exam - discussed and counseled on healthy lifestyle, diet, exercise, preventative care, vaccinations,He will continue on his present medication regimen. I will check his lipids today. Encouraged  him to continue take good care of himself. Discussed retirement with him in regard to keeping his mind and body busy. *

## 2016-11-09 DIAGNOSIS — M67912 Unspecified disorder of synovium and tendon, left shoulder: Secondary | ICD-10-CM | POA: Diagnosis not present

## 2016-11-12 ENCOUNTER — Telehealth: Payer: Self-pay | Admitting: Internal Medicine

## 2016-11-12 MED ORDER — LOSARTAN POTASSIUM-HCTZ 50-12.5 MG PO TABS: 1.0000 | ORAL_TABLET | Freq: Every day | ORAL | 0 refills | Status: DC

## 2016-11-12 NOTE — Telephone Encounter (Signed)
Apparently the lisinopril causes difficulty with hiccups. I will switch him to losartan

## 2016-11-12 NOTE — Telephone Encounter (Signed)
Left word for word message on VM 

## 2016-11-12 NOTE — Telephone Encounter (Signed)
Pt called and states he started taking a new bp med and it has given him hiccups. He would like to get prescribed something else.

## 2016-11-12 NOTE — Telephone Encounter (Signed)
Let him know that I switch meds. He will need to come in and roughly a month for recheck

## 2016-12-07 DIAGNOSIS — M67912 Unspecified disorder of synovium and tendon, left shoulder: Secondary | ICD-10-CM | POA: Diagnosis not present

## 2016-12-10 ENCOUNTER — Ambulatory Visit (INDEPENDENT_AMBULATORY_CARE_PROVIDER_SITE_OTHER): Payer: BC Managed Care – PPO | Admitting: Family Medicine

## 2016-12-10 ENCOUNTER — Encounter: Payer: Self-pay | Admitting: Family Medicine

## 2016-12-10 VITALS — BP 130/82 | HR 68 | Ht 70.0 in | Wt 160.2 lb

## 2016-12-10 DIAGNOSIS — I10 Essential (primary) hypertension: Secondary | ICD-10-CM | POA: Diagnosis not present

## 2016-12-10 DIAGNOSIS — Z23 Encounter for immunization: Secondary | ICD-10-CM

## 2016-12-10 DIAGNOSIS — M79674 Pain in right toe(s): Secondary | ICD-10-CM | POA: Diagnosis not present

## 2016-12-10 MED ORDER — LOSARTAN POTASSIUM-HCTZ 50-12.5 MG PO TABS: 1.0000 | ORAL_TABLET | Freq: Every day | ORAL | 3 refills | Status: DC

## 2016-12-10 NOTE — Progress Notes (Signed)
   Subjective:    Patient ID: [name] [name], male    DOB: March 25, 1946, 71 y.o.   MRN: [id number]  HPI He is here for recheck on his blood pressure. He is now taking Hyzaar and having no difficulty with that. He also has a two-week history of right second toe pain. No history of injury, change in shoes, exercise pattern. No other joints are involved.   Review of Systems     Objective:   Physical Exam Alert and in no distress. Blood pressure is recorded. Exam of the right second toe shows normal motion of the MTP and PIP joint. No pain on palpation of the joint however there is pain over the midportion of the toe on the plantar surface. Normal flexion and extension of the toe.       Assessment & Plan:  .Essential hypertension - Plan: losartan-hydrochlorothiazide (HYZAAR) 50-12.5 MG tablet  Pain of toe of right foot - Plan: Ambulatory referral to Podiatry The pain in his toe is palpable in the midportion of the toe but has normal tendon motion as well as no pain over the joints. Etiology of this is unclear and therefore I will refer.

## 2016-12-10 NOTE — Addendum Note (Signed)
Addended by: Randel Books on: 12/10/2016 10:35 AM   Modules accepted: Orders

## 2016-12-24 ENCOUNTER — Ambulatory Visit (INDEPENDENT_AMBULATORY_CARE_PROVIDER_SITE_OTHER): Payer: BC Managed Care – PPO | Admitting: Podiatry

## 2016-12-24 ENCOUNTER — Encounter: Payer: Self-pay | Admitting: Podiatry

## 2016-12-24 ENCOUNTER — Ambulatory Visit (INDEPENDENT_AMBULATORY_CARE_PROVIDER_SITE_OTHER): Payer: BC Managed Care – PPO

## 2016-12-24 VITALS — BP 165/99 | HR 56

## 2016-12-24 DIAGNOSIS — M7752 Other enthesopathy of left foot: Secondary | ICD-10-CM

## 2016-12-24 DIAGNOSIS — M79672 Pain in left foot: Secondary | ICD-10-CM | POA: Diagnosis not present

## 2016-12-24 NOTE — Progress Notes (Signed)
   Subjective:    Patient ID: [name] [name], male    DOB: Jun 18, 1945, 71 y.o.   MRN: [id number]  HPI No chief complaint on file.      Review of Systems  Musculoskeletal: Positive for arthralgias.  All other systems reviewed and are negative.      Objective:   Physical Exam        Assessment & Plan:

## 2016-12-28 MED ORDER — BETAMETHASONE SOD PHOS & ACET 6 (3-3) MG/ML IJ SUSP: 3.0000 mg | Freq: Once | INTRAMUSCULAR | Status: DC

## 2016-12-28 NOTE — Progress Notes (Signed)
Patient ID: [name] [name], male   DOB: 1945/05/21, 71 y.o.   MRN: [id number]   HPI: 71 year old male presents the office today for mild discomfort behind the second toe down into the bottom of the foot. This was sudden onset of proximal he 6 weeks ago. Patient states that is aggravated by walking on the treadmill. Patient denies trauma. No alleviating factors.  Past Medical History:  Diagnosis Date  . Anxiety   . Depression   . Diverticulosis of colon   . History of atrial fibrillation without current medication    episode 2014 and 2015 in [place], [place] (copy of clinical summary scanned in epic from [facility])    . History of esophageal dilatation   . History of kidney stones   . History of kidney stones   . Hyperlipidemia    mild  . Left ureteral stone   . Obsessive compulsive disorder   . Prostate cancer ([place]) UROLOGIST-  DR DALHSTEDT   Gleason 3+4,  PSA 3.93, vol 25.2cc--  s/p  prostatectomy 12-24-2013     Physical Exam: General: The patient is alert and oriented x3 in no acute distress.  Dermatology: Skin is warm, dry and supple bilateral lower extremities. Negative for open lesions or macerations.  Vascular: Palpable pedal pulses bilaterally. No edema or erythema noted. Capillary refill within normal limits.  Neurological: Epicritic and protective threshold grossly intact bilaterally.   Musculoskeletal Exam: Range of motion within normal limits to all pedal and ankle joints bilateral. Muscle strength 5/5 in all groups bilateral. Pain on palpation range of motion of the second MPJ left foot.  Radiographic Exam:  Normal osseous mineralization. Joint spaces preserved. No fracture/dislocation/boney destruction.    Assessment: -  Second MPJ capsulitis left   Plan of Care:  - Patient was evaluated today. X-rays reviewed. - Injection of 0.5 mL Celestone Soluspan injected in the second MPJ left foot. - Recommend good supportive shoe gear -  Recommend  activities that do not aggravate the injury. The patient may need to reduce his treadmill exercising at the moment. - Return to clinic in 4 weeks   Edrick Kins, DPM [facility]  Dr. Edrick Kins, DPM    [address], [place] [address]                Office [phone]  Fax [phone]

## 2017-02-11 ENCOUNTER — Telehealth: Payer: Self-pay | Admitting: Family Medicine

## 2017-02-11 NOTE — Telephone Encounter (Signed)
Pt called and stated that when he went to pick up his bp meds it was the incorrect medication. It was for lisinopril. Pharmacy was called and this was a error. Apparently lisinopril was a old med on auto refill. Should have been D/C at pharmacy and was not. Pharmacy to call pt and have corrected.

## 2017-03-19 DIAGNOSIS — H40053 Ocular hypertension, bilateral: Secondary | ICD-10-CM | POA: Diagnosis not present

## 2017-03-19 DIAGNOSIS — H40013 Open angle with borderline findings, low risk, bilateral: Secondary | ICD-10-CM | POA: Diagnosis not present

## 2017-03-19 DIAGNOSIS — H33302 Unspecified retinal break, left eye: Secondary | ICD-10-CM | POA: Diagnosis not present

## 2017-03-19 DIAGNOSIS — H524 Presbyopia: Secondary | ICD-10-CM | POA: Diagnosis not present

## 2017-03-28 DIAGNOSIS — H33302 Unspecified retinal break, left eye: Secondary | ICD-10-CM | POA: Diagnosis not present

## 2017-04-27 ENCOUNTER — Other Ambulatory Visit: Payer: Self-pay | Admitting: Family Medicine

## 2017-04-27 DIAGNOSIS — F429 Obsessive-compulsive disorder, unspecified: Secondary | ICD-10-CM

## 2017-04-29 NOTE — Telephone Encounter (Signed)
Is this okay to refill? 

## 2017-06-22 ENCOUNTER — Encounter: Payer: Self-pay | Admitting: Gastroenterology

## 2017-06-24 ENCOUNTER — Encounter: Payer: Self-pay | Admitting: Gastroenterology

## 2017-08-05 ENCOUNTER — Other Ambulatory Visit: Payer: Self-pay

## 2017-08-05 ENCOUNTER — Ambulatory Visit (AMBULATORY_SURGERY_CENTER): Payer: Self-pay | Admitting: *Deleted

## 2017-08-05 ENCOUNTER — Encounter: Payer: Self-pay | Admitting: Family Medicine

## 2017-08-05 VITALS — Ht 70.0 in | Wt 160.0 lb

## 2017-08-05 DIAGNOSIS — Z1211 Encounter for screening for malignant neoplasm of colon: Secondary | ICD-10-CM

## 2017-08-05 MED ORDER — NA SULFATE-K SULFATE-MG SULF 17.5-3.13-1.6 GM/177ML PO SOLN: ORAL | 0 refills | Status: DC

## 2017-08-05 NOTE — Progress Notes (Signed)
Patient denies any allergies to eggs or soy. Patient denies any problems with anesthesia/sedation. Patient denies any oxygen use at home. Patient denies taking any diet/weight loss medications or blood thinners. EMMI education assisgned to patient on colonoscopy, this was explained and instructions given to patient. 

## 2017-08-15 ENCOUNTER — Encounter: Payer: BC Managed Care – PPO | Admitting: Gastroenterology

## 2017-08-21 ENCOUNTER — Encounter: Payer: Self-pay | Admitting: Gastroenterology

## 2017-08-21 ENCOUNTER — Ambulatory Visit (AMBULATORY_SURGERY_CENTER): Payer: BC Managed Care – PPO | Admitting: Gastroenterology

## 2017-08-21 ENCOUNTER — Other Ambulatory Visit: Payer: Self-pay

## 2017-08-21 VITALS — BP 113/66 | HR 58 | Temp 97.3°F | Resp 16 | Ht 70.0 in | Wt 160.0 lb

## 2017-08-21 DIAGNOSIS — D129 Benign neoplasm of anus and anal canal: Secondary | ICD-10-CM

## 2017-08-21 DIAGNOSIS — Z1211 Encounter for screening for malignant neoplasm of colon: Secondary | ICD-10-CM

## 2017-08-21 DIAGNOSIS — D123 Benign neoplasm of transverse colon: Secondary | ICD-10-CM | POA: Diagnosis not present

## 2017-08-21 DIAGNOSIS — D128 Benign neoplasm of rectum: Secondary | ICD-10-CM

## 2017-08-21 DIAGNOSIS — D122 Benign neoplasm of ascending colon: Secondary | ICD-10-CM

## 2017-08-21 MED ORDER — SODIUM CHLORIDE 0.9 % IV SOLN: 500.0000 mL | Freq: Once | INTRAVENOUS | Status: DC

## 2017-08-21 NOTE — Progress Notes (Signed)
Pt's states no medical or surgical changes since previsit or office visit. 

## 2017-08-21 NOTE — Progress Notes (Signed)
To PACU VSS. Report to [email or web address] 

## 2017-08-21 NOTE — Progress Notes (Signed)
Called to room to assist during endoscopic procedure.  Patient ID and intended procedure confirmed with present staff. Received instructions for my participation in the procedure from the performing physician.  

## 2017-08-21 NOTE — Patient Instructions (Signed)
Thank you for allowing [place] to care for you today!  Await pathology results, approx 2 weeks by mail.  Handouts given for polyps,  diverticulosis, and hemorrhoids    YOU HAD AN ENDOSCOPIC PROCEDURE TODAY AT [facility]:   Refer to the procedure report that was given to you for any specific questions about what was found during the examination.  If the procedure report does not answer your questions, please call your gastroenterologist to clarify.  If you requested that your care partner not be given the details of your procedure findings, then the procedure report has been included in a sealed envelope for you to review at your convenience later.  YOU SHOULD EXPECT: Some feelings of bloating in the abdomen. Passage of more gas than usual.  Walking can help get rid of the air that was put into your GI tract during the procedure and reduce the bloating. If you had a lower endoscopy (such as a colonoscopy or flexible sigmoidoscopy) you may notice spotting of blood in your stool or on the toilet paper. If you underwent a bowel prep for your procedure, you may not have a normal bowel movement for a few days.  Please Note:  You might notice some irritation and congestion in your nose or some drainage.  This is from the oxygen used during your procedure.  There is no need for concern and it should clear up in a day or so.  SYMPTOMS TO REPORT IMMEDIATELY:   Following lower endoscopy (colonoscopy or flexible sigmoidoscopy):  Excessive amounts of blood in the stool  Significant tenderness or worsening of abdominal pains  Swelling of the abdomen that is new, acute  Fever of 100F or higher    For urgent or emergent issues, a gastroenterologist can be reached at any hour by calling [phone].   DIET:  We do recommend a small meal at first, but then you may proceed to your regular diet.  Drink plenty of fluids but you should avoid alcoholic beverages for 24 hours.  ACTIVITY:  You  should plan to take it easy for the rest of today and you should NOT DRIVE or use heavy machinery until tomorrow (because of the sedation medicines used during the test).    FOLLOW UP: Our staff will call the number listed on your records the next business day following your procedure to check on you and address any questions or concerns that you may have regarding the information given to you following your procedure. If we do not reach you, we will leave a message.  However, if you are feeling well and you are not experiencing any problems, there is no need to return our call.  We will assume that you have returned to your regular daily activities without incident.  If any biopsies were taken you will be contacted by phone or by letter within the next 1-3 weeks.  Please call us at [phone] if you have not heard about the biopsies in 3 weeks.    SIGNATURES/CONFIDENTIALITY: You and/or your care partner have signed paperwork which will be entered into your electronic medical record.  These signatures attest to the fact that that the information above on your After Visit Summary has been reviewed and is understood.  Full responsibility of the confidentiality of this discharge information lies with you and/or your care-partner.

## 2017-08-21 NOTE — Op Note (Signed)
[facility] Patient Name: [name] [name] Procedure Date: 08/21/2017 11:19 AM MRN: [id number] Endoscopist: Remo Lipps P. Armbruster MD, MD Age: 72 Referring MD:  Date of Birth: October 19, 1945 Gender: Male Account #: [id number] Procedure:                Colonoscopy Indications:              Screening for colorectal malignant neoplasm Medicines:                Monitored Anesthesia Care Procedure:                Pre-Anesthesia Assessment:                           - Prior to the procedure, a History and Physical                            was performed, and patient medications and                            allergies were reviewed. The patient's tolerance of                            previous anesthesia was also reviewed. The risks                            and benefits of the procedure and the sedation                            options and risks were discussed with the patient.                            All questions were answered, and informed consent                            was obtained. Prior Anticoagulants: The patient has                            taken no previous anticoagulant or antiplatelet                            agents. ASA Grade Assessment: II - A patient with                            mild systemic disease. After reviewing the risks                            and benefits, the patient was deemed in                            satisfactory condition to undergo the procedure.                           After obtaining informed consent, the colonoscope  was passed under direct vision. Throughout the                            procedure, the patient's blood pressure, pulse, and                            oxygen saturations were monitored continuously. The                            Colonoscope was introduced through the anus and                            advanced to the the cecum, identified by                            appendiceal orifice and  ileocecal valve. The                            colonoscopy was performed without difficulty. The                            patient tolerated the procedure well. The quality                            of the bowel preparation was good. The terminal                            ileum, ileocecal valve, appendiceal orifice, and                            rectum were photographed. Scope In: 11:21:56 AM Scope Out: 11:44:30 AM Scope Withdrawal Time: 0 hours 18 minutes 43 seconds  Total Procedure Duration: 0 hours 22 minutes 34 seconds  Findings:                 The perianal and digital rectal examinations were                            normal.                           A 3 mm polyp was found in the ascending colon. The                            polyp was sessile. The polyp was removed with a                            cold snare. Resection and retrieval were complete.                           Two sessile polyps were found in the transverse                            colon. The polyps were 4 mm in size. These polyps  were removed with a cold snare. Resection and                            retrieval were complete.                           A 3 mm polyp was found in the rectum. The polyp was                            sessile. The polyp was removed with a cold snare.                            Resection and retrieval were complete.                           Scattered small-mouthed diverticula were found in                            the entire colon.                           Internal hemorrhoids were found during                            retroflexion. The hemorrhoids were small.                           The exam was otherwise without abnormality. Complications:            No immediate complications. Estimated blood loss:                            Minimal. Estimated Blood Loss:     Estimated blood loss was minimal. Impression:               - One 3 mm polyp in the  ascending colon, removed                            with a cold snare. Resected and retrieved.                           - Two 4 mm polyps in the transverse colon, removed                            with a cold snare. Resected and retrieved.                           - One 3 mm polyp in the rectum, removed with a cold                            snare. Resected and retrieved.                           - Diverticulosis in the entire examined colon.                           -  Internal hemorrhoids.                           - The examination was otherwise normal. Recommendation:           - Patient has a contact number available for                            emergencies. The signs and symptoms of potential                            delayed complications were discussed with the                            patient. Return to normal activities tomorrow.                            Written discharge instructions were provided to the                            patient.                           - Resume previous diet.                           - Continue present medications.                           - Await pathology results.                           - Repeat colonoscopy for surveillance. Remo Lipps P. Armbruster MD, MD 08/21/2017 11:49:29 AM This report has been signed electronically.

## 2017-08-22 ENCOUNTER — Telehealth: Payer: Self-pay | Admitting: *Deleted

## 2017-08-22 ENCOUNTER — Telehealth: Payer: Self-pay

## 2017-08-22 NOTE — Telephone Encounter (Signed)
  Follow up Call-  Call back number 08/21/2017  Post procedure Call Back phone  # [phone]  Permission to leave phone message Yes  Some recent data might be hidden     Patient questions:  Left message to call us if necessary.  Second call.

## 2017-08-22 NOTE — Telephone Encounter (Signed)
Attempted to reach patient for post-procedure f/u call. No answer. Left message that we will make another attempt to reach him again later today and to please not hesitate to call us if he has any questions/concerns regarding his care.

## 2017-08-26 ENCOUNTER — Encounter: Payer: Self-pay | Admitting: Gastroenterology

## 2017-09-06 DIAGNOSIS — C61 Malignant neoplasm of prostate: Secondary | ICD-10-CM | POA: Diagnosis not present

## 2017-09-13 DIAGNOSIS — C61 Malignant neoplasm of prostate: Secondary | ICD-10-CM | POA: Diagnosis not present

## 2017-11-13 ENCOUNTER — Ambulatory Visit (INDEPENDENT_AMBULATORY_CARE_PROVIDER_SITE_OTHER): Payer: BC Managed Care – PPO | Admitting: Family Medicine

## 2017-11-13 ENCOUNTER — Encounter: Payer: Self-pay | Admitting: Family Medicine

## 2017-11-13 VITALS — BP 130/82 | HR 69 | Temp 98.0°F | Ht 70.0 in | Wt 159.6 lb

## 2017-11-13 DIAGNOSIS — E785 Hyperlipidemia, unspecified: Secondary | ICD-10-CM

## 2017-11-13 DIAGNOSIS — Z8679 Personal history of other diseases of the circulatory system: Secondary | ICD-10-CM | POA: Diagnosis not present

## 2017-11-13 DIAGNOSIS — D126 Benign neoplasm of colon, unspecified: Secondary | ICD-10-CM

## 2017-11-13 DIAGNOSIS — I10 Essential (primary) hypertension: Secondary | ICD-10-CM

## 2017-11-13 DIAGNOSIS — J301 Allergic rhinitis due to pollen: Secondary | ICD-10-CM

## 2017-11-13 DIAGNOSIS — K222 Esophageal obstruction: Secondary | ICD-10-CM | POA: Diagnosis not present

## 2017-11-13 DIAGNOSIS — F429 Obsessive-compulsive disorder, unspecified: Secondary | ICD-10-CM | POA: Diagnosis not present

## 2017-11-13 DIAGNOSIS — Z87442 Personal history of urinary calculi: Secondary | ICD-10-CM

## 2017-11-13 DIAGNOSIS — Z860100 Personal history of colon polyps, unspecified: Secondary | ICD-10-CM | POA: Insufficient documentation

## 2017-11-13 DIAGNOSIS — Z8546 Personal history of malignant neoplasm of prostate: Secondary | ICD-10-CM

## 2017-11-13 DIAGNOSIS — Z0000 Encounter for general adult medical examination without abnormal findings: Secondary | ICD-10-CM | POA: Diagnosis not present

## 2017-11-13 HISTORY — DX: Benign neoplasm of colon, unspecified: D12.6

## 2017-11-13 MED ORDER — LOSARTAN POTASSIUM-HCTZ 50-12.5 MG PO TABS: 1.0000 | ORAL_TABLET | Freq: Every day | ORAL | 3 refills | Status: DC

## 2017-11-13 MED ORDER — FLUVOXAMINE MALEATE ER 100 MG PO CP24: ORAL_CAPSULE | ORAL | 3 refills | Status: DC

## 2017-11-13 NOTE — Progress Notes (Signed)
[name] [name] is a 72 y.o. male who presents for annual wellness visit and follow-up on chronic medical conditions.  He has a history of OCD and continues to do quite well on Luvox.  He continues on losartan/HCTZ and is having no trouble with that.  Does have a history of hyperlipidemia as well as kidney stones but no trouble recently.  Allergies are not giving him any trouble.  He is now several years down the road from his prostate cancer as well as a history of atrial fib.  This was a holiday heart issue.  Does have a previous history of esophageal stenosis but has not had any if occult he with swallowing.  He had a recent colonoscopy which did show tubular adenoma and is now on a regular schedule.  He is getting ready to retire.  He has a good plan to how he is planning on spending his retirement.   Immunizations and Health Maintenance Immunization History  Administered Date(s) Administered  . Influenza Split 02/11/2009  . Influenza, High Dose Seasonal PF 12/10/2016  . Influenza,inj,Quad PF,6+ Mos 12/25/2013  . Pneumococcal Conjugate-13 09/27/2015  . Pneumococcal Polysaccharide-23 05/06/2008  . Tdap 05/06/2008  . Zoster 05/06/2008   Health Maintenance Due  Topic Date Due  . PNA vac Low Risk Adult (2 of 2 - PPSV23) 09/26/2016    Last colonoscopy:08/2017 Last PSA: 08/2017 Dentist: 08/2017 Ophtho: 11/2017 Exercise: walking mile   Other doctors caring for patient include: None Advanced Directives:Does Patient Have a Medical Advance Directive?: No Would patient like information on creating a medical advance directive?: Yes (MAU/Ambulatory/Procedural Areas - Information given) No.  Information given.  Depression screen:  See questionnaire below.     Depression screen [facility] 2/9 11/13/2017 11/08/2016 09/27/2015 08/03/2014  Decreased Interest 0 0 0 0  Down, Depressed, Hopeless 0 0 0 0  PHQ - 2 Score 0 0 0 0    Fall Screen: See Questionaire below.   Fall Risk  11/13/2017 11/08/2016 09/27/2015  08/03/2014  Falls in the past year? No No No No    ADL screen:  See questionnaire below.  Functional Status Survey: Is the patient deaf or have difficulty hearing?: No Does the patient have difficulty seeing, even when wearing glasses/contacts?: No Does the patient have difficulty concentrating, remembering, or making decisions?: No Does the patient have difficulty walking or climbing stairs?: No Does the patient have difficulty dressing or bathing?: No Does the patient have difficulty doing errands alone such as visiting a doctor's office or shopping?: No   Review of Systems  Constitutional: -, -unexpected weight change, -anorexia, -fatigue Allergy: -sneezing, -itching, -congestion Dermatology: denies changing moles, rash, lumps ENT: -runny nose, -ear pain, -sore throat,  Cardiology:  -chest pain, -palpitations, -orthopnea, Respiratory: -cough, -shortness of breath, -dyspnea on exertion, -wheezing,  Gastroenterology: -abdominal pain, -nausea, -vomiting, -diarrhea, -constipation, -dysphagia Hematology: -bleeding or bruising problems Musculoskeletal: -arthralgias, -myalgias, -joint swelling, -back pain, - Ophthalmology: -vision changes,  Urology: -dysuria, -difficulty urinating,  -urinary frequency, -urgency, incontinence Neurology: -, -numbness, , -memory loss, -falls, -dizziness    PHYSICAL EXAM:   General Appearance: Alert, cooperative, no distress, appears stated age Head: Normocephalic, without obvious abnormality, atraumatic Eyes: PERRL, conjunctiva/corneas clear, EOM's intact, fundi benign Ears: Normal TM's and external ear canals Nose: Nares normal, mucosa normal, no drainage or sinus   tenderness Throat: Lips, mucosa, and tongue normal; teeth and gums normal Neck: Supple, no lymphadenopathy, thyroid:no enlargement/tenderness/nodules; no carotid bruit or JVD Lungs: Clear to auscultation bilaterally without wheezes, rales or  ronchi; respirations unlabored Heart: Regular  rate and rhythm, S1 and S2 normal, no murmur, rub or gallop Abdomen: Soft, non-tender, nondistended, normoactive bowel sounds, no masses, no hepatosplenomegaly Extremities: No clubbing, cyanosis or edema Pulses: 2+ and symmetric all extremities Skin: Skin color, texture, turgor normal, no rashes or lesions Lymph nodes: Cervical, supraclavicular, and axillary nodes normal Neurologic: CNII-XII intact, normal strength, sensation and gait; reflexes 2+ and symmetric throughout   Psych: Normal mood, affect, hygiene and grooming  ASSESSMENT/PLAN: Routine general medical examination at a health care facility  Essential hypertension - Plan: Comprehensive metabolic panel, CBC with Differential/Platelet, losartan-hydrochlorothiazide (HYZAAR) 50-12.5 MG tablet  Obsessive-compulsive disorder, unspecified type - Plan: Fluvoxamine Maleate 100 MG CP24  Hyperlipidemia with target LDL less than 100 - Plan: Lipid panel  History of renal stone  Seasonal allergic rhinitis due to pollen  History of prostate cancer  History of atrial fibrillation  Stricture and stenosis of esophagus  Adenomatous polyp of colon, unspecified part of colon  I encouraged him to continue to take good care of himself.  He will Continue on his present medication regimen.  Medicare Attestation I have personally reviewed: The patient's medical and social history Their use of alcohol, tobacco or illicit drugs Their current medications and supplements The patient's functional ability including ADLs,fall risks, home safety risks, cognitive, and hearing and visual impairment Diet and physical activities Evidence for depression or mood disorders  The patient's weight, height, and BMI have been recorded in the chart.  I have made referrals, counseling, and provided education to the patient based on review of the above and I have provided the patient with a written personalized care plan for preventive services.     [name] Alexanders,  MD   11/13/2017

## 2017-11-14 LAB — CBC WITH DIFFERENTIAL/PLATELET
Basophils Absolute: 0 10*3/uL (ref 0.0–0.2)
Basos: 1 %
EOS (ABSOLUTE): 0.1 10*3/uL (ref 0.0–0.4)
Eos: 2 %
Hematocrit: 46.7 % (ref 37.5–51.0)
Hemoglobin: 15.9 g/dL (ref 13.0–17.7)
Immature Grans (Abs): 0 10*3/uL (ref 0.0–0.1)
Immature Granulocytes: 0 %
Lymphocytes Absolute: 1.7 10*3/uL (ref 0.7–3.1)
Lymphs: 37 %
MCH: 32 pg (ref 26.6–33.0)
MCHC: 34 g/dL (ref 31.5–35.7)
MCV: 94 fL (ref 79–97)
Monocytes Absolute: 0.5 10*3/uL (ref 0.1–0.9)
Monocytes: 11 %
Neutrophils Absolute: 2.2 10*3/uL (ref 1.4–7.0)
Neutrophils: 49 %
Platelets: 173 10*3/uL (ref 150–450)
RBC: 4.97 x10E6/uL (ref 4.14–5.80)
RDW: 13.8 % (ref 12.3–15.4)
WBC: 4.5 10*3/uL (ref 3.4–10.8)

## 2017-11-14 LAB — COMPREHENSIVE METABOLIC PANEL
ALT: 22 IU/L (ref 0–44)
AST: 26 IU/L (ref 0–40)
Albumin/Globulin Ratio: 2.7 — ABNORMAL HIGH (ref 1.2–2.2)
Albumin: 4.8 g/dL (ref 3.6–4.8)
Alkaline Phosphatase: 71 IU/L (ref 39–117)
BUN/Creatinine Ratio: 15 (ref 10–24)
BUN: 17 mg/dL (ref 8–27)
Bilirubin Total: 0.7 mg/dL (ref 0.0–1.2)
CO2: 25 mmol/L (ref 20–29)
Calcium: 9.7 mg/dL (ref 8.6–10.2)
Chloride: 99 mmol/L (ref 96–106)
Creatinine, Ser: 1.1 mg/dL (ref 0.76–1.27)
GFR calc Af Amer: 79 mL/min/{1.73_m2} (ref >59)
GFR calc non Af Amer: 68 mL/min/{1.73_m2} (ref >59)
Globulin, Total: 1.8 g/dL (ref 1.5–4.5)
Glucose: 96 mg/dL (ref 65–99)
Potassium: 4.3 mmol/L (ref 3.5–5.2)
Sodium: 141 mmol/L (ref 134–144)
Total Protein: 6.6 g/dL (ref 6.0–8.5)

## 2017-11-14 LAB — LIPID PANEL
Chol/HDL Ratio: 2.6 ratio (ref 0.0–5.0)
Cholesterol, Total: 237 mg/dL — ABNORMAL HIGH (ref 100–199)
HDL: 90 mg/dL (ref >39)
LDL Calculated: 133 mg/dL — ABNORMAL HIGH (ref 0–99)
Triglycerides: 70 mg/dL (ref 0–149)
VLDL Cholesterol Cal: 14 mg/dL (ref 5–40)

## 2017-12-06 IMAGING — CT CT RENAL STONE PROTOCOL
2 of 3 series · 16 of 36 positions shown, 18 images · non-contrast
Comparison: None.

CLINICAL DATA: 69-year-old male with left-sided flank pain, acute
onset earlier this morning at 10 a.m..

EXAM:
CT ABDOMEN AND PELVIS WITHOUT CONTRAST
TECHNIQUE: Multidetector CT imaging of the abdomen and pelvis was performed
following the standard protocol without IV contrast.

[Series 3: coronal · coronal · 0.80mm/px · 3 of 78 slices shown]
[im 26/78  soft-tissue]
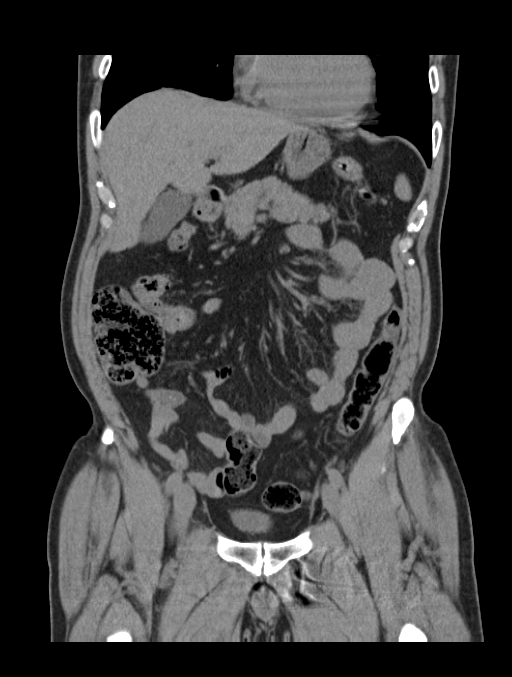
[im 35/78  soft-tissue]
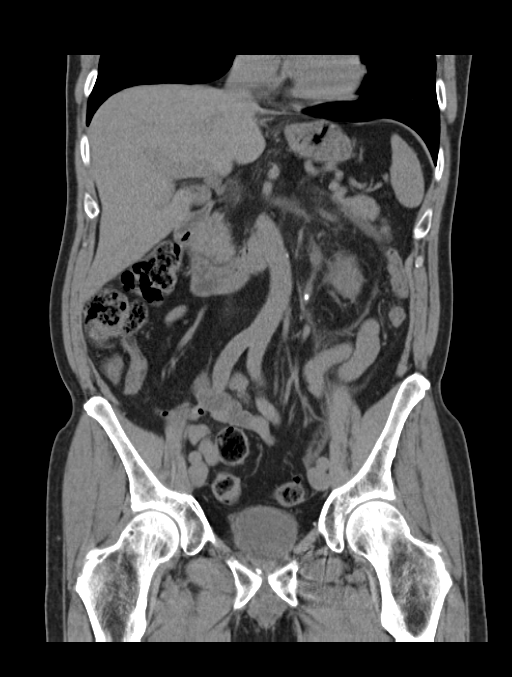
[im 43/78  soft-tissue]
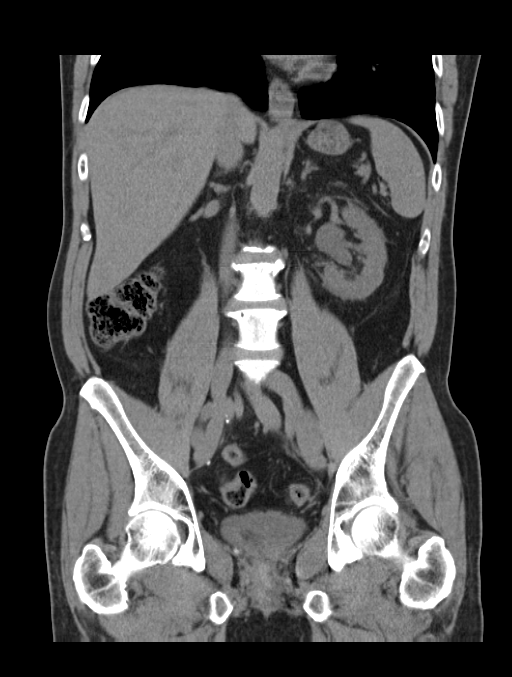

[Series 6: lung · axial · 0.75mm/px · z∈[-320,-226]mm · 13 of 22 slices shown, 15 images]
[im 2/22  soft-tissue]
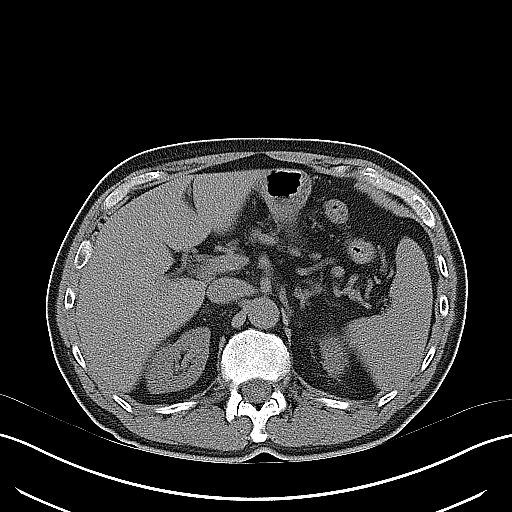
[im 2/22  bone]
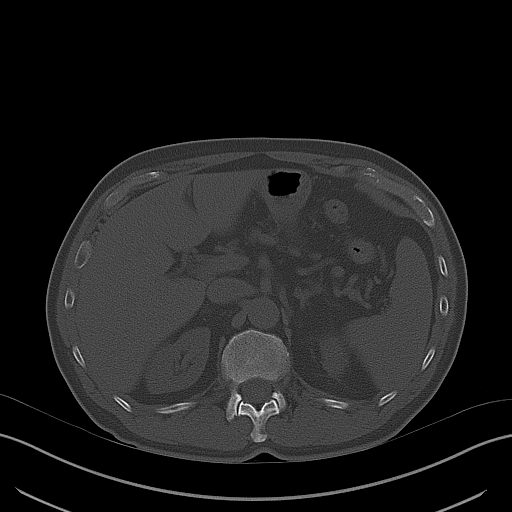
[im 4/22  soft-tissue]
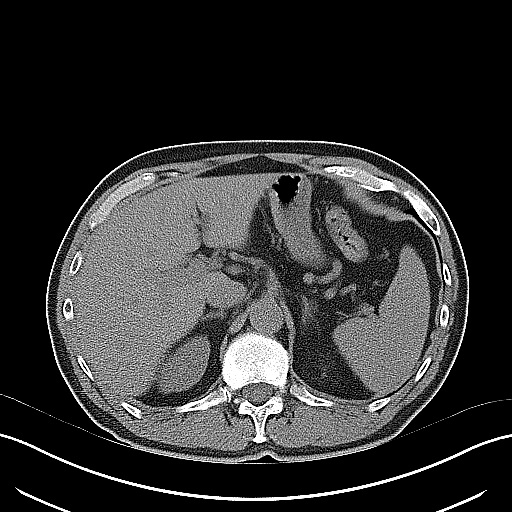
[im 5/22  soft-tissue]
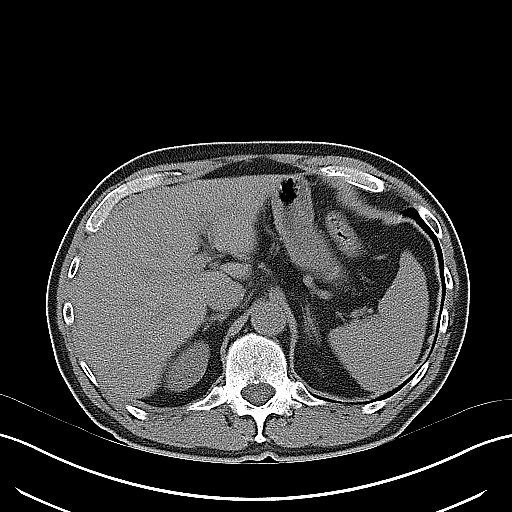
[im 7/22  soft-tissue]
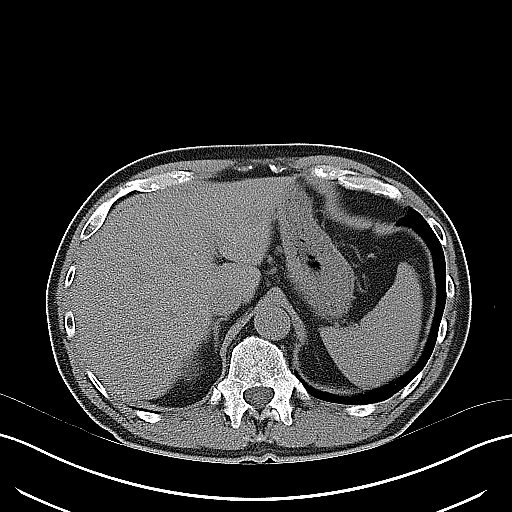
[im 8/22  soft-tissue]
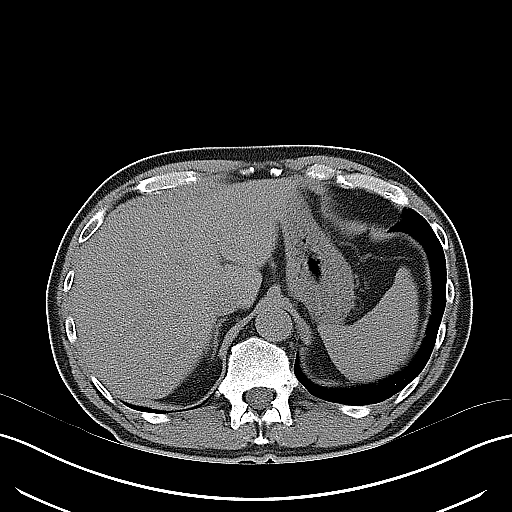
[im 10/22  soft-tissue]
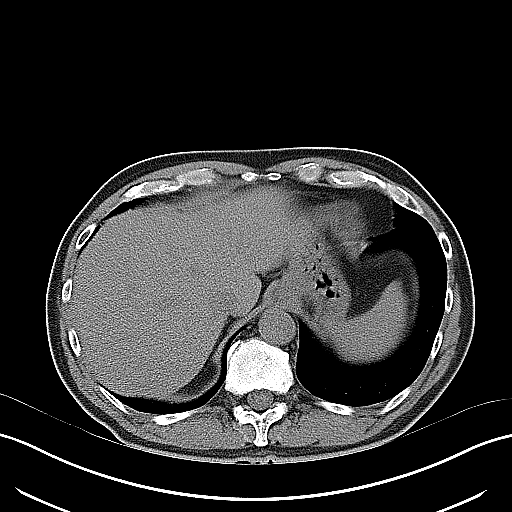
[im 12/22  soft-tissue]
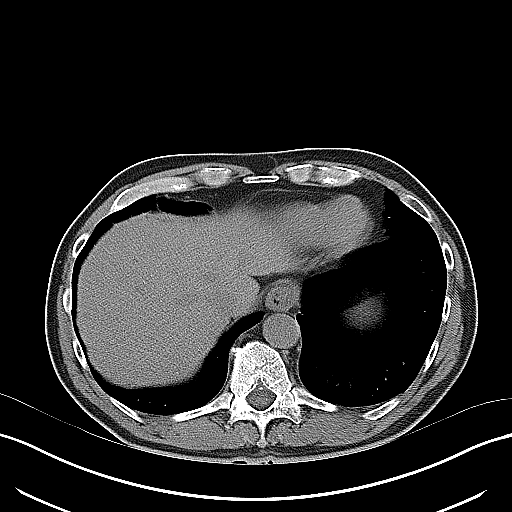
[im 13/22  soft-tissue]
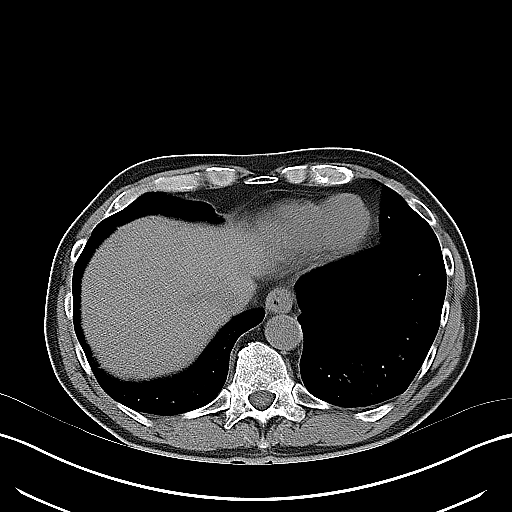
[im 15/22  soft-tissue]
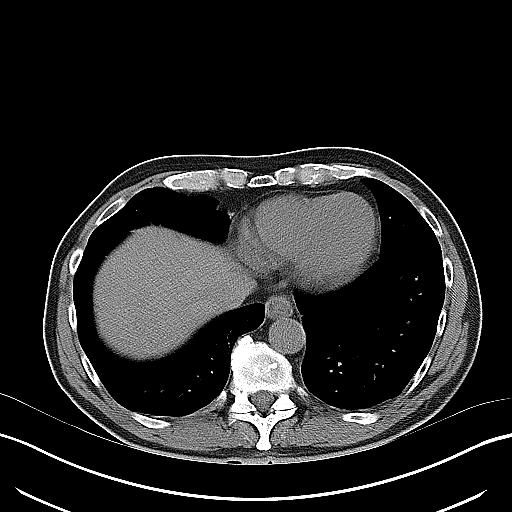
[im 15/22  bone]
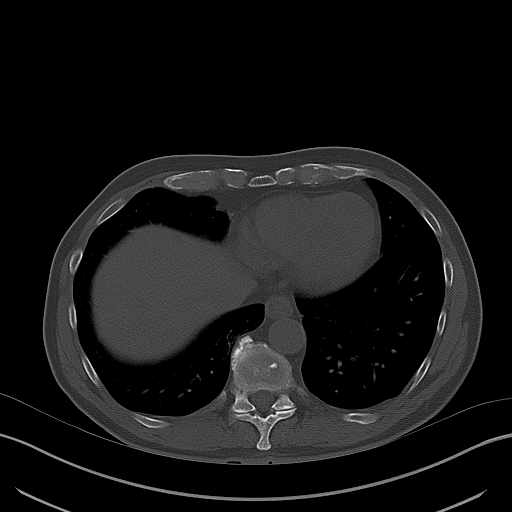
[im 16/22  soft-tissue]
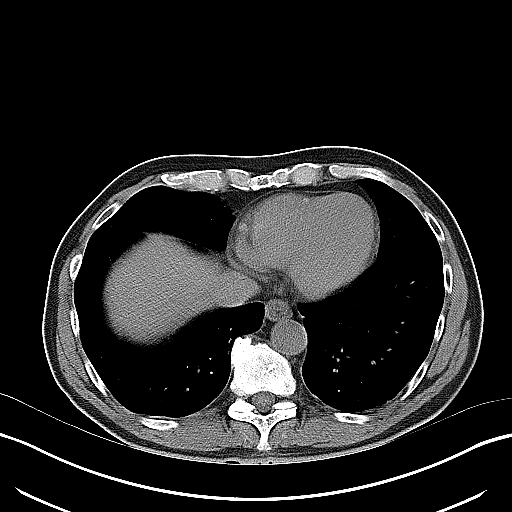
[im 18/22  soft-tissue]
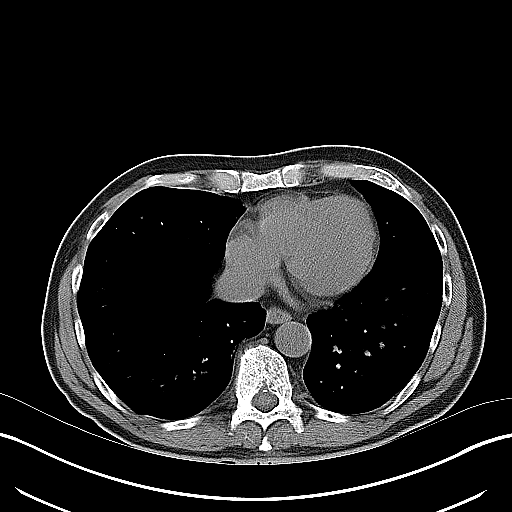
[im 19/22  soft-tissue]
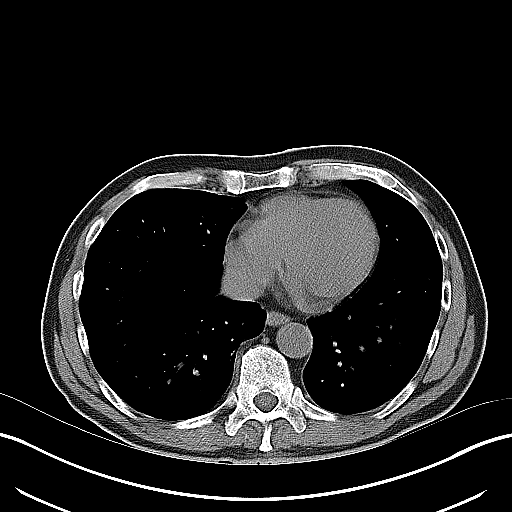
[im 21/22  soft-tissue]
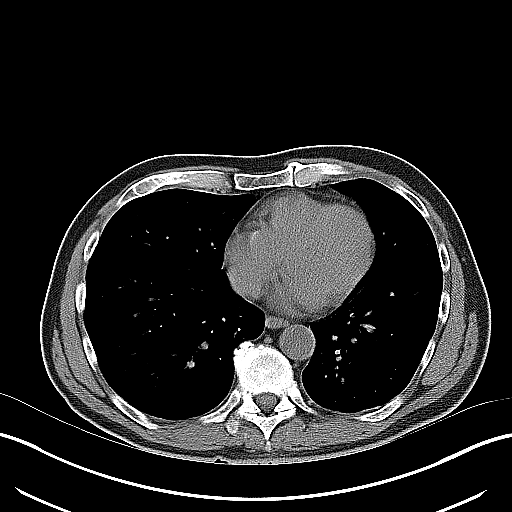

[16 of 36 positions shown; findings below may reference images not displayed]

FINDINGS: Lower Chest: The lung bases are clear. Visualized cardiac structures
are within normal limits for size. No pericardial effusion.
Unremarkable visualized distal thoracic esophagus.

Abdomen: Unenhanced CT was performed per clinician order. Lack of IV
contrast limits sensitivity and specificity, especially for
evaluation of abdominal/pelvic solid viscera. Within these
limitations, unremarkable CT appearance of the stomach, duodenum,
spleen, adrenal glands and pancreas. Normal hepatic contour and
morphology. No discrete hepatic lesion. Gallbladder is unremarkable.
No intra or extrahepatic biliary ductal dilatation.

Unremarkable appearance of the right kidney. No nephrolithiasis or
hydronephrosis. Abnormal appearance of the left kidney with
mild-to-moderate hydronephrosis, renal edema and perinephric
stranding. The proximal ureter is dilated. There is an obstructing 4
mm stone in the proximal left ureter.

No evidence of obstruction or focal bowel wall thickening. Normal
appendix in the right lower quadrant. The terminal ileum is
unremarkable. No free fluid or suspicious adenopathy.

Pelvis: Decompressed bladder. The prostate gland is relatively
small. Question prior TURP.

Bones/Soft Tissues: No acute fracture or aggressive appearing lytic
or blastic osseous lesion.

Vascular: Limited evaluation in the absence of intravenous contrast.
No significant atherosclerotic plaque or evidence of aneurysm.
IMPRESSION: 1. Obstructing 4 mm proximal left ureteral stone with resultant mild
-moderate hydronephrosis, renal edema and perinephric stranding.
2. No additional nephroureterolithiasis identified.
3. Otherwise, essentially unremarkable noncontrast CT scan of the
abdomen and pelvis.

## 2017-12-07 ENCOUNTER — Other Ambulatory Visit: Payer: Self-pay | Admitting: Family Medicine

## 2017-12-07 DIAGNOSIS — F429 Obsessive-compulsive disorder, unspecified: Secondary | ICD-10-CM

## 2017-12-09 NOTE — Telephone Encounter (Signed)
[facility] is requesting to fill pt fluvoxamine. Please advise [facility] 

## 2017-12-31 DIAGNOSIS — H33302 Unspecified retinal break, left eye: Secondary | ICD-10-CM | POA: Diagnosis not present

## 2018-02-05 DIAGNOSIS — M67911 Unspecified disorder of synovium and tendon, right shoulder: Secondary | ICD-10-CM | POA: Diagnosis not present

## 2018-05-20 ENCOUNTER — Telehealth: Payer: Self-pay | Admitting: Family Medicine

## 2018-05-20 NOTE — Telephone Encounter (Signed)
Pharmacist called to say Losartin HCTZ is on back order.  She does have the individual meds for the rx.  Approval was given verbally  for Losartan and HCTZ seperately.

## 2018-05-20 NOTE — Telephone Encounter (Signed)
Go ahead and split it up but just do it for 30 days.

## 2018-05-21 DIAGNOSIS — H2513 Age-related nuclear cataract, bilateral: Secondary | ICD-10-CM | POA: Diagnosis not present

## 2018-05-21 DIAGNOSIS — H40013 Open angle with borderline findings, low risk, bilateral: Secondary | ICD-10-CM | POA: Diagnosis not present

## 2018-05-21 DIAGNOSIS — H52203 Unspecified astigmatism, bilateral: Secondary | ICD-10-CM | POA: Diagnosis not present

## 2018-05-21 DIAGNOSIS — H33302 Unspecified retinal break, left eye: Secondary | ICD-10-CM | POA: Diagnosis not present

## 2018-05-21 NOTE — Telephone Encounter (Signed)
Script was split up. [place]

## 2018-06-03 DIAGNOSIS — H35363 Drusen (degenerative) of macula, bilateral: Secondary | ICD-10-CM | POA: Diagnosis not present

## 2018-06-03 DIAGNOSIS — H33312 Horseshoe tear of retina without detachment, left eye: Secondary | ICD-10-CM | POA: Diagnosis not present

## 2018-06-03 DIAGNOSIS — H3122 Choroidal dystrophy (central areolar) (generalized) (peripapillary): Secondary | ICD-10-CM | POA: Diagnosis not present

## 2018-06-03 DIAGNOSIS — H31092 Other chorioretinal scars, left eye: Secondary | ICD-10-CM | POA: Diagnosis not present

## 2018-06-03 DIAGNOSIS — H25813 Combined forms of age-related cataract, bilateral: Secondary | ICD-10-CM | POA: Diagnosis not present

## 2018-08-16 ENCOUNTER — Other Ambulatory Visit: Payer: Self-pay | Admitting: Family Medicine

## 2018-09-17 DIAGNOSIS — Z8546 Personal history of malignant neoplasm of prostate: Secondary | ICD-10-CM | POA: Diagnosis not present

## 2018-09-17 DIAGNOSIS — N5231 Erectile dysfunction following radical prostatectomy: Secondary | ICD-10-CM | POA: Diagnosis not present

## 2018-11-26 ENCOUNTER — Telehealth: Payer: Self-pay

## 2018-11-26 NOTE — Telephone Encounter (Signed)
Called pt to advise of appt and to ask pt to return call to complete covid screening questions. kh

## 2018-12-05 ENCOUNTER — Other Ambulatory Visit: Payer: Self-pay

## 2018-12-05 ENCOUNTER — Encounter: Payer: Self-pay | Admitting: Family Medicine

## 2018-12-05 ENCOUNTER — Ambulatory Visit (INDEPENDENT_AMBULATORY_CARE_PROVIDER_SITE_OTHER): Payer: Medicare Other | Admitting: Family Medicine

## 2018-12-05 VITALS — BP 130/94 | HR 76 | Temp 98.1°F | Ht 70.25 in | Wt 160.4 lb

## 2018-12-05 DIAGNOSIS — E785 Hyperlipidemia, unspecified: Secondary | ICD-10-CM | POA: Diagnosis not present

## 2018-12-05 DIAGNOSIS — F429 Obsessive-compulsive disorder, unspecified: Secondary | ICD-10-CM

## 2018-12-05 DIAGNOSIS — Z23 Encounter for immunization: Secondary | ICD-10-CM

## 2018-12-05 DIAGNOSIS — D126 Benign neoplasm of colon, unspecified: Secondary | ICD-10-CM

## 2018-12-05 DIAGNOSIS — J301 Allergic rhinitis due to pollen: Secondary | ICD-10-CM

## 2018-12-05 DIAGNOSIS — R0609 Other forms of dyspnea: Secondary | ICD-10-CM

## 2018-12-05 DIAGNOSIS — Z8679 Personal history of other diseases of the circulatory system: Secondary | ICD-10-CM | POA: Diagnosis not present

## 2018-12-05 DIAGNOSIS — Z87442 Personal history of urinary calculi: Secondary | ICD-10-CM

## 2018-12-05 DIAGNOSIS — Z8546 Personal history of malignant neoplasm of prostate: Secondary | ICD-10-CM | POA: Diagnosis not present

## 2018-12-05 DIAGNOSIS — K222 Esophageal obstruction: Secondary | ICD-10-CM

## 2018-12-05 DIAGNOSIS — R0600 Dyspnea, unspecified: Secondary | ICD-10-CM

## 2018-12-05 NOTE — Patient Instructions (Signed)
  [name] , Thank you for taking time to come for your Medicare Wellness Visit. I appreciate your ongoing commitment to your health goals. Please review the following plan we discussed and let me know if I can assist you in the future.   These are the goals we discussed: Continue with her walking program.  Call if the shortness of breath becomes more of an issue.  Continue on your present medications. This is a list of the screening recommended for you and due dates:  Health Maintenance  Topic Date Due  . Pneumonia vaccines (2 of 2 - PPSV23) 09/26/2016  . Tetanus Vaccine  05/06/2018  . Flu Shot  11/15/2018  . Colon Cancer Screening  08/21/2020  .  Hepatitis C: One time screening is recommended by Center for Disease Control  (CDC) for  adults born from [birth year] through 1963.   Completed

## 2018-12-05 NOTE — Progress Notes (Signed)
[name] [name] is a 73 y.o. male who presents for annual wellness visit and follow-up on chronic medical conditions.  He is now retired and seems to be handling this quite nicely.  He continues to do quite nicely on his Luvox and is not interested in stopping that.  He does take an aspirin every day.  He is also taking losartan/HCTZ.  Does have an underlying history of colonic polyps.  He was diagnosed with prostate cancer in 2015.  He will need follow-up PSA.  He has had no esophageal symptoms.  His drinking habits have been diminished and he has had no difficulty with cardiac irregularity.  No history of renal stones recently.  He does complain of difficulty over the last 2 years with shortness of breath with walking up a flight of steps.  It is not gotten worse over this timeframe.  He has had no associated chest pain, diaphoresis, PND.  He does walk daily.   Immunizations and Health Maintenance Immunization History  Administered Date(s) Administered  . Fluad Quad(high Dose 65+) 12/05/2018  . Influenza Split 02/11/2009  . Influenza, High Dose Seasonal PF 12/10/2016  . Influenza,inj,Quad PF,6+ Mos 12/25/2013  . Pneumococcal Conjugate-13 09/27/2015  . Pneumococcal Polysaccharide-23 05/06/2008  . Tdap 05/06/2008  . Zoster 05/06/2008   Health Maintenance Due  Topic Date Due  . PNA vac Low Risk Adult (2 of 2 - PPSV23) 09/26/2016  . TETANUS/TDAP  05/06/2018  . INFLUENZA VACCINE  11/15/2018   / Last colonoscopy:08/21/2017 Last PSA:07/30/2013 Dentist: Dr. Raelyn Ensign  Ophtho: Dr. Celene Squibb  Exercise:walk daily Other doctors caring for patient include: [place]  Advanced Directives: No. Info given Does Patient Have a Medical Advance Directive?: No Would patient like information on creating a medical advance directive?: Yes (MAU/Ambulatory/Procedural Areas - Information given)  Depression screen:  See questionnaire below.     Depression screen [facility] 2/9 12/05/2018 11/13/2017 11/08/2016 09/27/2015 08/03/2014   Decreased Interest 0 0 0 0 0  Down, Depressed, Hopeless 0 0 0 0 0  PHQ - 2 Score 0 0 0 0 0    Fall Screen: See Questionaire below.   Fall Risk  12/05/2018 11/13/2017 11/08/2016 09/27/2015 08/03/2014  Falls in the past year? 0 No No No No    ADL screen:  See questionnaire below.  Functional Status Survey: Is the patient deaf or have difficulty hearing?: No Does the patient have difficulty seeing, even when wearing glasses/contacts?: No Does the patient have difficulty concentrating, remembering, or making decisions?: No Does the patient have difficulty walking or climbing stairs?: Yes Does the patient have difficulty dressing or bathing?: No Does the patient have difficulty doing errands alone such as visiting a doctor's office or shopping?: No   Review of Systems  Constitutional: -, -unexpected weight change, -anorexia, -fatigue Allergy: -sneezing, -itching, -congestion Dermatology: denies changing moles, rash, lumps ENT: -runny nose, -ear pain, -sore throat,  Respiratory: -cough, -shortness of breath, -dyspnea on exertion, -wheezing,  Gastroenterology: -abdominal pain, -nausea, -vomiting, -diarrhea, -constipation, -dysphagia Hematology: -bleeding or bruising problems Musculoskeletal: -arthralgias, -myalgias, -joint swelling, -back pain, - Ophthalmology: -vision changes,  Urology: -dysuria, -difficulty urinating,  -urinary frequency, -urgency, incontinence Neurology: -, -numbness, , -memory loss, -falls, -dizziness    PHYSICAL EXAM:  BP (!) 130/94   Pulse 76   Temp 98.1 F (36.7 C) (Oral)   Ht 5' 10.25" (1.784 m)   Wt 160 lb 6.4 oz (72.8 kg)   SpO2 97%   BMI 22.85 kg/m   General Appearance: Alert, cooperative, no distress,  appears stated age Head: Normocephalic, without obvious abnormality, atraumatic Eyes: PERRL, conjunctiva/corneas clear, EOM's intact, fundi benign Ears: Normal TM's and external ear canals Nose: Nares normal, mucosa normal, no drainage or sinus    tenderness Throat: Lips, mucosa, and tongue normal; teeth and gums normal Neck: Supple, no lymphadenopathy, thyroid:no enlargement/tenderness/nodules; no carotid bruit or JVD Lungs: Clear to auscultation bilaterally without wheezes, rales or ronchi; respirations unlabored Heart: Regular rate and rhythm, S1 and S2 normal, no murmur, rub or gallop Abdomen: Soft, non-tender, nondistended, normoactive bowel sounds, no masses, no hepatosplenomegaly Extremities: No clubbing, cyanosis or edema Pulses: 2+ and symmetric all extremities Skin: Skin color, texture, turgor normal, no rashes or lesions Lymph nodes: Cervical, supraclavicular, and axillary nodes normal Neurologic: CNII-XII intact, normal strength, sensation and gait; reflexes 2+ and symmetric throughout   Psych: Normal mood, affect, hygiene and grooming EKG shows no acute changes ASSESSMENT/PLAN: DOE (dyspnea on exertion) - Plan: EKG 12-Lead, CBC with Differential/Platelet, Comprehensive metabolic panel, Lipid panel I discussed possible referral for further evaluation of DOE however he would like to wait on this. Need for influenza vaccination - Plan: Flu Vaccine QUAD High Dose(Fluad)  Obsessive-compulsive disorder, unspecified type   He would like to continue on this medication  seasonal allergic rhinitis due to pollen Continue on OTC meds.   History of renal stone  Hyperlipidemia with target LDL less than 100 - Plan: Lipid panel  History of atrial fibrillation  Stricture and stenosis of esophagus  He will call if he has further trouble with this. History of prostate cancer - Plan: PSA  Adenomatous polyp of colon, unspecified part of colon - Plan: CBC with Differential/Platelet, Comprehensive metabolic panel    recommended at least 30 minutes of aerobic activity at least 5 days/week;  healthy diet and alcohol recommendations (less than or equal to 2 drinks/day) reviewed; regular seatbelt use; changing batteries in smoke  detectors. Immunization recommendations discussed.  Colonoscopy recommendations reviewed.   Medicare Attestation I have personally reviewed: The patient's medical and social history Their use of alcohol, tobacco or illicit drugs Their current medications and supplements The patient's functional ability including ADLs,fall risks, home safety risks, cognitive, and hearing and visual impairment Diet and physical activities Evidence for depression or mood disorders  The patient's weight, height, and BMI have been recorded in the chart.  I have made referrals, counseling, and provided education to the patient based on review of the above and I have provided the patient with a written personalized care plan for preventive services.     [name], MD   12/05/2018

## 2018-12-06 LAB — CBC WITH DIFFERENTIAL/PLATELET
Basophils Absolute: 0 10*3/uL (ref 0.0–0.2)
Basos: 1 %
EOS (ABSOLUTE): 0.2 10*3/uL (ref 0.0–0.4)
Eos: 4 %
Hematocrit: 46.9 % (ref 37.5–51.0)
Hemoglobin: 16 g/dL (ref 13.0–17.7)
Immature Grans (Abs): 0 10*3/uL (ref 0.0–0.1)
Immature Granulocytes: 0 %
Lymphocytes Absolute: 1.7 10*3/uL (ref 0.7–3.1)
Lymphs: 38 %
MCH: 31.9 pg (ref 26.6–33.0)
MCHC: 34.1 g/dL (ref 31.5–35.7)
MCV: 94 fL (ref 79–97)
Monocytes Absolute: 0.6 10*3/uL (ref 0.1–0.9)
Monocytes: 12 %
Neutrophils Absolute: 2 10*3/uL (ref 1.4–7.0)
Neutrophils: 45 %
Platelets: 168 10*3/uL (ref 150–450)
RBC: 5.01 x10E6/uL (ref 4.14–5.80)
RDW: 13.4 % (ref 11.6–15.4)
WBC: 4.5 10*3/uL (ref 3.4–10.8)

## 2018-12-06 LAB — COMPREHENSIVE METABOLIC PANEL
ALT: 22 IU/L (ref 0–44)
AST: 25 IU/L (ref 0–40)
Albumin/Globulin Ratio: 2.5 — ABNORMAL HIGH (ref 1.2–2.2)
Albumin: 4.8 g/dL (ref 3.8–4.8)
Alkaline Phosphatase: 69 IU/L (ref 39–117)
BUN/Creatinine Ratio: 16 (ref 10–24)
BUN: 18 mg/dL (ref 8–27)
Bilirubin Total: 0.4 mg/dL (ref 0.0–1.2)
CO2: 22 mmol/L (ref 20–29)
Calcium: 9.9 mg/dL (ref 8.6–10.2)
Chloride: 100 mmol/L (ref 96–106)
Creatinine, Ser: 1.1 mg/dL (ref 0.76–1.27)
GFR calc Af Amer: 78 mL/min/{1.73_m2} (ref >59)
GFR calc non Af Amer: 68 mL/min/{1.73_m2} (ref >59)
Globulin, Total: 1.9 g/dL (ref 1.5–4.5)
Glucose: 91 mg/dL (ref 65–99)
Potassium: 4.6 mmol/L (ref 3.5–5.2)
Sodium: 138 mmol/L (ref 134–144)
Total Protein: 6.7 g/dL (ref 6.0–8.5)

## 2018-12-06 LAB — LIPID PANEL
Chol/HDL Ratio: 4.4 ratio (ref 0.0–5.0)
Cholesterol, Total: 262 mg/dL — ABNORMAL HIGH (ref 100–199)
HDL: 60 mg/dL (ref >39)
LDL Calculated: 134 mg/dL — ABNORMAL HIGH (ref 0–99)
Triglycerides: 342 mg/dL — ABNORMAL HIGH (ref 0–149)
VLDL Cholesterol Cal: 68 mg/dL — ABNORMAL HIGH (ref 5–40)

## 2018-12-06 LAB — PSA: Prostate Specific Ag, Serum: <0.1 ng/mL (ref 0.0–4.0)

## 2019-01-16 ENCOUNTER — Other Ambulatory Visit: Payer: Self-pay | Admitting: Family Medicine

## 2019-01-16 DIAGNOSIS — F429 Obsessive-compulsive disorder, unspecified: Secondary | ICD-10-CM

## 2019-01-16 NOTE — Telephone Encounter (Signed)
[facility] is requesting to fill pt fluvoxamine maleate. Please advise [facility]

## 2019-05-24 ENCOUNTER — Ambulatory Visit: Payer: Medicare PPO

## 2019-06-08 ENCOUNTER — Ambulatory Visit: Payer: BC Managed Care – PPO

## 2019-06-13 ENCOUNTER — Ambulatory Visit: Payer: Medicare PPO | Attending: Internal Medicine

## 2019-06-13 DIAGNOSIS — Z23 Encounter for immunization: Secondary | ICD-10-CM | POA: Insufficient documentation

## 2019-06-13 NOTE — Progress Notes (Signed)
   Covid-19 Vaccination Clinic  Name:  [name] [name]    MRN: [id number] DOB: 03-Oct-1945  06/13/2019  [name] was observed post Covid-19 immunization for 15 minutes without incidence. He was provided with Vaccine Information Sheet and instruction to access the V-Safe system.   [name] was instructed to call 911 with any severe reactions post vaccine: Marland Kitchen Difficulty breathing  . Swelling of your face and throat  . A fast heartbeat  . A bad rash all over your body  . Dizziness and weakness    Immunizations Administered    Name Date Dose VIS Date Route   Pfizer COVID-19 Vaccine 06/13/2019  5:32 PM 0.3 mL 03/27/2019 Intramuscular   Manufacturer: [employer]   Lot: [id number]   [place]: [id number]

## 2019-09-21 ENCOUNTER — Other Ambulatory Visit: Payer: Self-pay | Admitting: Family Medicine

## 2019-09-21 DIAGNOSIS — F429 Obsessive-compulsive disorder, unspecified: Secondary | ICD-10-CM

## 2019-09-21 NOTE — Telephone Encounter (Signed)
[facility] is requesting to fill pt fluvoxamine. Please advise [facility] 

## 2019-09-24 ENCOUNTER — Other Ambulatory Visit: Payer: Self-pay | Admitting: Family Medicine

## 2019-09-24 ENCOUNTER — Telehealth: Payer: Self-pay

## 2019-09-24 NOTE — Telephone Encounter (Signed)
Pt was called to make a med check or CPE appt. . Med will be sent in for thirty days [facility]

## 2019-09-24 NOTE — Telephone Encounter (Signed)
Pt. Called back and I got him scheduled on 12/23/19 for his [place].

## 2019-10-21 ENCOUNTER — Other Ambulatory Visit: Payer: Self-pay | Admitting: Family Medicine

## 2019-12-23 ENCOUNTER — Encounter: Payer: Self-pay | Admitting: Family Medicine

## 2019-12-23 ENCOUNTER — Other Ambulatory Visit: Payer: Self-pay

## 2019-12-23 ENCOUNTER — Ambulatory Visit: Payer: Medicare PPO | Admitting: Family Medicine

## 2019-12-23 VITALS — BP 128/78 | HR 67 | Ht 69.5 in | Wt 155.0 lb

## 2019-12-23 DIAGNOSIS — Z23 Encounter for immunization: Secondary | ICD-10-CM | POA: Diagnosis not present

## 2019-12-23 DIAGNOSIS — E785 Hyperlipidemia, unspecified: Secondary | ICD-10-CM | POA: Diagnosis not present

## 2019-12-23 DIAGNOSIS — Z87442 Personal history of urinary calculi: Secondary | ICD-10-CM

## 2019-12-23 DIAGNOSIS — I10 Essential (primary) hypertension: Secondary | ICD-10-CM

## 2019-12-23 DIAGNOSIS — F429 Obsessive-compulsive disorder, unspecified: Secondary | ICD-10-CM | POA: Diagnosis not present

## 2019-12-23 DIAGNOSIS — D126 Benign neoplasm of colon, unspecified: Secondary | ICD-10-CM

## 2019-12-23 DIAGNOSIS — J301 Allergic rhinitis due to pollen: Secondary | ICD-10-CM

## 2019-12-23 DIAGNOSIS — Z0000 Encounter for general adult medical examination without abnormal findings: Secondary | ICD-10-CM | POA: Diagnosis not present

## 2019-12-23 DIAGNOSIS — F419 Anxiety disorder, unspecified: Secondary | ICD-10-CM

## 2019-12-23 DIAGNOSIS — Z8719 Personal history of other diseases of the digestive system: Secondary | ICD-10-CM

## 2019-12-23 DIAGNOSIS — Z8679 Personal history of other diseases of the circulatory system: Secondary | ICD-10-CM

## 2019-12-23 DIAGNOSIS — Z8546 Personal history of malignant neoplasm of prostate: Secondary | ICD-10-CM

## 2019-12-23 MED ORDER — LOSARTAN POTASSIUM-HCTZ 50-12.5 MG PO TABS: 1.0000 | ORAL_TABLET | Freq: Every day | ORAL | 3 refills | Status: DC

## 2019-12-23 MED ORDER — FLUVOXAMINE MALEATE ER 100 MG PO CP24: 1.0000 | ORAL_CAPSULE | Freq: Every day | ORAL | 3 refills | Status: DC

## 2019-12-23 MED ORDER — CLONAZEPAM 0.5 MG PO TABS: 0.5000 mg | ORAL_TABLET | Freq: Two times a day (BID) | ORAL | 0 refills | Status: DC | PRN

## 2019-12-23 NOTE — Progress Notes (Signed)
[name] [name] is a 74 y.o. male who presents for annual wellness visit ,CPE and follow-up on chronic medical conditions.  He has started drinking and has noted on several occasions in his atrial fibrillation has recurred and he therefore stopped drinking again.  He continues on Luvox and does need refill on that.  He is also had anxiety intermittently over the last several years and has been using Klonopin that he obtained from his sister.  He states he uses it every few weeks and has not been able to identify what is causing the anxiety.  He would like his own prescription.  He continues on losartan and HCTZ for his blood pressure.  He also has a previous history of prostate cancer with robotic surgery and would like a follow-up PSA.  He also has a previous history of kidney stones but has not had any recently.  He does have a history of hyperlipidemia.  He also has a previous history of esophageal stricture but has not had any difficulty recently.  He also has a history of colonic polyps and is scheduled for routine follow-up concerning that.  Otherwise family and social history is unremarkable.   Immunizations and Health Maintenance Immunization History  Administered Date(s) Administered  . Fluad Quad(high Dose 65+) 12/05/2018  . Influenza Split 02/11/2009  . Influenza, High Dose Seasonal PF 12/10/2016, 03/22/2018  . Influenza,inj,Quad PF,6+ Mos 12/25/2013  . PFIZER SARS-COV-2 Vaccination 06/13/2019, 07/03/2019  . Pneumococcal Conjugate-13 09/27/2015  . Pneumococcal Polysaccharide-23 05/06/2008  . Tdap 05/06/2008  . Zoster 05/06/2008  . Zoster Recombinat (Shingrix) 03/22/2018, 06/09/2018   Health Maintenance Due  Topic Date Due  . TETANUS/TDAP  05/06/2018  . INFLUENZA VACCINE  11/15/2019    Last colonoscopy: 08/21/17 Last PSA: 12/05/18 Dentist: Q six months  Ophtho: Q six months Exercise: walking 25 min seven days a week   Other doctors caring for patient include: Dr. Diona Fanti urology,  Dr. Duanne Guess GI  Advanced Directives: Does Patient Have a Medical Advance Directive?: Yes Type of Advance Directive: Living will  Depression screen:  See questionnaire below.     Depression screen [facility] 2/9 12/23/2019 12/05/2018 11/13/2017 11/08/2016 09/27/2015  Decreased Interest 0 0 0 0 0  Down, Depressed, Hopeless 0 0 0 0 0  PHQ - 2 Score 0 0 0 0 0    Fall Screen: See Questionaire below.   Fall Risk  12/23/2019 12/05/2018 11/13/2017 11/08/2016 09/27/2015  Falls in the past year? 0 0 No No No    ADL screen:  See questionnaire below.  Functional Status Survey: Is the patient deaf or have difficulty hearing?: No Does the patient have difficulty seeing, even when wearing glasses/contacts?: No Does the patient have difficulty concentrating, remembering, or making decisions?: No Does the patient have difficulty walking or climbing stairs?: No Does the patient have difficulty dressing or bathing?: No Does the patient have difficulty doing errands alone such as visiting a doctor's office or shopping?: No   Review of Systems  Constitutional: -, -unexpected weight change, -anorexia, -fatigue Allergy: -sneezing, -itching, -congestion Dermatology: denies changing moles, rash, lumps ENT: -runny nose, -ear pain, -sore throat,  Cardiology:  -chest pain, -palpitations, -orthopnea, Respiratory: -cough, -shortness of breath, -dyspnea on exertion, -wheezing,  Gastroenterology: -abdominal pain, -nausea, -vomiting, -diarrhea, -constipation, -dysphagia Hematology: -bleeding or bruising problems Musculoskeletal: -arthralgias, -myalgias, -joint swelling, -back pain, - Ophthalmology: -vision changes,  Urology: -dysuria, -difficulty urinating,  -urinary frequency, -urgency, incontinence Neurology: -, -numbness, , -memory loss, -falls, -dizziness    PHYSICAL EXAM:  General Appearance: Alert, cooperative, no distress, appears stated age Head: Normocephalic, without obvious abnormality, atraumatic Eyes:  PERRL, conjunctiva/corneas clear, EOM's intact, fundi benign Ears: Normal TM's and external ear canals Nose: Nares normal, mucosa normal, no drainage or sinus   tenderness Throat: Lips, mucosa, and tongue normal; teeth and gums normal Neck: Supple, no lymphadenopathy, thyroid:no enlargement/tenderness/nodules; no carotid bruit or JVD Lungs: Clear to auscultation bilaterally without wheezes, rales or ronchi; respirations unlabored Heart: Regular rate and rhythm, S1 and S2 normal, no murmur, rub or gallop Abdomen: Soft, non-tender, nondistended, normoactive bowel sounds, no masses, no hepatosplenomegaly   Skin: Skin color, texture, turgor normal, no rashes or lesions Lymph nodes: Cervical, supraclavicular, and axillary nodes normal Neurologic: CNII-XII intact, normal strength, sensation and gait; reflexes 2+ and symmetric throughout   Psych: Normal mood, affect, hygiene and grooming  ASSESSMENT/PLAN: Routine general medical examination at a health care facility  Need for influenza vaccination  Obsessive-compulsive disorder, unspecified type  Seasonal allergic rhinitis due to pollen  Hyperlipidemia with target LDL less than 100  History of renal stone  History of prostate cancer  History of esophageal stricture  History of atrial fibrillation  Adenomatous polyp of colon, unspecified part of colon  Anxiety Hyzaar and Luvox was renewed.  I will also give him a small amount of Klonopin to help.  Discussed the anxiety with him in detail and encouraged him to look into where this might be coming from.  Also discussed the need for pain medications if he does have a kidney stone.  Recommend he call me and I will help work with that.  Discussed the atrial fibrillation with it in relation to alcohol and strongly encouraged him to maintain alcohol consumption under 2 beverages per day.  He is well aware of this and plans to do that.  Presently he is having no difficulty with his esophageal  stricture.  He will follow up on the colonoscopy as previously scheduled.  Continue on Luvox.  Treat allergies as needed     recommended at least 30 minutes of aerobic activity at least 5 days/week;  alcohol recommendations (less than or equal to 2 drinks/day) reviewed;  Immunization recommendations discussed.  Colonoscopy recommendations reviewed.   Medicare Attestation I have personally reviewed: The patient's medical and social history Their use of alcohol, tobacco or illicit drugs Their current medications and supplements The patient's functional ability including ADLs,fall risks, home safety risks, cognitive, and hearing and visual impairment Diet and physical activities Evidence for depression or mood disorders  The patient's weight, height, and BMI have been recorded in the chart.  I have made referrals, counseling, and provided education to the patient based on review of the above and I have provided the patient with a written personalized care plan for preventive services.     [name], MD   12/23/2019

## 2019-12-23 NOTE — Patient Instructions (Signed)
  [name] [name] , Thank you for taking time to come for your Medicare Wellness Visit. I appreciate your ongoing commitment to your health goals. Please review the following plan we discussed and let me know if I can assist you in the future.   These are the goals we discussed:   This is a list of the screening recommended for you and due dates:  Health Maintenance  Topic Date Due  . Tetanus Vaccine  05/06/2018  . Flu Shot  11/15/2019  . Colon Cancer Screening  08/21/2020  . COVID-19 Vaccine  Completed  .  Hepatitis C: One time screening is recommended by Center for Disease Control  (CDC) for  adults born from [birth year] through 1963.   Completed  . Pneumonia vaccines  Discontinued

## 2019-12-24 LAB — CBC WITH DIFFERENTIAL/PLATELET
Basophils Absolute: 0 10*3/uL (ref 0.0–0.2)
Basos: 1 %
EOS (ABSOLUTE): 0.2 10*3/uL (ref 0.0–0.4)
Eos: 4 %
Hematocrit: 48.3 % (ref 37.5–51.0)
Hemoglobin: 17 g/dL (ref 13.0–17.7)
Immature Grans (Abs): 0 10*3/uL (ref 0.0–0.1)
Immature Granulocytes: 0 %
Lymphocytes Absolute: 1.4 10*3/uL (ref 0.7–3.1)
Lymphs: 29 %
MCH: 31.8 pg (ref 26.6–33.0)
MCHC: 35.2 g/dL (ref 31.5–35.7)
MCV: 90 fL (ref 79–97)
Monocytes Absolute: 0.6 10*3/uL (ref 0.1–0.9)
Monocytes: 12 %
Neutrophils Absolute: 2.5 10*3/uL (ref 1.4–7.0)
Neutrophils: 54 %
Platelets: 179 10*3/uL (ref 150–450)
RBC: 5.34 x10E6/uL (ref 4.14–5.80)
RDW: 12.9 % (ref 11.6–15.4)
WBC: 4.6 10*3/uL (ref 3.4–10.8)

## 2019-12-24 LAB — COMPREHENSIVE METABOLIC PANEL
ALT: 19 IU/L (ref 0–44)
AST: 21 IU/L (ref 0–40)
Albumin/Globulin Ratio: 2.5 — ABNORMAL HIGH (ref 1.2–2.2)
Albumin: 4.8 g/dL — ABNORMAL HIGH (ref 3.7–4.7)
Alkaline Phosphatase: 80 IU/L (ref 48–121)
BUN/Creatinine Ratio: 14 (ref 10–24)
BUN: 17 mg/dL (ref 8–27)
Bilirubin Total: 0.7 mg/dL (ref 0.0–1.2)
CO2: 24 mmol/L (ref 20–29)
Calcium: 9.8 mg/dL (ref 8.6–10.2)
Chloride: 106 mmol/L (ref 96–106)
Creatinine, Ser: 1.21 mg/dL (ref 0.76–1.27)
GFR calc Af Amer: 69 mL/min/{1.73_m2} (ref >59)
GFR calc non Af Amer: 59 mL/min/{1.73_m2} — ABNORMAL LOW (ref >59)
Globulin, Total: 1.9 g/dL (ref 1.5–4.5)
Glucose: 97 mg/dL (ref 65–99)
Potassium: 4.7 mmol/L (ref 3.5–5.2)
Sodium: 144 mmol/L (ref 134–144)
Total Protein: 6.7 g/dL (ref 6.0–8.5)

## 2019-12-24 LAB — LIPID PANEL
Chol/HDL Ratio: 3.1 ratio (ref 0.0–5.0)
Cholesterol, Total: 261 mg/dL — ABNORMAL HIGH (ref 100–199)
HDL: 84 mg/dL (ref >39)
LDL Chol Calc (NIH): 159 mg/dL — ABNORMAL HIGH (ref 0–99)
Triglycerides: 105 mg/dL (ref 0–149)
VLDL Cholesterol Cal: 18 mg/dL (ref 5–40)

## 2019-12-24 LAB — PSA: Prostate Specific Ag, Serum: <0.1 ng/mL (ref 0.0–4.0)

## 2020-02-17 ENCOUNTER — Telehealth: Payer: Self-pay | Admitting: Family Medicine

## 2020-02-17 DIAGNOSIS — F429 Obsessive-compulsive disorder, unspecified: Secondary | ICD-10-CM

## 2020-02-17 MED ORDER — FLUVOXAMINE MALEATE ER 100 MG PO CP24: 2.0000 | ORAL_CAPSULE | Freq: Every day | ORAL | 3 refills | Status: DC

## 2020-02-17 NOTE — Telephone Encounter (Signed)
Pt called and states that the Fluvoxamine maleate 100mg  is suppose to be 200 mg is was sent in wrong if that could please be sent in to the [place] [place] [address]

## 2020-02-20 ENCOUNTER — Other Ambulatory Visit: Payer: Self-pay | Admitting: Family Medicine

## 2020-03-03 ENCOUNTER — Other Ambulatory Visit: Payer: Self-pay | Admitting: Family Medicine

## 2020-03-30 DIAGNOSIS — Z20822 Contact with and (suspected) exposure to covid-19: Secondary | ICD-10-CM | POA: Diagnosis not present

## 2020-04-04 DIAGNOSIS — H353131 Nonexudative age-related macular degeneration, bilateral, early dry stage: Secondary | ICD-10-CM | POA: Diagnosis not present

## 2020-04-04 DIAGNOSIS — H31092 Other chorioretinal scars, left eye: Secondary | ICD-10-CM | POA: Diagnosis not present

## 2020-04-04 DIAGNOSIS — H35363 Drusen (degenerative) of macula, bilateral: Secondary | ICD-10-CM | POA: Diagnosis not present

## 2020-04-04 DIAGNOSIS — H33312 Horseshoe tear of retina without detachment, left eye: Secondary | ICD-10-CM | POA: Diagnosis not present

## 2020-04-04 DIAGNOSIS — H25813 Combined forms of age-related cataract, bilateral: Secondary | ICD-10-CM | POA: Diagnosis not present

## 2020-05-24 ENCOUNTER — Emergency Department (HOSPITAL_COMMUNITY): Payer: Medicare PPO

## 2020-05-24 ENCOUNTER — Emergency Department (HOSPITAL_COMMUNITY)
Admission: EM | Admit: 2020-05-24 | Discharge: 2020-05-24 | Disposition: A | Discharge status: Home / Self Care | Payer: Medicare PPO | Attending: Emergency Medicine | Admitting: Emergency Medicine

## 2020-05-24 ENCOUNTER — Telehealth: Payer: Self-pay | Admitting: Internal Medicine

## 2020-05-24 ENCOUNTER — Other Ambulatory Visit: Payer: Self-pay

## 2020-05-24 ENCOUNTER — Encounter (HOSPITAL_COMMUNITY): Payer: Self-pay

## 2020-05-24 ENCOUNTER — Telehealth: Payer: Self-pay | Admitting: Family Medicine

## 2020-05-24 DIAGNOSIS — Z87891 Personal history of nicotine dependence: Secondary | ICD-10-CM | POA: Diagnosis not present

## 2020-05-24 DIAGNOSIS — I10 Essential (primary) hypertension: Secondary | ICD-10-CM | POA: Insufficient documentation

## 2020-05-24 DIAGNOSIS — Z8546 Personal history of malignant neoplasm of prostate: Secondary | ICD-10-CM | POA: Diagnosis not present

## 2020-05-24 DIAGNOSIS — R42 Dizziness and giddiness: Secondary | ICD-10-CM | POA: Insufficient documentation

## 2020-05-24 DIAGNOSIS — R002 Palpitations: Secondary | ICD-10-CM | POA: Diagnosis not present

## 2020-05-24 DIAGNOSIS — R000 Tachycardia, unspecified: Secondary | ICD-10-CM | POA: Insufficient documentation

## 2020-05-24 DIAGNOSIS — Z79899 Other long term (current) drug therapy: Secondary | ICD-10-CM | POA: Diagnosis not present

## 2020-05-24 DIAGNOSIS — Z7982 Long term (current) use of aspirin: Secondary | ICD-10-CM | POA: Diagnosis not present

## 2020-05-24 LAB — BASIC METABOLIC PANEL
Anion gap: 12 (ref 5–15)
BUN: 14 mg/dL (ref 8–23)
CO2: 25 mmol/L (ref 22–32)
Calcium: 9.7 mg/dL (ref 8.9–10.3)
Chloride: 101 mmol/L (ref 98–111)
Creatinine, Ser: 1 mg/dL (ref 0.61–1.24)
GFR, Estimated: >60 mL/min (ref >60)
Glucose, Bld: 120 mg/dL — ABNORMAL HIGH (ref 70–99)
Potassium: 3.6 mmol/L (ref 3.5–5.1)
Sodium: 138 mmol/L (ref 135–145)

## 2020-05-24 LAB — CBC
HCT: 47.4 % (ref 39.0–52.0)
Hemoglobin: 16.4 g/dL (ref 13.0–17.0)
MCH: 31.2 pg (ref 26.0–34.0)
MCHC: 34.6 g/dL (ref 30.0–36.0)
MCV: 90.1 fL (ref 80.0–100.0)
Platelets: 168 10*3/uL (ref 150–400)
RBC: 5.26 MIL/uL (ref 4.22–5.81)
RDW: 12.1 % (ref 11.5–15.5)
WBC: 5.5 10*3/uL (ref 4.0–10.5)
nRBC: 0 % (ref 0.0–0.2)

## 2020-05-24 NOTE — Telephone Encounter (Signed)
Pt called and states he is having rapid heartrate, SOB and dizziness. He has had issues with his heart before. Pt spoke to Dr. Redmond School. His heartrate was 140-150 and due to that and sob and dizziness, Dr. Redmond School advised to go to ER now has he thinks hes in A-fib.

## 2020-05-24 NOTE — Telephone Encounter (Signed)
Pt went to the ER today for Afib. He wanted to see if you had any recommendations for a Cardiologist

## 2020-05-24 NOTE — Telephone Encounter (Signed)
Tell him I will be able to answer after I have a chance to look at the ER record

## 2020-05-24 NOTE — ED Triage Notes (Addendum)
Patient reports rapid heart rate that started 7 hours ago.   Patient reports he has a hx of A.fibb "several" times but this time it seems more rapid than irregular.   Patient reports dizziness and slight nausea.   A/Ox4   [id number]

## 2020-05-24 NOTE — Telephone Encounter (Signed)
Pt was advised [facility] 

## 2020-05-24 NOTE — ED Provider Notes (Signed)
[facility] DEPT Provider Note   CSN: [id number] Arrival date & time: 05/24/20  1200     History Chief Complaint  Patient presents with  . Palpitations  . Dizziness    [name] [name] is a 75 y.o. male.  HPI Patient presents with dizziness and fast heart rate.  States woke up with it this morning.  States that history of atrial fibrillation previously.  States however this feels like the rate is fast like A. fib but does not feels irregular.  States that he drank alcoholic drink last night states has episodes after he drinks alcohol.  He is not on anticoagulation.  States when the episode he morning felt dizzy but is feeling a lot better now.  States he feels of his heart is slowing down to.  Has seen his PCP but is not follow-up with cardiology.    Past Medical History:  Diagnosis Date  . Anxiety   . Depression   . Diverticulosis of colon   . History of atrial fibrillation without current medication    episode 2014 and 2015 in [place], [place] (copy of clinical summary scanned in epic from [facility])    . History of esophageal dilatation   . History of kidney stones   . History of kidney stones   . Hyperlipidemia    mild  . Hypertension   . Left ureteral stone   . Obsessive compulsive disorder   . Prostate cancer ([place]) UROLOGIST-  DR DALHSTEDT   Gleason 3+4,  PSA 3.93, vol 25.2cc--  s/p  prostatectomy 12-24-2013    Patient Active Problem List   Diagnosis Date Noted  . Anxiety 12/23/2019  . Adenomatous polyp of colon 11/13/2017  . History of prostate cancer 08/03/2014  . History of esophageal stricture 04/01/2013  . History of atrial fibrillation 07/28/2012  . OCD (obsessive compulsive disorder) 07/04/2011  . Allergic rhinitis, seasonal 07/04/2011  . History of renal stone 07/04/2011  . Hyperlipidemia with target LDL less than 100 07/04/2011    Past Surgical History:  Procedure Laterality Date  . COLONOSCOPY  2009    Dr. Deatra Ina  . HERNIA REPAIR  as child  . LYMPHADENECTOMY Bilateral 12/24/2013   Procedure: LYMPHADENECTOMY;  Surgeon: Raynelle Bring, MD;  Location: [facility];  Service: Urology;  Laterality: Bilateral;  . ROBOT ASSISTED LAPAROSCOPIC RADICAL PROSTATECTOMY N/A 12/24/2013   Procedure: ROBOTIC ASSISTED LAPAROSCOPIC RADICAL PROSTATECTOMY LEVEL 2;  Surgeon: Raynelle Bring, MD;  Location: [facility];  Service: Urology;  Laterality: N/A;  . TONSILLECTOMY    . WISDOM TOOTH EXTRACTION         Family History  Problem Relation Age of Onset  . Bladder Cancer Father   . Colon cancer Neg Hx   . Esophageal cancer Neg Hx   . Stomach cancer Neg Hx     Social History   Tobacco Use  . Smoking status: Former Smoker    Quit date: 04/16/2005    Years since quitting: 15.1  . Smokeless tobacco: Never Used  Vaping Use  . Vaping Use: Never used  Substance Use Topics  . Alcohol use: Yes    Alcohol/week: 14.0 standard drinks    Types: 14 Glasses of wine per week    Comment: 2glasses of wine per night per pt  . Drug use: No    Home Medications Prior to Admission medications   Medication Sig Start Date End Date Taking? Authorizing Provider  aspirin 81 MG tablet Take 81 mg by mouth daily.  [provider]  clonazePAM (KLONOPIN) 0.5 MG tablet Take 1 tablet (0.5 mg total) by mouth 2 (two) times daily as needed. 12/23/19   Denita Lung, MD  Fluvoxamine Maleate 100 MG CP24 Take 2 capsules (200 mg total) by mouth daily. 02/17/20   Denita Lung, MD  hydrochlorothiazide (HYDRODIURIL) 12.5 MG tablet TAKE 1 TABLET BY MOUTH EVERY DAY Patient not taking: Reported on 12/05/2018 08/18/18   Denita Lung, MD  ibuprofen (ADVIL,MOTRIN) 200 MG tablet Take 400 mg by mouth every 6 (six) hours as needed for headache or moderate pain.    [provider]  losartan-hydrochlorothiazide (HYZAAR) 50-12.5 MG tablet Take 1 tablet by mouth daily. 12/23/19   Denita Lung, MD  Multiple Vitamins-Minerals (MULTIVITAMIN  WITH MINERALS) tablet Take 1 tablet by mouth daily.    [provider]    Allergies    Patient has no known allergies.  Review of Systems   Review of Systems  Constitutional: Negative for appetite change and fever.  HENT: Negative for congestion.   Respiratory: Negative for shortness of breath.   Cardiovascular: Positive for palpitations. Negative for chest pain.  Gastrointestinal: Negative for abdominal distention.  Genitourinary: Negative for flank pain.  Musculoskeletal: Negative for back pain.  Skin: Negative for rash.  Neurological: Positive for dizziness and light-headedness.  Psychiatric/Behavioral: Negative for confusion.    Physical Exam Updated Vital Signs BP 125/88   Pulse 88   Temp 97.6 F (36.4 C) (Oral)   Resp 16   Ht 5\' 10"  (1.778 m)   Wt 70.3 kg   SpO2 98%   BMI 22.24 kg/m   Physical Exam Vitals and nursing note reviewed.  HENT:     Head: Atraumatic.  Eyes:     Extraocular Movements: Extraocular movements intact.     Pupils: Pupils are equal, round, and reactive to light.  Cardiovascular:     Rate and Rhythm: Normal rate and regular rhythm.  Pulmonary:     Breath sounds: No wheezing or rhonchi.  Abdominal:     Tenderness: There is no abdominal tenderness.  Musculoskeletal:        General: No tenderness.     Cervical back: Neck supple.  Skin:    General: Skin is warm.     Capillary Refill: Capillary refill takes less than 2 seconds.  Neurological:     Mental Status: He is alert and oriented to person, place, and time.     ED Results / Procedures / Treatments   Labs (all labs ordered are listed, but only abnormal results are displayed) Labs Reviewed  BASIC METABOLIC PANEL - Abnormal; Notable for the following components:      Result Value   Glucose, Bld 120 (*)    All other components within normal limits  CBC    EKG EKG Interpretation  Date/Time:  Tuesday May 24 2020 12:06:16 EST Ventricular Rate:  116 PR  Interval:    QRS Duration: 94 QT Interval:  327 QTC Calculation: 455 R Axis:   86 Text Interpretation: Sinus tachycardia Right atrial enlargement Borderline right axis deviation Borderline ST depression, diffuse leads 12 Lead; [place] Confirmed by Lacretia Leigh ([id number]) on 05/24/2020 12:17:38 PM Also confirmed by Davonna Belling [phone])  on 05/24/2020 2:57:04 PM   Radiology DG Chest 2 View  Result Date: 05/24/2020 CLINICAL DATA:  Palpitations EXAM: CHEST - 2 VIEW COMPARISON:  2015 FINDINGS: The heart size and mediastinal contours are within normal limits. Both lungs are clear. No pleural effusion. The  visualized skeletal structures are unremarkable. IMPRESSION: No acute process in the chest. Electronically Signed   By: Macy Mis M.D.   On: 05/24/2020 12:47    Procedures Procedures   Medications Ordered in ED Medications - No data to display  ED Course  I have reviewed the triage vital signs and the nursing notes.  Pertinent labs & imaging results that were available during my care of the patient were reviewed by me and considered in my medical decision making (see chart for details).    MDM Rules/Calculators/A&P                          Patient with episodic tachycardia.  This episode appears to be sinus however not the A. fib that he has had previously.  By the time he was seen by me his heart rate had returned to normal.  Basic blood work reassuring.  Discussed with patient about the risks of atrial fibrillation.  He is not on anticoagulation but does have previous episodes of it.  Will follow with his PCP cardiology or the A. fib clinic.  CHA2DS2-VASc score of 2.Will not anticoagulate at this time but can be followed as an outpatient.  Not currently in A. fib. Final Clinical Impression(s) / ED Diagnoses Final diagnoses:  Tachycardia    Rx / DC Orders ED Discharge Orders    None       Davonna Belling, MD 05/24/20 1652

## 2020-05-25 ENCOUNTER — Encounter: Payer: Self-pay | Admitting: Family Medicine

## 2020-05-25 ENCOUNTER — Other Ambulatory Visit: Payer: Self-pay

## 2020-05-25 DIAGNOSIS — R000 Tachycardia, unspecified: Secondary | ICD-10-CM

## 2020-05-25 DIAGNOSIS — Z8679 Personal history of other diseases of the circulatory system: Secondary | ICD-10-CM

## 2020-05-26 ENCOUNTER — Encounter: Payer: Self-pay | Admitting: *Deleted

## 2020-05-26 ENCOUNTER — Other Ambulatory Visit: Payer: Self-pay

## 2020-05-26 ENCOUNTER — Encounter: Payer: Self-pay | Admitting: Internal Medicine

## 2020-05-26 ENCOUNTER — Other Ambulatory Visit: Payer: Self-pay | Admitting: Internal Medicine

## 2020-05-26 ENCOUNTER — Ambulatory Visit: Payer: Medicare PPO | Admitting: Internal Medicine

## 2020-05-26 VITALS — BP 112/70 | HR 83 | Ht 70.0 in | Wt 155.0 lb

## 2020-05-26 DIAGNOSIS — I10 Essential (primary) hypertension: Secondary | ICD-10-CM | POA: Insufficient documentation

## 2020-05-26 DIAGNOSIS — E785 Hyperlipidemia, unspecified: Secondary | ICD-10-CM

## 2020-05-26 DIAGNOSIS — I4891 Unspecified atrial fibrillation: Secondary | ICD-10-CM | POA: Insufficient documentation

## 2020-05-26 DIAGNOSIS — I480 Paroxysmal atrial fibrillation: Secondary | ICD-10-CM | POA: Insufficient documentation

## 2020-05-26 HISTORY — DX: Paroxysmal atrial fibrillation: I48.0

## 2020-05-26 MED ORDER — DILTIAZEM HCL 30 MG PO TABS: 30.0000 mg | ORAL_TABLET | Freq: Four times a day (QID) | ORAL | 1 refills | Status: DC | PRN

## 2020-05-26 MED ORDER — DILTIAZEM HCL 30 MG PO TABS: ORAL_TABLET | ORAL | 1 refills | Status: DC

## 2020-05-26 MED ORDER — APIXABAN 5 MG PO TABS: 5.0000 mg | ORAL_TABLET | Freq: Two times a day (BID) | ORAL | 3 refills | Status: DC

## 2020-05-26 NOTE — Progress Notes (Signed)
Cardiology Office Note:    Date:  05/26/2020   ID:  [name] [name], DOB 1946/03/29, MRN [id number]  PCP:  Denita Lung, MD  [facility] Cardiologist:  Werner Lean, MD  [place] Electrophysiologist:  None   CC: Worsening atrial fibrillation  Consulted for the evaluation of atrial fibrillation at the behest of Denita Lung, MD  History of Present Illness:    [name] [name] is a 75 y.o. male with a hx of PAF, HTN, HLD who presents for evaluation 05/26/20.  Patient notes that he is feeling like me might have had atrial fibrillation.  On 05/24/20:  ED visit for dizziness and fast heart rate after alcoholic drink:  Found to only have sinus tachycardia (his symptoms .  Given history was asked to follow up with Cardiology.  He has had multiple episodes of AF in the past.  Before that, had episodes 6 weeks before (had at Thanksgiving and Christmas).  Each episode appears to be alcohol related.  Has had no chest pain, chest pressure, chest tightness, chest stinging.  Discomfort he feels is related to shaking and with vertigo.  Resolves in sinus rhythm.    Patient exertion notable for walking every day with  and feels no symptoms.  No shortness of breath, DOE .  No PND or orthopnea.  No bendopnea, weight gain, leg swelling , or abdominal swelling.  No syncope. Notes some residual dizziness and after his tachycardia episode.  Patient reports prior cardiac testing including 2014 echo, 2014 stress test.  Patient was seen at [facility] for AF in 2014 to 2015.  Ambulatory BP  120/70s.   Past Medical History:  Diagnosis Date  . Anxiety   . Depression   . Diverticulosis of colon   . History of atrial fibrillation without current medication    episode 2014 and 2015 in [place], [place] (copy of clinical summary scanned in epic from [place] [facility])    . History of esophageal dilatation   . History of kidney stones   . History of kidney stones   . Hyperlipidemia     mild  . Hypertension   . Left ureteral stone   . Obsessive compulsive disorder   . Prostate cancer ([place]) UROLOGIST-  DR DALHSTEDT   Gleason 3+4,  PSA 3.93, vol 25.2cc--  s/p  prostatectomy 12-24-2013    Past Surgical History:  Procedure Laterality Date  . COLONOSCOPY  2009   Dr. Deatra Ina  . HERNIA REPAIR  as child  . LYMPHADENECTOMY Bilateral 12/24/2013   Procedure: LYMPHADENECTOMY;  Surgeon: Raynelle Bring, MD;  Location: [facility];  Service: Urology;  Laterality: Bilateral;  . ROBOT ASSISTED LAPAROSCOPIC RADICAL PROSTATECTOMY N/A 12/24/2013   Procedure: ROBOTIC ASSISTED LAPAROSCOPIC RADICAL PROSTATECTOMY LEVEL 2;  Surgeon: Raynelle Bring, MD;  Location: [facility];  Service: Urology;  Laterality: N/A;  . TONSILLECTOMY    . WISDOM TOOTH EXTRACTION      Current Medications: Current Meds  Medication Sig  . apixaban (ELIQUIS) 5 MG TABS tablet Take 1 tablet (5 mg total) by mouth 2 (two) times daily.  . clonazePAM (KLONOPIN) 0.5 MG tablet Take 1 tablet (0.5 mg total) by mouth 2 (two) times daily as needed.  . Fluvoxamine Maleate 100 MG CP24 Take 2 capsules (200 mg total) by mouth daily.  Marland Kitchen ibuprofen (ADVIL,MOTRIN) 200 MG tablet Take 400 mg by mouth every 6 (six) hours as needed for headache or moderate pain.  Marland Kitchen losartan-hydrochlorothiazide (HYZAAR) 50-12.5 MG tablet Take 1 tablet by  mouth daily.  . Multiple Vitamins-Minerals (MULTIVITAMIN WITH MINERALS) tablet Take 1 tablet by mouth daily.  . [DISCONTINUED] aspirin 81 MG tablet Take 81 mg by mouth daily.  . [DISCONTINUED] diltiazem (CARDIZEM) 30 MG tablet Take for heart rate greater than 110  . [DISCONTINUED] hydrochlorothiazide (HYDRODIURIL) 12.5 MG tablet TAKE 1 TABLET BY MOUTH EVERY DAY   Current Facility-Administered Medications for the 05/26/20 encounter (Office Visit) with Werner Lean, MD  Medication  . 0.9 %  sodium chloride infusion     Allergies:   Patient has no known allergies.   Social History   Socioeconomic  History  . Marital status: Single    Spouse name: Not on file  . Number of children: Not on file  . Years of education: Not on file  . Highest education level: Not on file  Occupational History  . Not on file  Tobacco Use  . Smoking status: Former Smoker    Quit date: 04/16/2005    Years since quitting: 15.1  . Smokeless tobacco: Never Used  Vaping Use  . Vaping Use: Never used  Substance and Sexual Activity  . Alcohol use: Yes    Alcohol/week: 14.0 standard drinks    Types: 14 Glasses of wine per week    Comment: 2glasses of wine per night per pt  . Drug use: No  . Sexual activity: Not Currently  Other Topics Concern  . Not on file  Social History Narrative  . Not on file   Social Determinants of Health   Financial Resource Strain: Not on file  Food Insecurity: Not on file  Transportation Needs: Not on file  Physical Activity: Not on file  Stress: Not on file  Social Connections: Not on file     Family History: The patient's family history includes Bladder Cancer in his father. There is no history of Colon cancer, Esophageal cancer, or Stomach cancer. History of coronary artery disease notable for no members. History of heart failure notable for mother. History of arrhythmia notable for no members.  ROS:   Please see the history of present illness.     All other systems reviewed and are negative.  EKGs/Labs/Other Studies Reviewed:    The following studies were reviewed today:  EKG:   05/24/20 Sinus Tachycardia rate 118 Right Atrial Enlargement  Recent Labs: 12/23/2019: ALT 19 05/24/2020: BUN 14; Creatinine, Ser 1.00; Hemoglobin 16.4; Platelets 168; Potassium 3.6; Sodium 138  Recent Lipid Panel    Component Value Date/Time   CHOL 261 (H) 12/23/2019 1510   TRIG 105 12/23/2019 1510   HDL 84 12/23/2019 1510   CHOLHDL 3.1 12/23/2019 1510   CHOLHDL 2.9 11/08/2016 1551   VLDL 23 11/08/2016 1551   LDLCALC 159 (H) 12/23/2019 1510     Risk Assessment/Calculations:     [id number] Score = 2  {Click here to calculate score.  REFRESH note before signing.       :[id number]}TKWI indicates a 2.2% annual risk of stroke. The patient's score is based upon: CHF History: No HTN History: Yes Diabetes History: No Stroke History: No Vascular Disease History: No Age Score: 1 Gender Score: 0      Physical Exam:    VS:  BP 112/70   Pulse 83   Ht 5\' 10"  (1.778 m)   Wt 155 lb (70.3 kg)   SpO2 98%   BMI 22.24 kg/m     Wt Readings from Last 3 Encounters:  05/26/20 155 lb (70.3 kg)  05/24/20 155 lb (70.3 kg)  12/23/19 155 lb (70.3 kg)     GEN:  Well nourished, well developed in no acute distress HEENT: Normal NECK: No JVD; No carotid bruits LYMPHATICS: No lymphadenopathy CARDIAC: RRR, no murmurs, rubs, gallops RESPIRATORY:  Clear to auscultation without rales, wheezing or rhonchi  ABDOMEN: Soft, non-tender, non-distended MUSCULOSKELETAL:  No edema; No deformity  SKIN: Warm and dry NEUROLOGIC:  Alert and oriented x 3; notable right eye tick PSYCHIATRIC:  Normal affect   ASSESSMENT:    1. PAF (paroxysmal atrial fibrillation) ([place])   2. Essential hypertension   3. Hyperlipidemia with target LDL less than 100    PLAN:    In order of problems listed above:  Paroxysmal Atrial Fibrillation - Risk factors include HTN - CHADSVASC=2. - discussed alcohol; and exercise as preventive factors - Will get obtain TTE.   - Start anticoagulation with eliquis 5 mg BID.  - Continue rate control with PRN diltiazem 30 mg  - Will assess burden with 30 day heart monitor   Essential Hypertension - ambulatory blood pressure 112/70, will continue ambulatory BP monitoring; gave education on how to perform ambulatory blood pressure monitoring including the frequency and technique; goal ambulatory blood pressure < 135/85 on average - continue home medications with the exception of stopping HCTZ (Keep Hyzaar) - discussed diet (DASH/low sodium), and exercise/weight  loss interventions   HLD - will address in future visits     Medication Adjustments/Labs and Tests Ordered: Current medicines are reviewed at length with the patient today.  Concerns regarding medicines are outlined above.  Orders Placed This Encounter  Procedures  . CARDIAC EVENT MONITOR  . ECHOCARDIOGRAM COMPLETE   Meds ordered this encounter  Medications  . DISCONTD: diltiazem (CARDIZEM) 30 MG tablet    Sig: Take for heart rate greater than 110    Dispense:  90 tablet    Refill:  1  . apixaban (ELIQUIS) 5 MG TABS tablet    Sig: Take 1 tablet (5 mg total) by mouth 2 (two) times daily.    Dispense:  180 tablet    Refill:  3  . diltiazem (CARDIZEM) 30 MG tablet    Sig: Take 1 tablet (30 mg total) by mouth 4 (four) times daily as needed (heart rate greater than 110).    Dispense:  90 tablet    Refill:  1    Patient Instructions  Medication Instructions:  Your physician has recommended you make the following change in your medication:  STOP: Aspirin 81mg   STOP : HCTZ hydrochlorthiazide   START: eliquis 5mg  twice daily  START: diltiazem (Cardizem) 30mg  as needed 4 times daily for a Heart rate greater than 110  *If you need a refill on your cardiac medications before your next appointment, please call your pharmacy*   Lab Work: NONE If you have labs (blood work) drawn today and your tests are completely normal, you will receive your results only by: Marland Kitchen MyChart Message (if you have MyChart) OR . A paper copy in the mail If you have any lab test that is abnormal or we need to change your treatment, we will call you to review the results.   Testing/Procedures: Your physican has requested that you wear a 30 day heart monitor.  Your physician has requested that you have an echocardiogram. Echocardiography is a painless test that uses sound waves to create images of your heart. It provides your doctor with information about the size and shape of your heart and how well  your heart's chambers and  valves are working. This procedure takes approximately one hour. There are no restrictions for this procedure.     Follow-Up: At [facility], you and your health needs are our priority.  As part of our continuing mission to provide you with exceptional heart care, we have created designated Provider Care Teams.  These Care Teams include your primary Cardiologist (physician) and Advanced Practice Providers (APPs -  Physician Assistants and Nurse Practitioners) who all work together to provide you with the care you need, when you need it.  We recommend signing up for the patient portal called "MyChart".  Sign up information is provided on this After Visit Summary.  MyChart is used to connect with patients for Virtual Visits (Telemedicine).  Patients are able to view lab/test results, encounter notes, upcoming appointments, etc.  Non-urgent messages can be sent to your provider as well.   To learn more about what you can do with MyChart, go to [email or web address].    Your next appointment:   2 month(s)  The format for your next appointment:   In Person  Provider:   You may see Gasper Sells, MD or one of the following Advanced Practice Providers on your designated Care Team:    Melina Copa, PA-C  Ermalinda Barrios, PA-C    Other Instructions [employer] Cardiac Event Monitor Instructions Your physician has requested you wear your cardiac event monitor for ___30__ days. [employer] may call or text to confirm a shipping address. The monitor will be sent to a land address via UPS. [employer] will not ship a monitor to a PO BOX. It typically takes 3-5 days to receive your monitor after it has been enrolled. [employer] will assist with USPS tracking if your package is delayed. The telephone number for [employer] is [phone]. Once you have received your monitor, please review the enclosed instructions. Instruction tutorials can also be viewed under help and  settings on the enclosed cell phone. Your monitor has already been registered assigning a specific monitor serial # to you.  Applying the monitor Remove cell phone from case and turn it on. The cell phone works as Dealer and needs to be within [employer] of you at all times. The cell phone will need to be charged on a daily basis. We recommend you plug the cell phone into the enclosed charger at your bedside table every night.  Monitor batteries: You will receive two monitor batteries labelled #1 and #2. These are your recorders. Plug battery #2 onto the second connection on the enclosed charger. Keep one battery on the charger at all times. This will keep the monitor battery deactivated. It will also keep it fully charged for when you need to switch your monitor batteries. A small light will be blinking on the battery emblem when it is charging. The light on the battery emblem will remain on when the battery is fully charged.  Open package of a Monitor strip. Insert battery #1 into black hood on strip and gently squeeze monitor battery onto connection as indicated in instruction booklet. Set aside while preparing skin.  Choose location for your strip, vertical or horizontal, as indicated in the instruction booklet. Shave to remove all hair from location. There cannot be any lotions, oils, powders, or colognes on skin where monitor is to be applied. Wipe skin clean with enclosed Saline wipe. Dry skin completely.  Peel paper labeled #1 off the back of the Monitor strip exposing the adhesive. Place the monitor on the chest in the vertical or horizontal  position shown in the instruction booklet. One arrow on the monitor strip must be pointing upward. Carefully remove paper labeled #2, attaching remainder of strip to your skin. Try not to create any folds or wrinkles in the strip as you apply it.  Firmly press and release the circle in the center of the monitor battery. You will hear a  small beep. This is turning the monitor battery on. The heart emblem on the monitor battery will light up every 5 seconds if the monitor battery in turned on and connected to the patient securely. Do not push and hold the circle down as this turns the monitor battery off. The cell phone will locate the monitor battery. A screen will appear on the cell phone checking the connection of your monitor strip. This may read poor connection initially but change to good connection within the next minute. Once your monitor accepts the connection you will hear a series of 3 beeps followed by a climbing crescendo of beeps. A screen will appear on the cell phone showing the two monitor strip placement options. Touch the picture that demonstrates where you applied the monitor strip.  Your monitor strip and battery are waterproof. You are able to shower, bathe, or swim with the monitor on. They just ask you do not submerge deeper than 3 feet underwater. We recommend removing the monitor if you are swimming in a lake, river, or ocean.  Your monitor battery will need to be switched to a fully charged monitor battery approximately once a week. The cell phone will alert you of an action which needs to be made.  On the cell phone, tap for details to reveal connection status, monitor battery status, and cell phone battery status. The green dots indicates your monitor is in good status. A red dot indicates there is something that needs your attention.  To record a symptom, click the circle on the monitor battery. In 30-60 seconds a list of symptoms will appear on the cell phone. Select your symptom and tap save. Your monitor will record a sustained or significant arrhythmia regardless of you clicking the button. Some patients do not feel the heart rhythm irregularities. [employer] will notify us of any serious or critical events.  Refer to instruction booklet for instructions on switching batteries, changing  strips, the Do not disturb or Pause features, or any additional questions.  Call [employer] at [phone], to confirm your monitor is transmitting and record your baseline. They will answer any questions you may have regarding the monitor instructions at that time.  Returning the monitor to [place] all equipment back into blue box. Peel off strip of paper to expose adhesive and close box securely. There is a prepaid UPS shipping label on this box. Drop in a UPS drop box, or at a UPS facility like [employer]. You may also contact [employer] to arrange UPS to pick up monitor package at your home.      Signed, Werner Lean, MD  05/26/2020 2:37 PM    [place]

## 2020-05-26 NOTE — Patient Instructions (Addendum)
Medication Instructions:  Your physician has recommended you make the following change in your medication:  STOP: Aspirin 81mg   STOP : HCTZ hydrochlorthiazide   START: eliquis 5mg  twice daily  START: diltiazem (Cardizem) 30mg  as needed 4 times daily for a Heart rate greater than 110  *If you need a refill on your cardiac medications before your next appointment, please call your pharmacy*   Lab Work: NONE If you have labs (blood work) drawn today and your tests are completely normal, you will receive your results only by: Marland Kitchen MyChart Message (if you have MyChart) OR . A paper copy in the mail If you have any lab test that is abnormal or we need to change your treatment, we will call you to review the results.   Testing/Procedures: Your physican has requested that you wear a 30 day heart monitor.  Your physician has requested that you have an echocardiogram. Echocardiography is a painless test that uses sound waves to create images of your heart. It provides your doctor with information about the size and shape of your heart and how well your heart's chambers and valves are working. This procedure takes approximately one hour. There are no restrictions for this procedure.     Follow-Up: At [facility], you and your health needs are our priority.  As part of our continuing mission to provide you with exceptional heart care, we have created designated Provider Care Teams.  These Care Teams include your primary Cardiologist (physician) and Advanced Practice Providers (APPs -  Physician Assistants and Nurse Practitioners) who all work together to provide you with the care you need, when you need it.  We recommend signing up for the patient portal called "MyChart".  Sign up information is provided on this After Visit Summary.  MyChart is used to connect with patients for Virtual Visits (Telemedicine).  Patients are able to view lab/test results, encounter notes, upcoming appointments, etc.   Non-urgent messages can be sent to your provider as well.   To learn more about what you can do with MyChart, go to [email or web address].    Your next appointment:   2 month(s)  The format for your next appointment:   In Person  Provider:   You may see Gasper Sells, MD or one of the following Advanced Practice Providers on your designated Care Team:    Melina Copa, PA-C  Ermalinda Barrios, PA-C    Other Instructions [employer] Cardiac Event Monitor Instructions Your physician has requested you wear your cardiac event monitor for ___30__ days. [employer] may call or text to confirm a shipping address. The monitor will be sent to a land address via UPS. [employer] will not ship a monitor to a PO BOX. It typically takes 3-5 days to receive your monitor after it has been enrolled. [employer] will assist with USPS tracking if your package is delayed. The telephone number for [employer] is [phone]. Once you have received your monitor, please review the enclosed instructions. Instruction tutorials can also be viewed under help and settings on the enclosed cell phone. Your monitor has already been registered assigning a specific monitor serial # to you.  Applying the monitor Remove cell phone from case and turn it on. The cell phone works as Dealer and needs to be within [employer] of you at all times. The cell phone will need to be charged on a daily basis. We recommend you plug the cell phone into the enclosed charger at your bedside table every night.  Monitor batteries: You  will receive two monitor batteries labelled #1 and #2. These are your recorders. Plug battery #2 onto the second connection on the enclosed charger. Keep one battery on the charger at all times. This will keep the monitor battery deactivated. It will also keep it fully charged for when you need to switch your monitor batteries. A small light will be blinking on the battery emblem when it is charging.  The light on the battery emblem will remain on when the battery is fully charged.  Open package of a Monitor strip. Insert battery #1 into black hood on strip and gently squeeze monitor battery onto connection as indicated in instruction booklet. Set aside while preparing skin.  Choose location for your strip, vertical or horizontal, as indicated in the instruction booklet. Shave to remove all hair from location. There cannot be any lotions, oils, powders, or colognes on skin where monitor is to be applied. Wipe skin clean with enclosed Saline wipe. Dry skin completely.  Peel paper labeled #1 off the back of the Monitor strip exposing the adhesive. Place the monitor on the chest in the vertical or horizontal position shown in the instruction booklet. One arrow on the monitor strip must be pointing upward. Carefully remove paper labeled #2, attaching remainder of strip to your skin. Try not to create any folds or wrinkles in the strip as you apply it.  Firmly press and release the circle in the center of the monitor battery. You will hear a small beep. This is turning the monitor battery on. The heart emblem on the monitor battery will light up every 5 seconds if the monitor battery in turned on and connected to the patient securely. Do not push and hold the circle down as this turns the monitor battery off. The cell phone will locate the monitor battery. A screen will appear on the cell phone checking the connection of your monitor strip. This may read poor connection initially but change to good connection within the next minute. Once your monitor accepts the connection you will hear a series of 3 beeps followed by a climbing crescendo of beeps. A screen will appear on the cell phone showing the two monitor strip placement options. Touch the picture that demonstrates where you applied the monitor strip.  Your monitor strip and battery are waterproof. You are able to shower, bathe, or swim  with the monitor on. They just ask you do not submerge deeper than 3 feet underwater. We recommend removing the monitor if you are swimming in a lake, river, or ocean.  Your monitor battery will need to be switched to a fully charged monitor battery approximately once a week. The cell phone will alert you of an action which needs to be made.  On the cell phone, tap for details to reveal connection status, monitor battery status, and cell phone battery status. The green dots indicates your monitor is in good status. A red dot indicates there is something that needs your attention.  To record a symptom, click the circle on the monitor battery. In 30-60 seconds a list of symptoms will appear on the cell phone. Select your symptom and tap save. Your monitor will record a sustained or significant arrhythmia regardless of you clicking the button. Some patients do not feel the heart rhythm irregularities. [employer] will notify us of any serious or critical events.  Refer to instruction booklet for instructions on switching batteries, changing strips, the Do not disturb or Pause features, or any additional questions.  Call [employer] at [phone], to confirm your monitor is transmitting and record your baseline. They will answer any questions you may have regarding the monitor instructions at that time.  Returning the monitor to [place] all equipment back into blue box. Peel off strip of paper to expose adhesive and close box securely. There is a prepaid UPS shipping label on this box. Drop in a UPS drop box, or at a UPS facility like [employer]. You may also contact [employer] to arrange UPS to pick up monitor package at your home.

## 2020-05-26 NOTE — Progress Notes (Signed)
Patient ID: [name] [name], male   DOB: [birth year], 75 y.o.   MRN: [id number] Patient enrolled for [employer] to ship a 30 day cardiac event monitor to his home.

## 2020-05-31 ENCOUNTER — Encounter: Payer: Self-pay | Admitting: Internal Medicine

## 2020-05-31 ENCOUNTER — Encounter (INDEPENDENT_AMBULATORY_CARE_PROVIDER_SITE_OTHER): Payer: Medicare PPO

## 2020-05-31 DIAGNOSIS — I480 Paroxysmal atrial fibrillation: Secondary | ICD-10-CM

## 2020-06-08 DIAGNOSIS — H2513 Age-related nuclear cataract, bilateral: Secondary | ICD-10-CM | POA: Diagnosis not present

## 2020-06-08 DIAGNOSIS — H52203 Unspecified astigmatism, bilateral: Secondary | ICD-10-CM | POA: Diagnosis not present

## 2020-06-16 DIAGNOSIS — H2512 Age-related nuclear cataract, left eye: Secondary | ICD-10-CM | POA: Diagnosis not present

## 2020-06-16 DIAGNOSIS — H25812 Combined forms of age-related cataract, left eye: Secondary | ICD-10-CM | POA: Diagnosis not present

## 2020-06-21 ENCOUNTER — Encounter: Payer: Medicare PPO | Admitting: Family Medicine

## 2020-06-27 ENCOUNTER — Telehealth: Payer: Self-pay | Admitting: *Deleted

## 2020-06-27 NOTE — Telephone Encounter (Signed)
Left message to call back  

## 2020-06-27 NOTE — Telephone Encounter (Addendum)
[facility]iac Report: 06/24/20 at 8:19 pm, [facility].  Noted spoke with on call doctor at 2321 for first document of atrial fibrillation.   Doctor of the day reviewed and signed. Probable Afib w/RVR, on eliquis and diltiazem prn.   Patient doesn't note any S&S that night. He did note that he had cataract procedure that morning.  Advised to continue to monitor, take eliquis, and diltiazem (prn).  Patient verbalized understanding.  Placed to be scanned into epic.

## 2020-06-27 NOTE — Telephone Encounter (Signed)
Mozes is returning Tina's phone call. Please advise.

## 2020-06-29 NOTE — Telephone Encounter (Signed)
Agree with the above.  Will continue AC and CCB pending full report.  Werner Lean, MD

## 2020-06-30 ENCOUNTER — Ambulatory Visit: Payer: Medicare PPO | Admitting: Family Medicine

## 2020-06-30 ENCOUNTER — Other Ambulatory Visit: Payer: Self-pay

## 2020-06-30 ENCOUNTER — Encounter: Payer: Self-pay | Admitting: Family Medicine

## 2020-06-30 VITALS — BP 118/80 | HR 66 | Temp 97.4°F | Wt 157.4 lb

## 2020-06-30 DIAGNOSIS — F429 Obsessive-compulsive disorder, unspecified: Secondary | ICD-10-CM | POA: Diagnosis not present

## 2020-06-30 DIAGNOSIS — Z8679 Personal history of other diseases of the circulatory system: Secondary | ICD-10-CM

## 2020-06-30 DIAGNOSIS — Z23 Encounter for immunization: Secondary | ICD-10-CM

## 2020-06-30 DIAGNOSIS — D126 Benign neoplasm of colon, unspecified: Secondary | ICD-10-CM

## 2020-06-30 NOTE — Progress Notes (Signed)
   Subjective:    Patient ID: [name] [name], male    DOB: [birth year], 75 y.o.   MRN: [id number]  HPI He is here for a med check appointment.  He recently had difficulty with atrial fibrillation and relates that this is because of increased alcohol consumption.  He has had difficulty with this in the past.  He recently finished cardiac monitoring.  He had a CHADS2 score of 2.  Presently he is taking Eliquis.  He also has diltiazem as needed elevated heart rate.  He continues on Luvox for his OCD and is doing well on it.  Continues on losartan/HCTZ for his blood pressure.  Review of the record indicates he has colonic polyps and will need to have another colonoscopy.  Also he is due for Tdap.   Review of Systems     Objective:   Physical Exam Alert and in no distress.  Cardiac exam shows a regular sinus rhythm without murmurs or gallops.       Assessment & Plan:  History of atrial fibrillation  Obsessive-compulsive disorder, unspecified type  Adenomatous polyp of colon, unspecified part of colon  Need for Tdap vaccination He will continue to be followed by cardiology.  Discussed the need for him to avoid alcohol as this is a trigger for him getting the atrial fib.  He recognizes the importance of this. He will continue on Luvox. He will follow up with gastroenterology concerning getting another colonoscopy. Recommend he get Tdap at the drugstore

## 2020-07-11 ENCOUNTER — Other Ambulatory Visit: Payer: Self-pay

## 2020-07-11 ENCOUNTER — Ambulatory Visit (HOSPITAL_COMMUNITY): Payer: Medicare PPO | Attending: Internal Medicine

## 2020-07-11 DIAGNOSIS — E785 Hyperlipidemia, unspecified: Secondary | ICD-10-CM | POA: Insufficient documentation

## 2020-07-11 DIAGNOSIS — I480 Paroxysmal atrial fibrillation: Secondary | ICD-10-CM | POA: Insufficient documentation

## 2020-07-11 DIAGNOSIS — I10 Essential (primary) hypertension: Secondary | ICD-10-CM | POA: Diagnosis not present

## 2020-07-11 LAB — ECHOCARDIOGRAM COMPLETE
Area-P 1/2: 2.69 cm2
S' Lateral: 2 cm

## 2020-07-19 ENCOUNTER — Encounter: Payer: Self-pay | Admitting: Family Medicine

## 2020-08-01 NOTE — Progress Notes (Signed)
Cardiology Office Note:    Date:  08/04/2020   ID:  [name] [name], DOB 1945/05/17, MRN [id number]  PCP:  Denita Lung, MD  [facility] Cardiologist:  Werner Lean, MD  [place] Electrophysiologist:  None   CC: AF follow up  History of Present Illness:    [name] [name] is a 75 y.o. male with a hx of PAF, HTN, HLD who presents for evaluation 05/26/20.  In interim of this visit, patient echo and heart monitor with runs of AF.  Has   Patient notes that he is doing fine.  Since day last visit notes  When traveling abroad in [place] has symptomatic AF twice; improved with the diltiazem.  Relevant interval testing or therapy include echocardiogram.  There are no interval hospital/ED visit.    No chest pain or pressure.  No SOB/DOE and no PND/Orthopnea.  No weight gain or leg swelling.  No palpitations (outside of [place] spell) or syncope .  Ambulatory blood pressure 120/80.   Past Medical History:  Diagnosis Date  . Adenomatous polyp of colon 11/13/2017  . Anxiety   . Depression   . Diverticulosis of colon   . History of atrial fibrillation 07/28/2012  . History of atrial fibrillation without current medication    episode 2014 and 2015 in [place], [place] (copy of clinical summary scanned in epic from [place] [facility])    . History of esophageal dilatation   . History of esophageal stricture 04/01/2013  . History of kidney stones   . History of kidney stones   . Hyperlipidemia    mild  . Hyperlipidemia with target LDL less than 100 07/04/2011  . Hypertension   . Left ureteral stone   . Obsessive compulsive disorder   . OCD (obsessive compulsive disorder) 07/04/2011  . PAF (paroxysmal atrial fibrillation) ([place]) 05/26/2020  . Prostate cancer ([place]) UROLOGIST-  DR DALHSTEDT   Gleason 3+4,  PSA 3.93, vol 25.2cc--  s/p  prostatectomy 12-24-2013    Past Surgical History:  Procedure Laterality Date  . COLONOSCOPY  2009   Dr. Deatra Ina  . HERNIA REPAIR  as  child  . LYMPHADENECTOMY Bilateral 12/24/2013   Procedure: LYMPHADENECTOMY;  Surgeon: Raynelle Bring, MD;  Location: [facility];  Service: Urology;  Laterality: Bilateral;  . ROBOT ASSISTED LAPAROSCOPIC RADICAL PROSTATECTOMY N/A 12/24/2013   Procedure: ROBOTIC ASSISTED LAPAROSCOPIC RADICAL PROSTATECTOMY LEVEL 2;  Surgeon: Raynelle Bring, MD;  Location: [facility];  Service: Urology;  Laterality: N/A;  . TONSILLECTOMY    . WISDOM TOOTH EXTRACTION      Current Medications: Current Meds  Medication Sig  . apixaban (ELIQUIS) 5 MG TABS tablet Take 1 tablet (5 mg total) by mouth 2 (two) times daily.  . clonazePAM (KLONOPIN) 0.5 MG tablet Take 1 tablet (0.5 mg total) by mouth 2 (two) times daily as needed.  . diltiazem (CARDIZEM) 30 MG tablet Take 1 tablet (30 mg total) by mouth 4 (four) times daily as needed (heart rate greater than 110).  . Fluvoxamine Maleate 100 MG CP24 Take 2 capsules (200 mg total) by mouth daily.  Marland Kitchen ibuprofen (ADVIL,MOTRIN) 200 MG tablet Take 400 mg by mouth every 6 (six) hours as needed for headache or moderate pain.  Marland Kitchen losartan-hydrochlorothiazide (HYZAAR) 50-12.5 MG tablet Take 1 tablet by mouth daily.  . Multiple Vitamins-Minerals (MULTIVITAMIN WITH MINERALS) tablet Take 1 tablet by mouth daily.   Current Facility-Administered Medications for the 08/04/20 encounter (Office Visit) with Werner Lean, MD  Medication  . 0.9 %  sodium chloride infusion     Allergies:   Patient has no known allergies.   Social History   Socioeconomic History  . Marital status: Single    Spouse name: Not on file  . Number of children: Not on file  . Years of education: Not on file  . Highest education level: Not on file  Occupational History  . Not on file  Tobacco Use  . Smoking status: Former Smoker    Quit date: 04/16/2005    Years since quitting: 15.3  . Smokeless tobacco: Never Used  Vaping Use  . Vaping Use: Never used  Substance and Sexual Activity  . Alcohol use: Yes     Alcohol/week: 14.0 standard drinks    Types: 14 Glasses of wine per week    Comment: 2glasses of wine per night per pt  . Drug use: No  . Sexual activity: Not Currently  Other Topics Concern  . Not on file  Social History Narrative  . Not on file   Social Determinants of Health   Financial Resource Strain: Not on file  Food Insecurity: Not on file  Transportation Needs: Not on file  Physical Activity: Not on file  Stress: Not on file  Social Connections: Not on file     Family History: The patient's family history includes Bladder Cancer in his father. There is no history of Colon cancer, Esophageal cancer, or Stomach cancer. History of coronary artery disease notable for no members. History of heart failure notable for mother. History of arrhythmia notable for no members.  ROS:   Please see the history of present illness.     All other systems reviewed and are negative.  EKGs/Labs/Other Studies Reviewed:    The following studies were reviewed today:  EKG:   05/24/20 Sinus Tachycardia rate 118 Right Atrial Enlargement  Cardiac Event Monitoring: Date: 08/01/20 Results:  Patient had a minimum heart rate of 37 bpm, maximum heart rate of 135 bpm, and average heart rate of 57 bpm.  Predominant underlying rhythm was sinus rhythm.  One run of atrial fibrillation occurred lasting 51 seconds with a max rate of 121 bpm at fastest.  Isolated PACs were rare (<1.0%).  Isolated PVCs were rare (<1.0%).  No evidence of complete heart block.  Triggered and diary events associated with sinus rhythm.   Asymptomatic, short run of atrial fibrillation.   Transthoracic Echocardiogram: Date: 07/11/20 Results: 1. Left ventricular ejection fraction, by estimation, is 65 to 70%. The  left ventricle has normal function. The left ventricle has no regional  wall motion abnormalities. Left ventricular diastolic parameters were  normal.  2. Right ventricular systolic function is  normal. The right ventricular  size is normal.  3. The mitral valve is normal in structure. Trivial mitral valve  regurgitation.  4. The aortic valve is tricuspid. Aortic valve regurgitation is not  visualized. Mild to moderate aortic valve sclerosis/calcification is  present, without any evidence of aortic stenosis.  5. The inferior vena cava is normal in size with greater than 50%  respiratory variability, suggesting right atrial pressure of 3 mmHg.    Recent Labs: 12/23/2019: ALT 19 05/24/2020: BUN 14; Creatinine, Ser 1.00; Hemoglobin 16.4; Platelets 168; Potassium 3.6; Sodium 138  Recent Lipid Panel    Component Value Date/Time   CHOL 261 (H) 12/23/2019 1510   TRIG 105 12/23/2019 1510   HDL 84 12/23/2019 1510   CHOLHDL 3.1 12/23/2019 1510   CHOLHDL 2.9 11/08/2016 1551   VLDL 23 11/08/2016 1551  LDLCALC 159 (H) 12/23/2019 1510     Risk Assessment/Calculations:    CHA2DS2-VASc Score = 2  This indicates a 2.2% annual risk of stroke. The patient's score is based upon: CHF History: No HTN History: Yes Diabetes History: No Stroke History: No Vascular Disease History: No Age Score: 1 Gender Score: 0     The 10-year ASCVD risk score Mikey Bussing DC Jr., et al., 2013) is: 17%   Values used to calculate the score:     Age: 85 years     Sex: Male     Is Non-Hispanic African American: No     Diabetic: No     Tobacco smoker: No     Systolic Blood Pressure: 161 mmHg     Is BP treated: Yes     HDL Cholesterol: 84 mg/dL     Total Cholesterol: 261 mg/dL   Physical Exam:    VS:  BP 110/90   Pulse 60   Ht 5\' 10"  (1.778 m)   Wt 156 lb 9.6 oz (71 kg)   SpO2 99%   BMI 22.47 kg/m     Wt Readings from Last 3 Encounters:  08/04/20 156 lb 9.6 oz (71 kg)  06/30/20 157 lb 6.4 oz (71.4 kg)  05/26/20 155 lb (70.3 kg)    GEN:  Well nourished, well developed in no acute distress HEENT: Normal NECK: No JVD; No carotid bruits LYMPHATICS: No lymphadenopathy CARDIAC: RRR, no  murmurs, rubs, gallops RESPIRATORY:  Clear to auscultation without rales, wheezing or rhonchi  ABDOMEN: Soft, non-tender, non-distended MUSCULOSKELETAL:  No edema; No deformity  SKIN: Warm and dry NEUROLOGIC:  Alert and oriented x 3; notable right eye tick PSYCHIATRIC:  Normal affect   ASSESSMENT:    1. PAF (paroxysmal atrial fibrillation) ([place])   2. Essential hypertension   3. Hyperlipidemia with target LDL less than 100    PLAN:    In order of problems listed above:  Paroxysmal Atrial Fibrillation - Risk factors include HTN - CHADSVASC=2. - discussed alcohol again; and exercise as preventive factors - eliquis 5 mg BID; CBC - Continue rate control with PRN diltiazem 30 mg    Essential Hypertension - ambulatory blood pressure 120/80, will continue ambulatory BP monitoring; gave education on how to perform ambulatory blood pressure monitoring including the frequency and technique; goal ambulatory blood pressure < 130/80 on average - continue home medications - discussed diet (DASH/low sodium), and exercise/weight loss interventions   HLD - discussed both empiric therapy and discussed CAC scare- will get scan and decided on statin based on this - ASCVD 17%   Fall follow up unless new symptoms or abnormal test results warranting change in plan  Would be reasonable for  APP Follow up     Medication Adjustments/Labs and Tests Ordered: Current medicines are reviewed at length with the patient today.  Concerns regarding medicines are outlined above.  Orders Placed This Encounter  Procedures  . CT CARDIAC SCORING (SELF PAY ONLY)   No orders of the defined types were placed in this encounter.   Patient Instructions  Medication Instructions:  Your physician recommends that you continue on your current medications as directed. Please refer to the Current Medication list given to you today.  *If you need a refill on your cardiac medications before your next appointment,  please call your pharmacy*   Lab Work: NONE If you have labs (blood work) drawn today and your tests are completely normal, you will receive your results only by: Marland Kitchen MyChart  Message (if you have MyChart) OR . A paper copy in the mail If you have any lab test that is abnormal or we need to change your treatment, we will call you to review the results.   Testing/Procedures: Your physician has requested that you have a Coronary Calcium Score.    Follow-Up: At [facility], you and your health needs are our priority.  As part of our continuing mission to provide you with exceptional heart care, we have created designated Provider Care Teams.  These Care Teams include your primary Cardiologist (physician) and Advanced Practice Providers (APPs -  Physician Assistants and Nurse Practitioners) who all work together to provide you with the care you need, when you need it.  We recommend signing up for the patient portal called "MyChart".  Sign up information is provided on this After Visit Summary.  MyChart is used to connect with patients for Virtual Visits (Telemedicine).  Patients are able to view lab/test results, encounter notes, upcoming appointments, etc.  Non-urgent messages can be sent to your provider as well.   To learn more about what you can do with MyChart, go to [email or web address].    Your next appointment:   5-7 month(s)  The format for your next appointment:   In Person  Provider:   You may see Werner Lean, MD or one of the following Advanced Practice Providers on your designated Care Team:    Melina Copa, PA-C  Ermalinda Barrios, PA-C          Signed, Werner Lean, MD  08/04/2020 11:02 AM    [place]

## 2020-08-04 ENCOUNTER — Ambulatory Visit: Payer: Medicare PPO | Admitting: Internal Medicine

## 2020-08-04 ENCOUNTER — Encounter: Payer: Self-pay | Admitting: Internal Medicine

## 2020-08-04 ENCOUNTER — Other Ambulatory Visit: Payer: Self-pay

## 2020-08-04 VITALS — BP 110/90 | HR 60 | Ht 70.0 in | Wt 156.6 lb

## 2020-08-04 DIAGNOSIS — I10 Essential (primary) hypertension: Secondary | ICD-10-CM

## 2020-08-04 DIAGNOSIS — I480 Paroxysmal atrial fibrillation: Secondary | ICD-10-CM

## 2020-08-04 DIAGNOSIS — E785 Hyperlipidemia, unspecified: Secondary | ICD-10-CM

## 2020-08-04 NOTE — Patient Instructions (Signed)
Medication Instructions:  Your physician recommends that you continue on your current medications as directed. Please refer to the Current Medication list given to you today.  *If you need a refill on your cardiac medications before your next appointment, please call your pharmacy*   Lab Work: NONE If you have labs (blood work) drawn today and your tests are completely normal, you will receive your results only by: Marland Kitchen MyChart Message (if you have MyChart) OR . A paper copy in the mail If you have any lab test that is abnormal or we need to change your treatment, we will call you to review the results.   Testing/Procedures: Your physician has requested that you have a Coronary Calcium Score.    Follow-Up: At [facility], you and your health needs are our priority.  As part of our continuing mission to provide you with exceptional heart care, we have created designated Provider Care Teams.  These Care Teams include your primary Cardiologist (physician) and Advanced Practice Providers (APPs -  Physician Assistants and Nurse Practitioners) who all work together to provide you with the care you need, when you need it.  We recommend signing up for the patient portal called "MyChart".  Sign up information is provided on this After Visit Summary.  MyChart is used to connect with patients for Virtual Visits (Telemedicine).  Patients are able to view lab/test results, encounter notes, upcoming appointments, etc.  Non-urgent messages can be sent to your provider as well.   To learn more about what you can do with MyChart, go to [email or web address].    Your next appointment:   5-7 month(s)  The format for your next appointment:   In Person  Provider:   You may see Werner Lean, MD or one of the following Advanced Practice Providers on your designated Care Team:    Melina Copa, PA-C  Ermalinda Barrios, PA-C

## 2020-08-13 ENCOUNTER — Other Ambulatory Visit: Payer: Self-pay | Admitting: Internal Medicine

## 2020-09-08 ENCOUNTER — Other Ambulatory Visit: Payer: Self-pay

## 2020-09-08 ENCOUNTER — Ambulatory Visit (INDEPENDENT_AMBULATORY_CARE_PROVIDER_SITE_OTHER)
Admission: RE | Admit: 2020-09-08 | Discharge: 2020-09-08 | Disposition: A | Discharge status: Home / Self Care | Payer: Self-pay | Source: Ambulatory Visit | Attending: Internal Medicine | Admitting: Internal Medicine

## 2020-09-08 DIAGNOSIS — I480 Paroxysmal atrial fibrillation: Secondary | ICD-10-CM

## 2020-09-29 DIAGNOSIS — H25811 Combined forms of age-related cataract, right eye: Secondary | ICD-10-CM | POA: Diagnosis not present

## 2020-09-29 DIAGNOSIS — H2511 Age-related nuclear cataract, right eye: Secondary | ICD-10-CM | POA: Diagnosis not present

## 2020-09-30 ENCOUNTER — Encounter: Payer: Self-pay | Admitting: Gastroenterology

## 2020-09-30 ENCOUNTER — Ambulatory Visit (INDEPENDENT_AMBULATORY_CARE_PROVIDER_SITE_OTHER): Payer: Medicare PPO | Admitting: Gastroenterology

## 2020-09-30 ENCOUNTER — Telehealth: Payer: Self-pay | Admitting: *Deleted

## 2020-09-30 VITALS — BP 100/76 | HR 72 | Ht 69.29 in | Wt 154.1 lb

## 2020-09-30 DIAGNOSIS — Z7901 Long term (current) use of anticoagulants: Secondary | ICD-10-CM | POA: Diagnosis not present

## 2020-09-30 DIAGNOSIS — D126 Benign neoplasm of colon, unspecified: Secondary | ICD-10-CM | POA: Diagnosis not present

## 2020-09-30 MED ORDER — NA SULFATE-K SULFATE-MG SULF 17.5-3.13-1.6 GM/177ML PO SOLN: 1.0000 | Freq: Once | ORAL | 0 refills | Status: AC

## 2020-09-30 NOTE — Patient Instructions (Addendum)
You have been scheduled for a colonoscopy. Please follow written instructions given to you at your visit today.  Please pick up your prep supplies at the pharmacy within the next 1-3 days. If you use inhalers (even only as needed), please bring them with you on the day of your procedure.  You will be contacted by our office prior to your procedure for directions on holding your Eliquis.  If you do not hear from our office 1 week prior to your scheduled procedure, please call [phone] to discuss.    If you are age 63 or older, your body mass index should be between 23-30. Your Body mass index is 22.57 kg/m. If this is out of the aforementioned range listed, please consider follow up with your Primary Care Provider.  If you are age 22 or younger, your body mass index should be between 19-25. Your Body mass index is 22.57 kg/m. If this is out of the aformentioned range listed, please consider follow up with your Primary Care Provider.   __________________________________________________________  The [facility] providers would like to encourage you to use [facility] to communicate with providers for non-urgent requests or questions.  Due to long hold times on the telephone, sending your provider a message by [facility] may be a faster and more efficient way to get a response.  Please allow 48 business hours for a response.  Please remember that this is for non-urgent requests.

## 2020-09-30 NOTE — Telephone Encounter (Signed)
[facility] Pre-operative Risk Assessment     Request for surgical clearance:     Endoscopy Procedure  What type of surgery is being performed?     colonoscopy  When is this surgery scheduled?     Tuesday 12/27/20  What type of clearance is required ?   Pharmacy  Are there any medications that need to be held prior to surgery and how long? Eliquis 2 days  Practice name and name of physician performing surgery?      [facility]  What is your office phone and fax number?      Phone- [phone]  [phone]  Anesthesia type (None, local, MAC, general) ?       MAC

## 2020-09-30 NOTE — Progress Notes (Signed)
Agree with assessment and plan as outlined.  

## 2020-09-30 NOTE — Progress Notes (Signed)
09/30/2020 [name] [name] [id number] 1945-11-04   HISTORY OF PRESENT ILLNESS:  This is a pleasant 75 year old male who is a patient of Dr. Doyne Keel.  He is here today to discuss and schedule surveillance colonoscopy.  His last colonoscopy was in May 2019 that showed the following:  - One 3 mm polyp in the ascending colon, removed with a cold snare. Resected and retrieved. - Two 4 mm polyps in the transverse colon, removed with a cold snare. Resected and retrieved. - One 3 mm polyp in the rectum, removed with a cold snare. Resected and retrieved. - Diverticulosis in the entire examined colon. - Internal hemorrhoids. - The examination was otherwise normal.  Surgical [P], ascending and transverse-2, and rectal, polyp (4) - TUBULAR ADENOMA (3 OF 4 FRAGMENTS) - POLYPOID FRAGMENTS OF COLONIC MUCOSA WITH LYMPHOID AGGREGATE(S)(1 OF 4 FRAGMENTS) - NO HIGH GRADE DYSPLASIA OR MALIGNANCY IDENTIFIED  Repeat was recommended a 3-year interval.  He is now on Eliquis since about February for treatment of paroxysmal atrial fibrillation.  This is prescribed by his cardiologist, Dr. Gasper Sells.  He does not have any GI complaints.  He moves his bowels regularly.  No rectal bleeding.   Past Medical History:  Diagnosis Date   Adenomatous polyp of colon 11/13/2017   Anxiety    Depression    Diverticulosis of colon    History of atrial fibrillation 07/28/2012   History of atrial fibrillation without current medication    episode 2014 and 2015 in [place], [place] (copy of clinical summary scanned in epic from [place] [facility])     History of esophageal dilatation    History of esophageal stricture 04/01/2013   History of kidney stones    History of kidney stones    Hyperlipidemia    mild   Hyperlipidemia with target LDL less than 100 07/04/2011   Hypertension    Left ureteral stone    Obsessive compulsive disorder    OCD (obsessive compulsive disorder) 07/04/2011   PAF  (paroxysmal atrial fibrillation) ([place]) 05/26/2020   Prostate cancer ([place]) UROLOGIST-  DR DALHSTEDT   Gleason 3+4,  PSA 3.93, vol 25.2cc--  s/p  prostatectomy 12-24-2013   Past Surgical History:  Procedure Laterality Date   COLONOSCOPY  2009   Dr. Deatra Ina   HERNIA REPAIR  as child   LYMPHADENECTOMY Bilateral 12/24/2013   Procedure: LYMPHADENECTOMY;  Surgeon: Raynelle Bring, MD;  Location: [facility];  Service: Urology;  Laterality: Bilateral;   ROBOT ASSISTED LAPAROSCOPIC RADICAL PROSTATECTOMY N/A 12/24/2013   Procedure: ROBOTIC ASSISTED LAPAROSCOPIC RADICAL PROSTATECTOMY LEVEL 2;  Surgeon: Raynelle Bring, MD;  Location: [facility];  Service: Urology;  Laterality: N/A;   TONSILLECTOMY     WISDOM TOOTH EXTRACTION      reports that he quit smoking about 15 years ago. His smoking use included cigarettes. He has never used smokeless tobacco. He reports current alcohol use of about 14.0 standard drinks of alcohol per week. He reports that he does not use drugs. family history includes Bladder Cancer in his father. No Known Allergies    Outpatient Encounter Medications as of 09/30/2020  Medication Sig   apixaban (ELIQUIS) 5 MG TABS tablet Take 1 tablet (5 mg total) by mouth 2 (two) times daily.   clonazePAM (KLONOPIN) 0.5 MG tablet Take 1 tablet (0.5 mg total) by mouth 2 (two) times daily as needed.   diltiazem (CARDIZEM) 30 MG tablet TAKE 1 TABLET BY MOUTH FOUR TIMES DAILY AS NEEDED FOR HEART RATE GREATER  THAN 110   Fluvoxamine Maleate 100 MG CP24 Take 2 capsules (200 mg total) by mouth daily.   ibuprofen (ADVIL,MOTRIN) 200 MG tablet Take 400 mg by mouth every 6 (six) hours as needed for headache or moderate pain.   losartan-hydrochlorothiazide (HYZAAR) 50-12.5 MG tablet Take 1 tablet by mouth daily.   Multiple Vitamins-Minerals (MULTIVITAMIN WITH MINERALS) tablet Take 1 tablet by mouth daily.   Facility-Administered Encounter Medications as of 09/30/2020  Medication   0.9 %  sodium chloride infusion     REVIEW OF SYSTEMS  : All other systems reviewed and negative except where noted in the History of Present Illness.   PHYSICAL EXAM: BP 100/76 (BP Location: Left Arm, Patient Position: Sitting, Cuff Size: Normal)   Pulse 72   Ht 5' 9.29" (1.76 m) Comment: height measured without shoes  Wt 154 lb 2 oz (69.9 kg)   BMI 22.57 kg/m  General: Well developed white male in no acute distress Head: Normocephalic and atraumatic Eyes:  Sclerae anicteric, conjunctiva pink. Ears: Normal auditory acuity Lungs: Clear throughout to auscultation; no W/R/R. Heart: Regular rate and rhythm; no M/R/G. Abdomen: Soft, non-distended.  BS present.  Non-tender. Rectal:  Will be done at the time of colonoscopy. Musculoskeletal: Symmetrical with no gross deformities  Skin: No lesions on visible extremities Extremities: No edema  Neurological: Alert oriented x 4, grossly non-focal Psychological:  Alert and cooperative. Normal mood and affect  ASSESSMENT AND PLAN: *Personal history of colon polyps:  Last colonoscopy 08/2017 with adenomatous polyps removed.  Repeat recommended in 3 years.  Will schedule with Dr. Luvenia Starch. *Chronic anticoagulation with Eliquis due to history of atrial fibrillation:  Will hold Eliquis 2 days prior to endoscopic procedures - will instruct when and how to resume after procedure. Benefits and risks of procedure explained including risks of bleeding, perforation, infection, missed lesions, reactions to medications and possible need for hospitalization and surgery for complications. Additional rare but real risk of stroke or other vascular clotting events off of Eliquis also explained and need to seek urgent help if any signs of these problems occur. Will communicate by phone or EMR with patient's prescribing provider, Dr. Gasper Sells, to confirm that holding Eliquis is reasonable in this case.     CC:  Denita Lung, MD

## 2020-09-30 NOTE — Telephone Encounter (Signed)
Patient with diagnosis of atrial fibrillation on Eliquis for anticoagulation.    Procedure: colonoscopy Date of procedure: 12/27/20   CHA2DS2-VASc Score = 2  This indicates a 2.2% annual risk of stroke. The patient's score is based upon: CHF History: No HTN History: Yes Diabetes History: No Stroke History: No Vascular Disease History: No Age Score: 1 Gender Score: 0   CrCl 66 Platelet count 168  Per office protocol, patient can hold Eliquis for 2 days prior to procedure.   Patient will not need bridging with Lovenox (enoxaparin) around procedure.  Please note, procedure is still 3 months away.  Patient should contact our office if any health changes before procedure.

## 2020-11-14 NOTE — Telephone Encounter (Signed)
Left message for patient to return my call.

## 2020-11-14 NOTE — Telephone Encounter (Signed)
Informed patient he can hold Eliquis per Cardiology 2 days prior to his procedure. Patient verbalized understanding.

## 2020-11-14 NOTE — Telephone Encounter (Signed)
Patient returned call

## 2020-12-22 ENCOUNTER — Other Ambulatory Visit: Payer: Self-pay | Admitting: Family Medicine

## 2020-12-22 DIAGNOSIS — I10 Essential (primary) hypertension: Secondary | ICD-10-CM

## 2020-12-26 ENCOUNTER — Ambulatory Visit: Payer: Medicare PPO | Admitting: Family Medicine

## 2020-12-27 ENCOUNTER — Encounter: Payer: Self-pay | Admitting: Gastroenterology

## 2020-12-27 ENCOUNTER — Ambulatory Visit (AMBULATORY_SURGERY_CENTER): Payer: Medicare PPO | Admitting: Gastroenterology

## 2020-12-27 ENCOUNTER — Other Ambulatory Visit: Payer: Self-pay

## 2020-12-27 VITALS — BP 104/67 | HR 44 | Temp 97.3°F | Resp 10 | Ht 69.0 in | Wt 154.0 lb

## 2020-12-27 DIAGNOSIS — Z86010 Personal history of colonic polyps: Secondary | ICD-10-CM | POA: Diagnosis not present

## 2020-12-27 DIAGNOSIS — D124 Benign neoplasm of descending colon: Secondary | ICD-10-CM | POA: Diagnosis not present

## 2020-12-27 DIAGNOSIS — K635 Polyp of colon: Secondary | ICD-10-CM | POA: Diagnosis not present

## 2020-12-27 DIAGNOSIS — Z7901 Long term (current) use of anticoagulants: Secondary | ICD-10-CM

## 2020-12-27 MED ORDER — SODIUM CHLORIDE 0.9 % IV SOLN: 500.0000 mL | Freq: Once | INTRAVENOUS | Status: DC

## 2020-12-27 NOTE — Op Note (Signed)
[facility] Patient Name: [name] [name] Procedure Date: 12/27/2020 11:17 AM MRN: [id number] Endoscopist: Remo Lipps P. Havery Moros , MD Age: 75 Referring MD:  Date of Birth: 08/29/1945 Gender: Male Account #: [id number] Procedure:                Colonoscopy Indications:              High risk colon cancer surveillance: Personal                            history of colonic polyps - history of 4 polyps -                            most adenomas (08/2017) Medicines:                Monitored Anesthesia Care Procedure:                Pre-Anesthesia Assessment:                           - Prior to the procedure, a History and Physical                            was performed, and patient medications and                            allergies were reviewed. The patient's tolerance of                            previous anesthesia was also reviewed. The risks                            and benefits of the procedure and the sedation                            options and risks were discussed with the patient.                            All questions were answered, and informed consent                            was obtained. Prior Anticoagulants: The patient has                            taken Eliquis (apixaban), last dose was 2 days                            prior to procedure. ASA Grade Assessment: II - A                            patient with mild systemic disease. After reviewing                            the risks and benefits, the patient was deemed in  satisfactory condition to undergo the procedure.                           After obtaining informed consent, the colonoscope                            was passed under direct vision. Throughout the                            procedure, the patient's blood pressure, pulse, and                            oxygen saturations were monitored continuously. The                            Olympus [id number]-[id number]  [id number]) Colonoscope was                            introduced through the anus and advanced to the the                            cecum, identified by appendiceal orifice and                            ileocecal valve. The colonoscopy was performed                            without difficulty. The patient tolerated the                            procedure well. The quality of the bowel                            preparation was good. The ileocecal valve,                            appendiceal orifice, and rectum were photographed. Scope In: 11:24:32 AM Scope Out: 11:43:56 AM Scope Withdrawal Time: 0 hours 16 minutes 7 seconds  Total Procedure Duration: 0 hours 19 minutes 24 seconds  Findings:                 The perianal and digital rectal examinations were                            normal.                           A 2 to 3 mm polyp was found in the descending                            colon. The polyp was sessile. The polyp was removed                            with a cold snare. Resection and retrieval were  complete.                           Scattered diverticula were found in the entire                            colon.                           Internal hemorrhoids were found during                            retroflexion. The hemorrhoids were small.                           The exam was otherwise without abnormality. Complications:            No immediate complications. Estimated blood loss:                            Minimal. Estimated Blood Loss:     Estimated blood loss was minimal. Impression:               - One 2 to 3 mm polyp in the descending colon,                            removed with a cold snare. Resected and retrieved.                           - Diverticulosis in the entire examined colon.                           - Internal hemorrhoids.                           - The examination was otherwise normal. Recommendation:            - Patient has a contact number available for                            emergencies. The signs and symptoms of potential                            delayed complications were discussed with the                            patient. Return to normal activities tomorrow.                            Written discharge instructions were provided to the                            patient.                           - Resume previous diet.                           -  Continue present medications.                           - Resume Eliquis tomorrow                           - Await pathology results. Likely no further                            surveillance is needed due to age Carlota Raspberry. Aarian Cleaver, MD 12/27/2020 11:50:17 AM This report has been signed electronically.

## 2020-12-27 NOTE — Progress Notes (Signed)
[facility] History and Physical   Primary Care Physician:  Denita Lung, MD   Reason for Procedure:   History of colon polyps  Plan:    colonoscopy     HPI: [name] [name] is a 75 y.o. male  here for colonoscopy surveillance - 4 polyps removed 08/2017 - at least 3 adenomas. Patient denies any bowel symptoms at this time. No family history of colon cancer known. Otherwise feels well without any cardiopulmonary symptoms. Last took Eliquis 2 days ago.    Past Medical History:  Diagnosis Date   Adenomatous polyp of colon 11/13/2017   Anxiety    Depression    Diverticulosis of colon    History of atrial fibrillation 07/28/2012   History of atrial fibrillation without current medication    episode 2014 and 2015 in [place], [place] (copy of clinical summary scanned in epic from [facility])     History of esophageal dilatation    History of esophageal stricture 04/01/2013   History of kidney stones    History of kidney stones    Hyperlipidemia    mild   Hyperlipidemia with target LDL less than 100 07/04/2011   Hypertension    Left ureteral stone    Obsessive compulsive disorder    OCD (obsessive compulsive disorder) 07/04/2011   PAF (paroxysmal atrial fibrillation) ([place]) 05/26/2020   Prostate cancer ([place]) UROLOGIST-  DR DALHSTEDT   Gleason 3+4,  PSA 3.93, vol 25.2cc--  s/p  prostatectomy 12-24-2013    Past Surgical History:  Procedure Laterality Date   COLONOSCOPY  2009   Dr. Deatra Ina   HERNIA REPAIR  as child   LYMPHADENECTOMY Bilateral 12/24/2013   Procedure: LYMPHADENECTOMY;  Surgeon: Raynelle Bring, MD;  Location: [facility];  Service: Urology;  Laterality: Bilateral;   ROBOT ASSISTED LAPAROSCOPIC RADICAL PROSTATECTOMY N/A 12/24/2013   Procedure: ROBOTIC ASSISTED LAPAROSCOPIC RADICAL PROSTATECTOMY LEVEL 2;  Surgeon: Raynelle Bring, MD;  Location: [facility];  Service: Urology;  Laterality: N/A;   TONSILLECTOMY     WISDOM TOOTH EXTRACTION      Prior to  Admission medications   Medication Sig Start Date End Date Taking? Authorizing Provider  clonazePAM (KLONOPIN) 0.5 MG tablet Take 1 tablet (0.5 mg total) by mouth 2 (two) times daily as needed. 12/23/19  Yes Denita Lung, MD  Fluvoxamine Maleate 100 MG CP24 Take 2 capsules (200 mg total) by mouth daily. 02/17/20  Yes Denita Lung, MD  ibuprofen (ADVIL,MOTRIN) 200 MG tablet Take 400 mg by mouth every 6 (six) hours as needed for headache or moderate pain.   Yes [provider]  losartan-hydrochlorothiazide (HYZAAR) 50-12.5 MG tablet TAKE 1 TABLET BY MOUTH DAILY 12/22/20  Yes Denita Lung, MD  Multiple Vitamins-Minerals (MULTIVITAMIN WITH MINERALS) tablet Take 1 tablet by mouth daily.   Yes [provider]  apixaban (ELIQUIS) 5 MG TABS tablet Take 1 tablet (5 mg total) by mouth 2 (two) times daily. 05/26/20   Chandrasekhar, Mahesh A, MD  diltiazem (CARDIZEM) 30 MG tablet TAKE 1 TABLET BY MOUTH FOUR TIMES DAILY AS NEEDED FOR HEART RATE GREATER THAN 110 08/15/20   Chandrasekhar, Mahesh A, MD    Current Outpatient Medications  Medication Sig Dispense Refill   clonazePAM (KLONOPIN) 0.5 MG tablet Take 1 tablet (0.5 mg total) by mouth 2 (two) times daily as needed. 20 tablet 0   Fluvoxamine Maleate 100 MG CP24 Take 2 capsules (200 mg total) by mouth daily. 180 capsule 3   ibuprofen (ADVIL,MOTRIN) 200  MG tablet Take 400 mg by mouth every 6 (six) hours as needed for headache or moderate pain.     losartan-hydrochlorothiazide (HYZAAR) 50-12.5 MG tablet TAKE 1 TABLET BY MOUTH DAILY 90 tablet 1   Multiple Vitamins-Minerals (MULTIVITAMIN WITH MINERALS) tablet Take 1 tablet by mouth daily.     apixaban (ELIQUIS) 5 MG TABS tablet Take 1 tablet (5 mg total) by mouth 2 (two) times daily. 180 tablet 3   diltiazem (CARDIZEM) 30 MG tablet TAKE 1 TABLET BY MOUTH FOUR TIMES DAILY AS NEEDED FOR HEART RATE GREATER THAN 110 90 tablet 0   Current Facility-Administered Medications  Medication Dose Route  Frequency Provider Last Rate Last Admin   0.9 %  sodium chloride infusion  500 mL Intravenous Once Ronson Hagins, Carlota Raspberry, MD       0.9 %  sodium chloride infusion  500 mL Intravenous Once Kiyanna Biegler, Carlota Raspberry, MD        Allergies as of 12/27/2020   (No Known Allergies)    Family History  Problem Relation Age of Onset   Bladder Cancer Father    Colon cancer Neg Hx    Esophageal cancer Neg Hx    Stomach cancer Neg Hx    Colon polyps Neg Hx    Rectal cancer Neg Hx     Social History   Socioeconomic History   Marital status: Single    Spouse name: Not on file   Number of children: Not on file   Years of education: Not on file   Highest education level: Not on file  Occupational History   Not on file  Tobacco Use   Smoking status: Former    Types: Cigarettes    Quit date: 04/16/2005    Years since quitting: 15.7   Smokeless tobacco: Never  Vaping Use   Vaping Use: Never used  Substance and Sexual Activity   Alcohol use: Yes    Alcohol/week: 14.0 standard drinks    Types: 14 Glasses of wine per week    Comment: 2glasses of wine per night per pt   Drug use: No   Sexual activity: Not Currently  Other Topics Concern   Not on file  Social History Narrative   Not on file   Social Determinants of Health   Financial Resource Strain: Not on file  Food Insecurity: Not on file  Transportation Needs: Not on file  Physical Activity: Not on file  Stress: Not on file  Social Connections: Not on file  Intimate Partner Violence: Not on file    Review of Systems: All other review of systems negative except as mentioned in the HPI.  Physical Exam: Vital signs BP (!) 144/88   Pulse 65   Temp (!) 97.3 F (36.3 C) (Temporal)   Resp 15   Ht '5\' 9"'$  (1.753 m)   Wt 154 lb (69.9 kg)   SpO2 100%   BMI 22.74 kg/m   General:   Alert,  Well-developed, well-nourished, pleasant and cooperative in NAD Lungs:  Clear throughout to auscultation.   Heart:  Regular rate and  rhythm Abdomen:  Soft, nontender and nondistended.   Neuro/Psych:  Alert and cooperative. Normal mood and affect. A and O x 3  Jolly Mango, MD [facility] ;

## 2020-12-27 NOTE — Progress Notes (Signed)
A and O x3. Report to RN. Tolerated MAC anesthesia well. 

## 2020-12-27 NOTE — Patient Instructions (Signed)
Handout on polyps and diverticulosis given.  Resume Eliquis tomorrow.    YOU HAD AN ENDOSCOPIC PROCEDURE TODAY AT [facility]:   Refer to the procedure report that was given to you for any specific questions about what was found during the examination.  If the procedure report does not answer your questions, please call your gastroenterologist to clarify.  If you requested that your care partner not be given the details of your procedure findings, then the procedure report has been included in a sealed envelope for you to review at your convenience later.  YOU SHOULD EXPECT: Some feelings of bloating in the abdomen. Passage of more gas than usual.  Walking can help get rid of the air that was put into your GI tract during the procedure and reduce the bloating. If you had a lower endoscopy (such as a colonoscopy or flexible sigmoidoscopy) you may notice spotting of blood in your stool or on the toilet paper. If you underwent a bowel prep for your procedure, you may not have a normal bowel movement for a few days.  Please Note:  You might notice some irritation and congestion in your nose or some drainage.  This is from the oxygen used during your procedure.  There is no need for concern and it should clear up in a day or so.  SYMPTOMS TO REPORT IMMEDIATELY:  Following lower endoscopy (colonoscopy or flexible sigmoidoscopy):  Excessive amounts of blood in the stool  Significant tenderness or worsening of abdominal pains  Swelling of the abdomen that is new, acute  Fever of 100F or higher   For urgent or emergent issues, a gastroenterologist can be reached at any hour by calling [phone]. Do not use MyChart messaging for urgent concerns.    DIET:  We do recommend a small meal at first, but then you may proceed to your regular diet.  Drink plenty of fluids but you should avoid alcoholic beverages for 24 hours.  ACTIVITY:  You should plan to take it easy for the rest of  today and you should NOT DRIVE or use heavy machinery until tomorrow (because of the sedation medicines used during the test).    FOLLOW UP: Our staff will call the number listed on your records 48-72 hours following your procedure to check on you and address any questions or concerns that you may have regarding the information given to you following your procedure. If we do not reach you, we will leave a message.  We will attempt to reach you two times.  During this call, we will ask if you have developed any symptoms of COVID 19. If you develop any symptoms (ie: fever, flu-like symptoms, shortness of breath, cough etc.) before then, please call [phone].  If you test positive for Covid 19 in the 2 weeks post procedure, please call and report this information to [place].    If any biopsies were taken you will be contacted by phone or by letter within the next 1-3 weeks.  Please call us at [phone] if you have not heard about the biopsies in 3 weeks.    SIGNATURES/CONFIDENTIALITY: You and/or your care partner have signed paperwork which will be entered into your electronic medical record.  These signatures attest to the fact that that the information above on your After Visit Summary has been reviewed and is understood.  Full responsibility of the confidentiality of this discharge information lies with you and/or your care-partner.

## 2020-12-27 NOTE — Progress Notes (Signed)
Called to room to assist during endoscopic procedure.  Patient ID and intended procedure confirmed with present staff. Received instructions for my participation in the procedure from the performing physician.  

## 2020-12-27 NOTE — Progress Notes (Signed)
Pt's states no medical or surgical changes since previsit or office visit.  ° °Vitals CW °

## 2020-12-29 ENCOUNTER — Telehealth: Payer: Self-pay | Admitting: *Deleted

## 2020-12-29 ENCOUNTER — Telehealth: Payer: Self-pay

## 2020-12-29 NOTE — Telephone Encounter (Signed)
  Follow up Call-  Call back number 12/27/2020  Post procedure Call Back phone  # [phone]  Permission to leave phone message Yes  Some recent data might be hidden     Patient questions:  Do you have a fever, pain , or abdominal swelling? No. Pain Score  0 *  Have you tolerated food without any problems? Yes.    Have you been able to return to your normal activities? Yes.    Do you have any questions about your discharge instructions: Diet   No. Medications  No. Follow up visit  No.  Do you have questions or concerns about your Care? No.  Actions: * If pain score is 4 or above: No action needed, pain <4.  Have you developed a fever since your procedure? no  2.   Have you had an respiratory symptoms (SOB or cough) since your procedure? no  3.   Have you tested positive for COVID 19 since your procedure no  4.   Have you had any family members/close contacts diagnosed with the COVID 19 since your procedure?  no   If yes to any of these questions please route to Joylene Leviticus, RN and Joella Prince, RN

## 2020-12-29 NOTE — Telephone Encounter (Signed)
Called [phone] and left a message we tried to reach pt for a follow up call. maw

## 2021-01-09 NOTE — Progress Notes (Signed)
Cardiology Office Note:    Date:  01/10/2021   ID:  [name] [name], DOB January 06, 1946, MRN [id number]  PCP:  Denita Lung, MD  [facility] Cardiologist:  Werner Lean, MD  [place] Electrophysiologist:  None   CC: AF follow up  History of Present Illness:    [name] [name] is a 75 y.o. male with a hx of PAF, HTN, HLD who presents for evaluation 05/26/20.  In interim of this visit, patient echo and heart monitor with runs of AF.  Had CAC scan in interim showing AV Calcium.  Seen 01/10/21.  Patient notes that he is doing well- retired and doing well.  Since last visit has decreased his alcohol consumption and has improved AF control.  There are no interval hospital/ED visit.    No chest pain or pressure .  No SOB/DOE and no PND/Orthopnea.  No weight gain or leg swelling.  No palpitations or near syncope since May (AF spell).  No coughing up blood, blood in urine or stool.   Ambulatory blood pressure 120/60s.  Past Medical History:  Diagnosis Date   Adenomatous polyp of colon 11/13/2017   Anxiety    Depression    Diverticulosis of colon    History of atrial fibrillation 07/28/2012   History of atrial fibrillation without current medication    episode 2014 and 2015 in [place], [place] (copy of clinical summary scanned in epic from [place] [facility])     History of esophageal dilatation    History of esophageal stricture 04/01/2013   History of kidney stones    History of kidney stones    Hyperlipidemia    mild   Hyperlipidemia with target LDL less than 100 07/04/2011   Hypertension    Left ureteral stone    Obsessive compulsive disorder    OCD (obsessive compulsive disorder) 07/04/2011   PAF (paroxysmal atrial fibrillation) ([place]) 05/26/2020   Prostate cancer ([place]) UROLOGIST-  DR DALHSTEDT   Gleason 3+4,  PSA 3.93, vol 25.2cc--  s/p  prostatectomy 12-24-2013    Past Surgical History:  Procedure Laterality Date   COLONOSCOPY  2009   Dr. Deatra Ina   HERNIA  REPAIR  as child   LYMPHADENECTOMY Bilateral 12/24/2013   Procedure: LYMPHADENECTOMY;  Surgeon: Raynelle Bring, MD;  Location: [facility];  Service: Urology;  Laterality: Bilateral;   ROBOT ASSISTED LAPAROSCOPIC RADICAL PROSTATECTOMY N/A 12/24/2013   Procedure: ROBOTIC ASSISTED LAPAROSCOPIC RADICAL PROSTATECTOMY LEVEL 2;  Surgeon: Raynelle Bring, MD;  Location: [facility];  Service: Urology;  Laterality: N/A;   TONSILLECTOMY     WISDOM TOOTH EXTRACTION      Current Medications: Current Meds  Medication Sig   apixaban (ELIQUIS) 5 MG TABS tablet Take 1 tablet (5 mg total) by mouth 2 (two) times daily.   clonazePAM (KLONOPIN) 0.5 MG tablet Take 1 tablet (0.5 mg total) by mouth 2 (two) times daily as needed.   diltiazem (CARDIZEM) 30 MG tablet TAKE 1 TABLET BY MOUTH FOUR TIMES DAILY AS NEEDED FOR HEART RATE GREATER THAN 110   Fluvoxamine Maleate 100 MG CP24 Take 2 capsules (200 mg total) by mouth daily.   ibuprofen (ADVIL,MOTRIN) 200 MG tablet Take 400 mg by mouth every 6 (six) hours as needed for headache or moderate pain.   losartan-hydrochlorothiazide (HYZAAR) 50-12.5 MG tablet TAKE 1 TABLET BY MOUTH DAILY   Multiple Vitamins-Minerals (MULTIVITAMIN WITH MINERALS) tablet Take 1 tablet by mouth daily.     Allergies:   Patient has no known allergies.  Social History   Socioeconomic History   Marital status: Single    Spouse name: Not on file   Number of children: Not on file   Years of education: Not on file   Highest education level: Not on file  Occupational History   Not on file  Tobacco Use   Smoking status: Former    Types: Cigarettes    Quit date: 04/16/2005    Years since quitting: 15.7   Smokeless tobacco: Never  Vaping Use   Vaping Use: Never used  Substance and Sexual Activity   Alcohol use: Yes    Alcohol/week: 14.0 standard drinks    Types: 14 Glasses of wine per week    Comment: 2glasses of wine per night per pt   Drug use: No   Sexual activity: Not Currently  Other Topics  Concern   Not on file  Social History Narrative   Not on file   Social Determinants of Health   Financial Resource Strain: Not on file  Food Insecurity: Not on file  Transportation Needs: Not on file  Physical Activity: Not on file  Stress: Not on file  Social Connections: Not on file    Social: Jacqulyn Liner and [place] visits in the past; loves to travel  Family History: The patient's family history includes Bladder Cancer in his father. There is no history of Colon cancer, Esophageal cancer, Stomach cancer, Colon polyps, or Rectal cancer. History of coronary artery disease notable for no members. History of heart failure notable for mother. History of arrhythmia notable for no members.  ROS:   Please see the history of present illness.     All other systems reviewed and are negative.  EKGs/Labs/Other Studies Reviewed:    The following studies were reviewed today:  EKG:   01/10/21: Sinus bradycardia reate 57 1st HB 05/24/20 Sinus Tachycardia rate 118 Right Atrial Enlargement  Cardiac Event Monitoring: Date: 08/01/20 Results: Patient had a minimum heart rate of 37 bpm, maximum heart rate of 135 bpm, and average heart rate of 57 bpm. Predominant underlying rhythm was sinus rhythm. One run of atrial fibrillation occurred lasting 51 seconds with a max rate of 121 bpm at fastest. Isolated PACs were rare (<1.0%). Isolated PVCs were rare (<1.0%). No evidence of complete heart block. Triggered and diary events associated with sinus rhythm.   Asymptomatic, short run of atrial fibrillation.    Transthoracic Echocardiogram: Date: 07/11/20 Results: 1. Left ventricular ejection fraction, by estimation, is 65 to 70%. The  left ventricle has normal function. The left ventricle has no regional  wall motion abnormalities. Left ventricular diastolic parameters were  normal.   2. Right ventricular systolic function is normal. The right ventricular  size is normal.   3. The mitral valve is  normal in structure. Trivial mitral valve  regurgitation.   4. The aortic valve is tricuspid. Aortic valve regurgitation is not  visualized. Mild to moderate aortic valve sclerosis/calcification is  present, without any evidence of aortic stenosis.   5. The inferior vena cava is normal in size with greater than 50%  respiratory variability, suggesting right atrial pressure of 3 mmHg.   Cardiac CT: Date: 09/08/20 Results: Aortic valve calcification and aortic atherosclerosis   Recent Labs: 05/24/2020: BUN 14; Creatinine, Ser 1.00; Hemoglobin 16.4; Platelets 168; Potassium 3.6; Sodium 138  Recent Lipid Panel    Component Value Date/Time   CHOL 261 (H) 12/23/2019 1510   TRIG 105 12/23/2019 1510   HDL 84 12/23/2019 1510   CHOLHDL  3.1 12/23/2019 1510   CHOLHDL 2.9 11/08/2016 1551   VLDL 23 11/08/2016 1551   LDLCALC 159 (H) 12/23/2019 1510     Risk Assessment/Calculations:    CHA2DS2-VASc Score = 2  This indicates a 2.2% annual risk of stroke. The patient's score is based upon: CHF History: 0 HTN History: 1 Diabetes History: 0 Stroke History: 0 Vascular Disease History: 0 Age Score: 1 Gender Score: 0     The 10-year ASCVD risk score (Arnett DK, et al., 2019) is: 20.4%   Values used to calculate the score:     Age: 66 years     Sex: Male     Is Non-Hispanic African American: No     Diabetic: No     Tobacco smoker: No     Systolic Blood Pressure: 580 mmHg     Is BP treated: Yes     HDL Cholesterol: 84 mg/dL     Total Cholesterol: 261 mg/dL   Physical Exam:    VS:  BP 118/60   Pulse (!) 57   Ht 5\' 9"  (1.753 m)   Wt 155 lb (70.3 kg)   SpO2 98%   BMI 22.89 kg/m     Wt Readings from Last 3 Encounters:  01/10/21 155 lb (70.3 kg)  12/27/20 154 lb (69.9 kg)  09/30/20 154 lb 2 oz (69.9 kg)    GEN:  Well nourished, well developed in no acute distress HEENT: Normal NECK: No JVD LYMPHATICS: No lymphadenopathy CARDIAC: RRR, no murmurs, rubs, gallops RESPIRATORY:   Clear to auscultation without rales, wheezing or rhonchi  ABDOMEN: Soft, non-tender, non-distended MUSCULOSKELETAL:  No edema; No deformity  SKIN: Warm and dry NEUROLOGIC:  Alert and oriented x 3; notable right eye tick PSYCHIATRIC:  Normal affect   ASSESSMENT:    1. Aortic atherosclerosis ([place])   2. PAF (paroxysmal atrial fibrillation) ([place])   3. Essential hypertension     PLAN:    In order of problems listed above:  HLD Aortic Atherosclerosis AV Calcification - LDL < 70, lipids today based on this will discuss statin  -  in one year may repeat echo based on symptoms  - reviewed CAC CT with patient  Paroxysmal Atrial Fibrillation - Risk factors include HTN - CHADSVASC=2. - discussed alcohol again (with decrease has less burden)- eliquis 5 mg BID; CBC - Continue rate control with PRN diltiazem 30 mg    Essential Hypertension - ambulatory blood pressure controlled, will continue ambulatory BP monitoring; - continue home medications  Will plan for one year follow up unless new symptoms or abnormal test results warranting change in plan  Would be reasonable for  APP Follow up      Medication Adjustments/Labs and Tests Ordered: Current medicines are reviewed at length with the patient today.  Concerns regarding medicines are outlined above.  Orders Placed This Encounter  Procedures   Lipid panel   EKG 12-Lead    No orders of the defined types were placed in this encounter.   Patient Instructions  Medication Instructions:  Your physician recommends that you continue on your current medications as directed. Please refer to the Current Medication list given to you today.  *If you need a refill on your cardiac medications before your next appointment, please call your pharmacy*   Lab Work: TODAY: Lipid panel If you have labs (blood work) drawn today and your tests are completely normal, you will receive your results only by: [place] (if you have MyChart)  OR A paper  copy in the mail If you have any lab test that is abnormal or we need to change your treatment, we will call you to review the results.   Testing/Procedures: NONE   Follow-Up: At [facility], you and your health needs are our priority.  As part of our continuing mission to provide you with exceptional heart care, we have created designated Provider Care Teams.  These Care Teams include your primary Cardiologist (physician) and Advanced Practice Providers (APPs -  Physician Assistants and Nurse Practitioners) who all work together to provide you with the care you need, when you need it.  We recommend signing up for the patient portal called "MyChart".  Sign up information is provided on this After Visit Summary.  MyChart is used to connect with patients for Virtual Visits (Telemedicine).  Patients are able to view lab/test results, encounter notes, upcoming appointments, etc.  Non-urgent messages can be sent to your provider as well.   To learn more about what you can do with MyChart, go to [email or web address].    Your next appointment:   1 year(s)  The format for your next appointment:   In Person  Provider:   You may see Werner Lean, MD or one of the following Advanced Practice Providers on your designated Care Team:   Melina Copa, PA-C Ermalinda Barrios, PA-C        Signed, Werner Lean, MD  01/10/2021 10:25 AM    [place]

## 2021-01-10 ENCOUNTER — Other Ambulatory Visit: Payer: Self-pay

## 2021-01-10 ENCOUNTER — Encounter: Payer: Self-pay | Admitting: Internal Medicine

## 2021-01-10 ENCOUNTER — Ambulatory Visit: Payer: Medicare PPO | Admitting: Internal Medicine

## 2021-01-10 VITALS — BP 118/60 | HR 57 | Ht 69.0 in | Wt 155.0 lb

## 2021-01-10 DIAGNOSIS — I480 Paroxysmal atrial fibrillation: Secondary | ICD-10-CM | POA: Diagnosis not present

## 2021-01-10 DIAGNOSIS — I10 Essential (primary) hypertension: Secondary | ICD-10-CM

## 2021-01-10 DIAGNOSIS — I700 Atherosclerosis of aorta: Secondary | ICD-10-CM | POA: Diagnosis not present

## 2021-01-10 LAB — LIPID PANEL
Chol/HDL Ratio: 3.2 ratio (ref 0.0–5.0)
Cholesterol, Total: 215 mg/dL — ABNORMAL HIGH (ref 100–199)
HDL: 68 mg/dL (ref >39)
LDL Chol Calc (NIH): 132 mg/dL — ABNORMAL HIGH (ref 0–99)
Triglycerides: 85 mg/dL (ref 0–149)
VLDL Cholesterol Cal: 15 mg/dL (ref 5–40)

## 2021-01-10 NOTE — Patient Instructions (Signed)
Medication Instructions:  Your physician recommends that you continue on your current medications as directed. Please refer to the Current Medication list given to you today.  *If you need a refill on your cardiac medications before your next appointment, please call your pharmacy*   Lab Work: TODAY: Lipid panel If you have labs (blood work) drawn today and your tests are completely normal, you will receive your results only by: [place] (if you have MyChart) OR A paper copy in the mail If you have any lab test that is abnormal or we need to change your treatment, we will call you to review the results.   Testing/Procedures: NONE   Follow-Up: At [facility], you and your health needs are our priority.  As part of our continuing mission to provide you with exceptional heart care, we have created designated Provider Care Teams.  These Care Teams include your primary Cardiologist (physician) and Advanced Practice Providers (APPs -  Physician Assistants and Nurse Practitioners) who all work together to provide you with the care you need, when you need it.  We recommend signing up for the patient portal called "MyChart".  Sign up information is provided on this After Visit Summary.  MyChart is used to connect with patients for Virtual Visits (Telemedicine).  Patients are able to view lab/test results, encounter notes, upcoming appointments, etc.  Non-urgent messages can be sent to your provider as well.   To learn more about what you can do with MyChart, go to [email or web address].    Your next appointment:   1 year(s)  The format for your next appointment:   In Person  Provider:   You may see Werner Lean, MD or one of the following Advanced Practice Providers on your designated Care Team:   Melina Copa, PA-C Ermalinda Barrios, PA-C

## 2021-01-17 ENCOUNTER — Telehealth: Payer: Self-pay

## 2021-01-17 DIAGNOSIS — I700 Atherosclerosis of aorta: Secondary | ICD-10-CM

## 2021-01-17 DIAGNOSIS — E785 Hyperlipidemia, unspecified: Secondary | ICD-10-CM

## 2021-01-17 MED ORDER — ROSUVASTATIN CALCIUM 10 MG PO TABS: 10.0000 mg | ORAL_TABLET | Freq: Every day | ORAL | 3 refills | Status: DC

## 2021-01-17 NOTE — Telephone Encounter (Signed)
-----   Message from Werner Lean, MD sent at 01/13/2021  3:02 PM EDT ----- Results: LDL above goal Plan: Rosuvastatin 10 mg PO Daily and recheck in 3 months  Werner Lean, MD

## 2021-01-17 NOTE — Telephone Encounter (Signed)
Reviewed results and MD recommendation with pt.  He is agreeable to plan of care orders placed.

## 2021-01-24 ENCOUNTER — Other Ambulatory Visit: Payer: Self-pay | Admitting: Family Medicine

## 2021-01-24 DIAGNOSIS — F429 Obsessive-compulsive disorder, unspecified: Secondary | ICD-10-CM

## 2021-01-24 NOTE — Telephone Encounter (Signed)
[facility] is requesting to fill pt fluvoxamine. Please advise [facility]

## 2021-04-24 ENCOUNTER — Other Ambulatory Visit: Payer: Self-pay | Admitting: Internal Medicine

## 2021-04-24 ENCOUNTER — Other Ambulatory Visit: Payer: Medicare PPO | Admitting: *Deleted

## 2021-04-24 ENCOUNTER — Other Ambulatory Visit: Payer: Self-pay | Admitting: Family Medicine

## 2021-04-24 ENCOUNTER — Other Ambulatory Visit: Payer: Self-pay

## 2021-04-24 DIAGNOSIS — I700 Atherosclerosis of aorta: Secondary | ICD-10-CM | POA: Diagnosis not present

## 2021-04-24 DIAGNOSIS — I10 Essential (primary) hypertension: Secondary | ICD-10-CM

## 2021-04-24 DIAGNOSIS — E785 Hyperlipidemia, unspecified: Secondary | ICD-10-CM | POA: Diagnosis not present

## 2021-04-24 DIAGNOSIS — I480 Paroxysmal atrial fibrillation: Secondary | ICD-10-CM

## 2021-04-24 LAB — ALT: ALT: 23 IU/L (ref 0–44)

## 2021-04-24 LAB — LIPID PANEL
Chol/HDL Ratio: 1.9 ratio (ref 0.0–5.0)
Cholesterol, Total: 156 mg/dL (ref 100–199)
HDL: 83 mg/dL (ref >39)
LDL Chol Calc (NIH): 56 mg/dL (ref 0–99)
Triglycerides: 91 mg/dL (ref 0–149)
VLDL Cholesterol Cal: 17 mg/dL (ref 5–40)

## 2021-04-24 NOTE — Telephone Encounter (Signed)
Eliquis 5mg  refill request received. Patient is 76 years old, weight-70.3kg, Crea-1.00 on 05/24/2020, Diagnosis-Afib, and last seen by Dr. Gasper Sells on 01/10/2021. Dose is appropriate based on dosing criteria. Will send in refill to requested pharmacy.

## 2021-05-03 ENCOUNTER — Other Ambulatory Visit: Payer: Self-pay

## 2021-05-03 ENCOUNTER — Ambulatory Visit: Payer: Medicare PPO | Admitting: Family Medicine

## 2021-05-03 ENCOUNTER — Encounter: Payer: Self-pay | Admitting: Family Medicine

## 2021-05-03 VITALS — BP 110/70 | HR 66 | Temp 96.3°F | Ht 69.25 in | Wt 155.6 lb

## 2021-05-03 DIAGNOSIS — I700 Atherosclerosis of aorta: Secondary | ICD-10-CM

## 2021-05-03 DIAGNOSIS — F419 Anxiety disorder, unspecified: Secondary | ICD-10-CM

## 2021-05-03 DIAGNOSIS — F429 Obsessive-compulsive disorder, unspecified: Secondary | ICD-10-CM | POA: Diagnosis not present

## 2021-05-03 DIAGNOSIS — J301 Allergic rhinitis due to pollen: Secondary | ICD-10-CM

## 2021-05-03 DIAGNOSIS — Z0000 Encounter for general adult medical examination without abnormal findings: Secondary | ICD-10-CM | POA: Diagnosis not present

## 2021-05-03 DIAGNOSIS — Z8546 Personal history of malignant neoplasm of prostate: Secondary | ICD-10-CM

## 2021-05-03 DIAGNOSIS — Z86010 Personal history of colonic polyps: Secondary | ICD-10-CM | POA: Diagnosis not present

## 2021-05-03 DIAGNOSIS — L989 Disorder of the skin and subcutaneous tissue, unspecified: Secondary | ICD-10-CM

## 2021-05-03 DIAGNOSIS — Z7901 Long term (current) use of anticoagulants: Secondary | ICD-10-CM | POA: Diagnosis not present

## 2021-05-03 DIAGNOSIS — Z87442 Personal history of urinary calculi: Secondary | ICD-10-CM | POA: Diagnosis not present

## 2021-05-03 NOTE — Progress Notes (Signed)
[name] [name] is a 76 y.o. male who presents for annual wellness visit ,CPE and follow-up on chronic medical conditions.  He has had difficulty with PAF and has seen cardiology for this.  He also has a history of aortic atherosclerosis.  He is now on a statin drug and also taking Eliquis, Cardizem and also on Crestor..  He seems to be doing well on both of these medications.  He does have a lesion on the right cheek that he would like evaluated.  It apparently has been there for several years and has not changed.  He continues on Luvox and occasional use of Klonopin.  He does have a previous history of prostate cancer and is now getting yearly PSA.  He continues on Hyzaar and having no difficulty with that he does have history of colonic polyps but has had a colonoscopy and was essentially told he does not need any more colonoscopies.  He has had no difficulty with renal stones recently.   Immunizations and Health Maintenance Immunization History  Administered Date(s) Administered   Fluad Quad(high Dose 65+) 12/05/2018, 12/23/2019   Influenza Split 02/11/2009   Influenza, High Dose Seasonal PF 12/10/2016, 03/22/2018   Influenza,inj,Quad PF,6+ Mos 12/25/2013   Influenza-Unspecified 01/15/2021   PFIZER Comirnaty(Gray Top)Covid-19 Tri-Sucrose Vaccine 07/24/2020   PFIZER(Purple Top)SARS-COV-2 Vaccination 06/13/2019, 07/03/2019, 01/18/2020   Pneumococcal Conjugate-13 09/27/2015   Pneumococcal Polysaccharide-23 05/06/2008   Tdap 05/06/2008   Zoster Recombinat (Shingrix) 03/22/2018, 06/09/2018   Zoster, Live 05/06/2008   Health Maintenance Due  Topic Date Due   TETANUS/TDAP  05/06/2018   COVID-19 Vaccine (5 - Booster for Pfizer series) 09/18/2020    Last colonoscopy: 12/27/2020 Dr. Havery [name] GI Last PSA: 12/23/19 ( <0.1 ) Dentist: Q six months  Ophtho: Q year Exercise: walking five days a week  Other doctors caring for patient include: Dr. Teena [name] GI            Dr. Gasper [name]   Cardiology            Dr. Diona [name] urology   Advanced Directives: Does Patient Have a Medical Advance Directive?: Yes Type of Advance Directive: [place] will Does patient want to make changes to medical advance directive?: No - Patient declined Copy of [place] in Chart?: No - copy requested  Depression screen:  See questionnaire below.     Depression screen [facility] 2/9 05/03/2021 12/23/2019 12/05/2018 11/13/2017 11/08/2016  Decreased Interest 0 0 0 0 0  Down, Depressed, Hopeless 0 0 0 0 0  PHQ - 2 Score 0 0 0 0 0    Fall Screen: See Questionaire below.   Fall Risk  05/03/2021 12/23/2019 12/05/2018 11/13/2017 11/08/2016  Falls in the past year? 1 0 0 No No  Number falls in past yr: 0 - - - -  Injury with Fall? 0 - - - -  Risk for fall due to : No Fall Risks - - - -  Follow up Falls evaluation completed - - - -    ADL screen:  See questionnaire below.  Functional Status Survey: Is the patient deaf or have difficulty hearing?: No Does the patient have difficulty seeing, even when wearing glasses/contacts?: No Does the patient have difficulty concentrating, remembering, or making decisions?: No Does the patient have difficulty walking or climbing stairs?: No Does the patient have difficulty dressing or bathing?: No Does the patient have difficulty doing errands alone such as visiting a doctor's office or shopping?: No   Review  of Systems  Constitutional: -, -unexpected weight change, -anorexia, -fatigue Allergy: -sneezing, -itching, -congestion Dermatology: denies changing moles, rash, lumps ENT: -runny nose, -ear pain, -sore throat,  Cardiology:  -chest pain, -palpitations, -orthopnea, Respiratory: -cough, -shortness of breath, -dyspnea on exertion, -wheezing,  Gastroenterology: -abdominal pain, -nausea, -vomiting, -diarrhea, -constipation, -dysphagia Hematology: -bleeding or bruising problems Musculoskeletal: -arthralgias, -myalgias,  -joint swelling, -back pain, - Ophthalmology: -vision changes,  Urology: -dysuria, -difficulty urinating,  -urinary frequency, -urgency, incontinence Neurology: -, -numbness, , -memory loss, -falls, -dizziness    PHYSICAL EXAM: General Appearance: Alert, cooperative, no distress, appears stated age Head: Normocephalic, without obvious abnormality, atraumatic Eyes: PERRL, conjunctiva/corneas clear, EOM's intact, Ears: Normal TM's and external ear canals Nose: Nares normal, mucosa normal, no drainage or sinus   tenderness Throat: Lips, mucosa, and tongue normal; teeth and gums normal Neck: Supple, no lymphadenopathy, thyroid:no enlargement/tenderness/nodules; no carotid bruit or JVD Lungs: Clear to auscultation bilaterally without wheezes, rales or ronchi; respirations unlabored Heart: Regular rate and rhythm, S1 and S2 normal, no murmur, rub or gallop Abdomen: Soft, non-tender, nondistended, normoactive bowel sounds, no masses, no hepatosplenomegaly Skin: Skin color, texture, turgor normal, a round 1 cm lesion with slight discoloration centrally is noted  on the right cheek.  Lymph nodes: Cervical, supraclavicular, and axillary nodes normal Neurologic: CNII-XII intact, normal strength, sensation and gait; reflexes 2+ and symmetric throughout   Psych: Normal mood, affect, hygiene and grooming  ASSESSMENT/PLAN: Routine general medical examination at a health care facility - Plan: CBC with Differential/Platelet, Comprehensive metabolic panel, Lipid panel  Seasonal allergic rhinitis due to pollen  History of colonic polyps  History of renal stone  Obsessive-compulsive disorder, unspecified type  History of prostate cancer - Plan: PSA  Anxiety  Aortic atherosclerosis ([place]) - Plan: Lipid panel  Chronic anticoagulation  Skin lesion He will continue on his present medication regimen as well as follow-up with cardiology.  He will make an appointment for dermatology to have the cheek  lesion evaluated.   Discussed  at least 30 minutes of aerobic activity at least 5 days/week; Immunization recommendations discussed.  Recommend get Tdap at the pharmacy.  Colonoscopy recommendations reviewed.   Medicare Attestation I have personally reviewed: The patient's medical and social history Their use of alcohol, tobacco or illicit drugs Their current medications and supplements The patient's functional ability including ADLs,fall risks, home safety risks, cognitive, and hearing and visual impairment Diet and physical activities Evidence for depression or mood disorders  The patient's weight, height, and BMI have been recorded in the chart.  I have made referrals, counseling, and provided education to the patient based on review of the above and I have provided the patient with a written personalized care plan for preventive services.     [name], MD   05/03/2021

## 2021-05-04 LAB — CBC WITH DIFFERENTIAL/PLATELET
Basophils Absolute: 0 10*3/uL (ref 0.0–0.2)
Basos: 1 %
EOS (ABSOLUTE): 0.2 10*3/uL (ref 0.0–0.4)
Eos: 3 %
Hematocrit: 46.4 % (ref 37.5–51.0)
Hemoglobin: 15.8 g/dL (ref 13.0–17.7)
Immature Grans (Abs): 0 10*3/uL (ref 0.0–0.1)
Immature Granulocytes: 0 %
Lymphocytes Absolute: 1.7 10*3/uL (ref 0.7–3.1)
Lymphs: 32 %
MCH: 31.4 pg (ref 26.6–33.0)
MCHC: 34.1 g/dL (ref 31.5–35.7)
MCV: 92 fL (ref 79–97)
Monocytes Absolute: 0.5 10*3/uL (ref 0.1–0.9)
Monocytes: 10 %
Neutrophils Absolute: 2.8 10*3/uL (ref 1.4–7.0)
Neutrophils: 54 %
Platelets: 176 10*3/uL (ref 150–450)
RBC: 5.03 x10E6/uL (ref 4.14–5.80)
RDW: 13.1 % (ref 11.6–15.4)
WBC: 5.2 10*3/uL (ref 3.4–10.8)

## 2021-05-04 LAB — COMPREHENSIVE METABOLIC PANEL
ALT: 21 IU/L (ref 0–44)
AST: 24 IU/L (ref 0–40)
Albumin/Globulin Ratio: 3.1 — ABNORMAL HIGH (ref 1.2–2.2)
Albumin: 4.9 g/dL — ABNORMAL HIGH (ref 3.7–4.7)
Alkaline Phosphatase: 83 IU/L (ref 44–121)
BUN/Creatinine Ratio: 18 (ref 10–24)
BUN: 21 mg/dL (ref 8–27)
Bilirubin Total: 0.5 mg/dL (ref 0.0–1.2)
CO2: 25 mmol/L (ref 20–29)
Calcium: 10 mg/dL (ref 8.6–10.2)
Chloride: 105 mmol/L (ref 96–106)
Creatinine, Ser: 1.14 mg/dL (ref 0.76–1.27)
Globulin, Total: 1.6 g/dL (ref 1.5–4.5)
Glucose: 89 mg/dL (ref 70–99)
Potassium: 4.3 mmol/L (ref 3.5–5.2)
Sodium: 145 mmol/L — ABNORMAL HIGH (ref 134–144)
Total Protein: 6.5 g/dL (ref 6.0–8.5)
eGFR: 68 mL/min/{1.73_m2} (ref >59)

## 2021-05-04 LAB — LIPID PANEL
Chol/HDL Ratio: 2 ratio (ref 0.0–5.0)
Cholesterol, Total: 172 mg/dL (ref 100–199)
HDL: 85 mg/dL (ref >39)
LDL Chol Calc (NIH): 68 mg/dL (ref 0–99)
Triglycerides: 111 mg/dL (ref 0–149)
VLDL Cholesterol Cal: 19 mg/dL (ref 5–40)

## 2021-05-04 LAB — PSA: Prostate Specific Ag, Serum: <0.1 ng/mL (ref 0.0–4.0)

## 2021-05-10 DIAGNOSIS — H31092 Other chorioretinal scars, left eye: Secondary | ICD-10-CM | POA: Diagnosis not present

## 2021-05-10 DIAGNOSIS — H35363 Drusen (degenerative) of macula, bilateral: Secondary | ICD-10-CM | POA: Diagnosis not present

## 2021-05-10 DIAGNOSIS — H353131 Nonexudative age-related macular degeneration, bilateral, early dry stage: Secondary | ICD-10-CM | POA: Diagnosis not present

## 2021-05-10 DIAGNOSIS — H35372 Puckering of macula, left eye: Secondary | ICD-10-CM | POA: Diagnosis not present

## 2021-05-10 DIAGNOSIS — H35453 Secondary pigmentary degeneration, bilateral: Secondary | ICD-10-CM | POA: Diagnosis not present

## 2021-05-31 DIAGNOSIS — L821 Other seborrheic keratosis: Secondary | ICD-10-CM | POA: Diagnosis not present

## 2021-08-31 DIAGNOSIS — H1789 Other corneal scars and opacities: Secondary | ICD-10-CM | POA: Diagnosis not present

## 2021-08-31 DIAGNOSIS — H40033 Anatomical narrow angle, bilateral: Secondary | ICD-10-CM | POA: Diagnosis not present

## 2021-08-31 DIAGNOSIS — G43909 Migraine, unspecified, not intractable, without status migrainosus: Secondary | ICD-10-CM | POA: Diagnosis not present

## 2021-08-31 DIAGNOSIS — H31002 Unspecified chorioretinal scars, left eye: Secondary | ICD-10-CM | POA: Diagnosis not present

## 2021-10-21 ENCOUNTER — Other Ambulatory Visit: Payer: Self-pay | Admitting: Internal Medicine

## 2021-10-21 ENCOUNTER — Other Ambulatory Visit: Payer: Self-pay | Admitting: Family Medicine

## 2021-10-21 DIAGNOSIS — I10 Essential (primary) hypertension: Secondary | ICD-10-CM

## 2021-10-21 DIAGNOSIS — I480 Paroxysmal atrial fibrillation: Secondary | ICD-10-CM

## 2021-10-23 NOTE — Telephone Encounter (Signed)
Prescription refill request for Eliquis received. Indication: PAF Last office visit: 01/10/21  Osborne Oman MD Scr: 1.14 on 05/03/21 Age: 76 Weight: 70.3kg  Based on above findings Eliquis '5mg'$  twice daily is the appropriate dose.  Refill approved.

## 2021-10-30 DIAGNOSIS — H43812 Vitreous degeneration, left eye: Secondary | ICD-10-CM | POA: Diagnosis not present

## 2021-12-13 DIAGNOSIS — H43812 Vitreous degeneration, left eye: Secondary | ICD-10-CM | POA: Diagnosis not present

## 2021-12-13 DIAGNOSIS — H31092 Other chorioretinal scars, left eye: Secondary | ICD-10-CM | POA: Diagnosis not present

## 2021-12-13 DIAGNOSIS — H25813 Combined forms of age-related cataract, bilateral: Secondary | ICD-10-CM | POA: Diagnosis not present

## 2021-12-13 DIAGNOSIS — H353131 Nonexudative age-related macular degeneration, bilateral, early dry stage: Secondary | ICD-10-CM | POA: Diagnosis not present

## 2021-12-13 DIAGNOSIS — H33312 Horseshoe tear of retina without detachment, left eye: Secondary | ICD-10-CM | POA: Diagnosis not present

## 2021-12-13 DIAGNOSIS — H3562 Retinal hemorrhage, left eye: Secondary | ICD-10-CM | POA: Diagnosis not present

## 2021-12-13 DIAGNOSIS — H35363 Drusen (degenerative) of macula, bilateral: Secondary | ICD-10-CM | POA: Diagnosis not present

## 2021-12-20 ENCOUNTER — Encounter: Payer: Self-pay | Admitting: Internal Medicine

## 2021-12-28 ENCOUNTER — Other Ambulatory Visit: Payer: Self-pay | Admitting: Internal Medicine

## 2022-01-03 DIAGNOSIS — H3562 Retinal hemorrhage, left eye: Secondary | ICD-10-CM | POA: Diagnosis not present

## 2022-01-03 DIAGNOSIS — H353131 Nonexudative age-related macular degeneration, bilateral, early dry stage: Secondary | ICD-10-CM | POA: Diagnosis not present

## 2022-01-03 DIAGNOSIS — H43812 Vitreous degeneration, left eye: Secondary | ICD-10-CM | POA: Diagnosis not present

## 2022-01-03 DIAGNOSIS — H35363 Drusen (degenerative) of macula, bilateral: Secondary | ICD-10-CM | POA: Diagnosis not present

## 2022-01-03 DIAGNOSIS — H35453 Secondary pigmentary degeneration, bilateral: Secondary | ICD-10-CM | POA: Diagnosis not present

## 2022-01-03 DIAGNOSIS — H25813 Combined forms of age-related cataract, bilateral: Secondary | ICD-10-CM | POA: Diagnosis not present

## 2022-01-03 DIAGNOSIS — H31092 Other chorioretinal scars, left eye: Secondary | ICD-10-CM | POA: Diagnosis not present

## 2022-01-23 ENCOUNTER — Encounter: Payer: Self-pay | Admitting: Internal Medicine

## 2022-02-01 ENCOUNTER — Other Ambulatory Visit: Payer: Self-pay | Admitting: Family Medicine

## 2022-02-01 DIAGNOSIS — F429 Obsessive-compulsive disorder, unspecified: Secondary | ICD-10-CM

## 2022-02-01 NOTE — Telephone Encounter (Signed)
Is this okay to refill? 

## 2022-02-02 ENCOUNTER — Other Ambulatory Visit: Payer: Self-pay | Admitting: Family Medicine

## 2022-02-02 DIAGNOSIS — I10 Essential (primary) hypertension: Secondary | ICD-10-CM

## 2022-02-05 ENCOUNTER — Encounter: Payer: Self-pay | Admitting: Internal Medicine

## 2022-02-08 NOTE — Progress Notes (Signed)
Cardiology Office Note:    Date:  02/09/2022   ID:  [name] [name], DOB August 01, 1945, MRN [id number]  PCP:  Denita Lung, MD  [facility] Cardiologist:  Werner Lean, MD  [place] Electrophysiologist:  None   CC: AF follow up  History of Present Illness:    [name] [name] is a 76 y.o. male with a hx of PAF, HTN, HLD who presents for evaluation 05/26/20.  2022:  In interim of this visit, patient echo and heart monitor with runs of AF.  Had CAC scan in interim showing AV Calcium. Started rosuvastatin 10.  Patient notes that he is doing pretty well.   Since last visit notes that he hasn't had an afib episode since May 2022 . There are no interval hospital/ED visit.    Walks every day with normal energy. No chest pain or pressure.  No SOB/DOE and no PND/Orthopnea.  No weight gain or leg swelling.  No palpitations or syncope. No PRN diltiazem needed.   Past Medical History:  Diagnosis Date   Adenomatous polyp of colon 11/13/2017   Anxiety    Depression    Diverticulosis of colon    History of atrial fibrillation 07/28/2012   History of atrial fibrillation without current medication    episode 2014 and 2015 in [place], [place] (copy of clinical summary scanned in epic from [place] [facility])     History of esophageal dilatation    History of esophageal stricture 04/01/2013   History of kidney stones    History of kidney stones    Hyperlipidemia    mild   Hyperlipidemia with target LDL less than 100 07/04/2011   Hypertension    Left ureteral stone    Obsessive compulsive disorder    OCD (obsessive compulsive disorder) 07/04/2011   PAF (paroxysmal atrial fibrillation) ([place]) 05/26/2020   Prostate cancer ([place]) UROLOGIST-  DR DALHSTEDT   Gleason 3+4,  PSA 3.93, vol 25.2cc--  s/p  prostatectomy 12-24-2013    Past Surgical History:  Procedure Laterality Date   COLONOSCOPY  2009   Dr. Deatra Ina   HERNIA REPAIR  as child   LYMPHADENECTOMY Bilateral 12/24/2013    Procedure: LYMPHADENECTOMY;  Surgeon: Raynelle Bring, MD;  Location: [facility];  Service: Urology;  Laterality: Bilateral;   ROBOT ASSISTED LAPAROSCOPIC RADICAL PROSTATECTOMY N/A 12/24/2013   Procedure: ROBOTIC ASSISTED LAPAROSCOPIC RADICAL PROSTATECTOMY LEVEL 2;  Surgeon: Raynelle Bring, MD;  Location: [facility];  Service: Urology;  Laterality: N/A;   TONSILLECTOMY     WISDOM TOOTH EXTRACTION      Current Medications: Current Meds  Medication Sig   clonazePAM (KLONOPIN) 0.5 MG tablet Take 1 tablet (0.5 mg total) by mouth 2 (two) times daily as needed.   diltiazem (CARDIZEM) 30 MG tablet TAKE 1 TABLET BY MOUTH FOUR TIMES DAILY AS NEEDED FOR HEART RATE GREATER THAN 110   ELIQUIS 5 MG TABS tablet TAKE 1 TABLET(5 MG) BY MOUTH TWICE DAILY   Fluvoxamine Maleate 100 MG CP24 TAKE 2 CAPSULES BY MOUTH EVERY DAY   ibuprofen (ADVIL,MOTRIN) 200 MG tablet Take 400 mg by mouth every 6 (six) hours as needed for headache or moderate pain.   losartan-hydrochlorothiazide (HYZAAR) 50-12.5 MG tablet TAKE 1 TABLET BY MOUTH DAILY   Multiple Vitamins-Minerals (MULTIVITAMIN WITH MINERALS) tablet Take 1 tablet by mouth daily.   rosuvastatin (CRESTOR) 10 MG tablet Take 1 tablet (10 mg total) by mouth daily. Please call [phone] to schedule an appointment for future refills. Thank you. 1st attempt.  Allergies:   Patient has no known allergies.   Social History   Socioeconomic History   Marital status: Single    Spouse name: Not on file   Number of children: Not on file   Years of education: Not on file   Highest education level: Not on file  Occupational History   Not on file  Tobacco Use   Smoking status: Former    Types: Cigarettes    Quit date: 04/16/2005    Years since quitting: 16.8   Smokeless tobacco: Never  Vaping Use   Vaping Use: Never used  Substance and Sexual Activity   Alcohol use: Yes    Alcohol/week: 14.0 standard drinks of alcohol    Types: 14 Glasses of wine per week    Comment:  2glasses of wine per night per pt   Drug use: No   Sexual activity: Not Currently  Other Topics Concern   Not on file  Social History Narrative   Not on file   Social Determinants of Health   Financial Resource Strain: Not on file  Food Insecurity: Not on file  Transportation Needs: Not on file  Physical Activity: Not on file  Stress: Not on file  Social Connections: Not on file    Social: Jacqulyn Liner and [place] visits in the past; loves to travel; I recommended [place] for his 2024 trip  Family History: The patient's family history includes Bladder Cancer in his father. There is no history of Colon cancer, Esophageal cancer, Stomach cancer, Colon polyps, or Rectal cancer. History of coronary artery disease notable for no members. History of heart failure notable for mother. History of arrhythmia notable for no members.  ROS:   Please see the history of present illness.     All other systems reviewed and are negative.  EKGs/Labs/Other Studies Reviewed:    The following studies were reviewed today:  EKG:   02/09/22: Sinus bradycardia 1st heart block with heart rate 48 01/10/21: Sinus bradycardia reate 57 1st HB 05/24/20 Sinus Tachycardia rate 118 Right Atrial Enlargement  Cardiac Event Monitoring: Date: 08/01/20 Results: Patient had a minimum heart rate of 37 bpm, maximum heart rate of 135 bpm, and average heart rate of 57 bpm. Predominant underlying rhythm was sinus rhythm. One run of atrial fibrillation occurred lasting 51 seconds with a max rate of 121 bpm at fastest. Isolated PACs were rare (<1.0%). Isolated PVCs were rare (<1.0%). No evidence of complete heart block. Triggered and diary events associated with sinus rhythm.   Asymptomatic, short run of atrial fibrillation.    Transthoracic Echocardiogram: Date: 07/11/20 Results: 1. Left ventricular ejection fraction, by estimation, is 65 to 70%. The  left ventricle has normal function. The left ventricle has no  regional  wall motion abnormalities. Left ventricular diastolic parameters were  normal.   2. Right ventricular systolic function is normal. The right ventricular  size is normal.   3. The mitral valve is normal in structure. Trivial mitral valve  regurgitation.   4. The aortic valve is tricuspid. Aortic valve regurgitation is not  visualized. Mild to moderate aortic valve sclerosis/calcification is  present, without any evidence of aortic stenosis.   5. The inferior vena cava is normal in size with greater than 50%  respiratory variability, suggesting right atrial pressure of 3 mmHg.   Cardiac CT: Date: 09/08/20 Results: Aortic valve calcification and aortic atherosclerosis   Recent Labs: 05/03/2021: ALT 21; BUN 21; Creatinine, Ser 1.14; Hemoglobin 15.8; Platelets 176; Potassium 4.3; Sodium 145  Recent Lipid Panel    Component Value Date/Time   CHOL 172 05/03/2021 1045   TRIG 111 05/03/2021 1045   HDL 85 05/03/2021 1045   CHOLHDL 2.0 05/03/2021 1045   CHOLHDL 2.9 11/08/2016 1551   VLDL 23 11/08/2016 1551   LDLCALC 68 05/03/2021 1045    Physical Exam:    VS:  BP 120/80   Pulse (!) 46   Ht 5' 9.5" (1.765 m)   Wt 159 lb 6.4 oz (72.3 kg)   SpO2 99%   BMI 23.20 kg/m     Wt Readings from Last 3 Encounters:  02/09/22 159 lb 6.4 oz (72.3 kg)  05/03/21 155 lb 9.6 oz (70.6 kg)  01/10/21 155 lb (70.3 kg)    GEN:  Well nourished, well developed in no acute distress HEENT: Normal NECK: No JVD LYMPHATICS: No lymphadenopathy CARDIAC: Regular bradycardia, no murmurs, rubs, gallops RESPIRATORY:  Clear to auscultation without rales, wheezing or rhonchi  ABDOMEN: Soft, non-tender, non-distended MUSCULOSKELETAL:  No edema; No deformity  SKIN: Warm and dry NEUROLOGIC:  Alert and oriented x 3; notable right eye tick PSYCHIATRIC:  Normal affect   ASSESSMENT:    1. PAF (paroxysmal atrial fibrillation) ([place])   2. Aortic atherosclerosis ([place])   3. Hyperlipidemia with target LDL  less than 100   4. Essential hypertension     PLAN:    Asymptomatic bradycardia with 1st HB Paroxysmal Atrial Fibrillation - Risk factors include HTN - CHADSVASC=2. - continue eliquis - Continue rate control with PRN diltiazem 30 mg  - no evidence of chronotropic incompetence; low threshold for POET/ Heart Monitor; discussed symptoms to look out for  HLD Aortic Atherosclerosis AV Calcification - LDL < 70 - continue current statin    Essential Hypertension - ambulatory blood pressure controlled, will continue ambulatory BP monitoring; - continue home medications  One year me or APP    Medication Adjustments/Labs and Tests Ordered: Current medicines are reviewed at length with the patient today.  Concerns regarding medicines are outlined above.  Orders Placed This Encounter  Procedures   EKG 12-Lead    No orders of the defined types were placed in this encounter.    Patient Instructions  Medication Instructions:  Your physician recommends that you continue on your current medications as directed. Please refer to the Current Medication list given to you today.  *If you need a refill on your cardiac medications before your next appointment, please call your pharmacy*   Lab Work: NONE If you have labs (blood work) drawn today and your tests are completely normal, you will receive your results only by: [place] (if you have MyChart) OR A paper copy in the mail If you have any lab test that is abnormal or we need to change your treatment, we will call you to review the results.   Testing/Procedures: NONE   Follow-Up: At [facility], you and your health needs are our priority.  As part of our continuing mission to provide you with exceptional heart care, we have created designated Provider Care Teams.  These Care Teams include your primary Cardiologist (physician) and Advanced Practice Providers (APPs -  Physician Assistants and Nurse Practitioners)  who all work together to provide you with the care you need, when you need it.  We recommend signing up for the patient portal called "MyChart".  Sign up information is provided on this After Visit Summary.  MyChart is used to connect with patients for Virtual Visits (Telemedicine).  Patients are able  to view lab/test results, encounter notes, upcoming appointments, etc.  Non-urgent messages can be sent to your provider as well.   To learn more about what you can do with MyChart, go to [email or web address].    Your next appointment:   1 year(s)  The format for your next appointment:   In Person  Provider:   Werner Lean, MD     Important Information About Sugar         Signed, Werner Lean, MD  02/09/2022 9:15 AM    [place]

## 2022-02-09 ENCOUNTER — Ambulatory Visit: Payer: Medicare PPO | Attending: Internal Medicine | Admitting: Internal Medicine

## 2022-02-09 ENCOUNTER — Encounter: Payer: Self-pay | Admitting: Internal Medicine

## 2022-02-09 VITALS — BP 120/80 | HR 46 | Ht 69.5 in | Wt 159.4 lb

## 2022-02-09 DIAGNOSIS — E785 Hyperlipidemia, unspecified: Secondary | ICD-10-CM | POA: Diagnosis not present

## 2022-02-09 DIAGNOSIS — I700 Atherosclerosis of aorta: Secondary | ICD-10-CM

## 2022-02-09 DIAGNOSIS — I10 Essential (primary) hypertension: Secondary | ICD-10-CM | POA: Diagnosis not present

## 2022-02-09 DIAGNOSIS — I480 Paroxysmal atrial fibrillation: Secondary | ICD-10-CM

## 2022-02-09 NOTE — Patient Instructions (Signed)
Medication Instructions:  Your physician recommends that you continue on your current medications as directed. Please refer to the Current Medication list given to you today.  *If you need a refill on your cardiac medications before your next appointment, please call your pharmacy*   Lab Work: NONE If you have labs (blood work) drawn today and your tests are completely normal, you will receive your results only by: [place] (if you have MyChart) OR A paper copy in the mail If you have any lab test that is abnormal or we need to change your treatment, we will call you to review the results.   Testing/Procedures: NONE   Follow-Up: At [facility], you and your health needs are our priority.  As part of our continuing mission to provide you with exceptional heart care, we have created designated Provider Care Teams.  These Care Teams include your primary Cardiologist (physician) and Advanced Practice Providers (APPs -  Physician Assistants and Nurse Practitioners) who all work together to provide you with the care you need, when you need it.  We recommend signing up for the patient portal called "MyChart".  Sign up information is provided on this After Visit Summary.  MyChart is used to connect with patients for Virtual Visits (Telemedicine).  Patients are able to view lab/test results, encounter notes, upcoming appointments, etc.  Non-urgent messages can be sent to your provider as well.   To learn more about what you can do with MyChart, go to [email or web address].    Your next appointment:   1 year(s)  The format for your next appointment:   In Person  Provider:   Werner Lean, MD     Important Information About Sugar

## 2022-02-27 ENCOUNTER — Emergency Department (HOSPITAL_COMMUNITY): Payer: Medicare PPO

## 2022-02-27 ENCOUNTER — Emergency Department (HOSPITAL_COMMUNITY)
Admission: EM | Admit: 2022-02-27 | Discharge: 2022-02-27 | Disposition: A | Discharge status: Home / Self Care | Payer: Medicare PPO | Attending: Emergency Medicine | Admitting: Emergency Medicine

## 2022-02-27 ENCOUNTER — Encounter (HOSPITAL_COMMUNITY): Payer: Self-pay

## 2022-02-27 DIAGNOSIS — I4892 Unspecified atrial flutter: Secondary | ICD-10-CM | POA: Diagnosis not present

## 2022-02-27 DIAGNOSIS — R002 Palpitations: Secondary | ICD-10-CM | POA: Diagnosis present

## 2022-02-27 DIAGNOSIS — R0789 Other chest pain: Secondary | ICD-10-CM | POA: Diagnosis not present

## 2022-02-27 DIAGNOSIS — Z79899 Other long term (current) drug therapy: Secondary | ICD-10-CM | POA: Diagnosis not present

## 2022-02-27 DIAGNOSIS — I4891 Unspecified atrial fibrillation: Secondary | ICD-10-CM | POA: Diagnosis not present

## 2022-02-27 DIAGNOSIS — Z7901 Long term (current) use of anticoagulants: Secondary | ICD-10-CM | POA: Insufficient documentation

## 2022-02-27 LAB — CBC WITH DIFFERENTIAL/PLATELET
Abs Immature Granulocytes: 0.03 10*3/uL (ref 0.00–0.07)
Basophils Absolute: 0.1 10*3/uL (ref 0.0–0.1)
Basophils Relative: 1 %
Eosinophils Absolute: 0.1 10*3/uL (ref 0.0–0.5)
Eosinophils Relative: 2 %
HCT: 48.5 % (ref 39.0–52.0)
Hemoglobin: 16.9 g/dL (ref 13.0–17.0)
Immature Granulocytes: 0 %
Lymphocytes Relative: 25 %
Lymphs Abs: 2.1 10*3/uL (ref 0.7–4.0)
MCH: 32.3 pg (ref 26.0–34.0)
MCHC: 34.8 g/dL (ref 30.0–36.0)
MCV: 92.7 fL (ref 80.0–100.0)
Monocytes Absolute: 0.8 10*3/uL (ref 0.1–1.0)
Monocytes Relative: 10 %
Neutro Abs: 5.2 10*3/uL (ref 1.7–7.7)
Neutrophils Relative %: 62 %
Platelets: 183 10*3/uL (ref 150–400)
RBC: 5.23 MIL/uL (ref 4.22–5.81)
RDW: 12.9 % (ref 11.5–15.5)
WBC: 8.3 10*3/uL (ref 4.0–10.5)
nRBC: 0 % (ref 0.0–0.2)

## 2022-02-27 LAB — BASIC METABOLIC PANEL
Anion gap: 9 (ref 5–15)
BUN: 18 mg/dL (ref 8–23)
CO2: 24 mmol/L (ref 22–32)
Calcium: 9.5 mg/dL (ref 8.9–10.3)
Chloride: 107 mmol/L (ref 98–111)
Creatinine, Ser: 1.11 mg/dL (ref 0.61–1.24)
GFR, Estimated: >60 mL/min (ref >60)
Glucose, Bld: 120 mg/dL — ABNORMAL HIGH (ref 70–99)
Potassium: 3.6 mmol/L (ref 3.5–5.1)
Sodium: 140 mmol/L (ref 135–145)

## 2022-02-27 LAB — MAGNESIUM: Magnesium: 2.3 mg/dL (ref 1.7–2.4)

## 2022-02-27 MED ORDER — PROPOFOL 10 MG/ML IV BOLUS: 0.5000 mg/kg | Freq: Once | INTRAVENOUS | Status: AC

## 2022-02-27 MED ORDER — DILTIAZEM HCL 25 MG/5ML IV SOLN
10.0000 mg | Freq: Once | INTRAVENOUS | Status: AC
  Administered 2022-02-27: 10 mg via INTRAVENOUS
  Filled 2022-02-27: qty 5

## 2022-02-27 MED ORDER — PROPOFOL 10 MG/ML IV BOLUS
INTRAVENOUS | Status: AC
  Administered 2022-02-27: 36.2 mg via INTRAVENOUS
  Filled 2022-02-27: qty 20

## 2022-02-27 MED ORDER — LACTATED RINGERS IV BOLUS
1000.0000 mL | Freq: Once | INTRAVENOUS | Status: AC
  Administered 2022-02-27: 1000 mL via INTRAVENOUS

## 2022-02-27 NOTE — ED Triage Notes (Signed)
Pt arrived via POV, c/o palpitations. States hx of afib, felt heart racing around 11am, pt states sometimes self resolved.

## 2022-02-27 NOTE — ED Provider Notes (Signed)
[facility] DEPT Provider Note   CSN: [id number] Arrival date & time: 02/27/22  1652     History  Chief Complaint  Patient presents with   Palpitations    [name] [name] is a 76 y.o. male.  HPI 76 year old male presents with concern for atrial fibrillation.  He has a prior history of A-fib but has not had an episode in about 16 months.  Felt himself all of a sudden become lightheaded, dizzy, and some on and off mild chest discomfort at around noon today.  He denies any dyspnea.  This is similar to when he has been in A-fib before.  He has been compliant with the Eliquis.  He has as needed Cardizem and has taken 2 p.o. doses over 4 hours without relief.  When he is at rest he does not really feel any symptoms.  Right now he has no current chest pain.  Home Medications Prior to Admission medications   Medication Sig Start Date End Date Taking? Authorizing Provider  clonazePAM (KLONOPIN) 0.5 MG tablet Take 1 tablet (0.5 mg total) by mouth 2 (two) times daily as needed. 12/23/19   Denita Lung, MD  diltiazem (CARDIZEM) 30 MG tablet TAKE 1 TABLET BY MOUTH FOUR TIMES DAILY AS NEEDED FOR HEART RATE GREATER THAN 110 08/15/20   Chandrasekhar, Mahesh A, MD  ELIQUIS 5 MG TABS tablet TAKE 1 TABLET(5 MG) BY MOUTH TWICE DAILY 10/23/21   Chandrasekhar, Mahesh A, MD  Fluvoxamine Maleate 100 MG CP24 TAKE 2 CAPSULES BY MOUTH EVERY DAY 02/01/22   Denita Lung, MD  ibuprofen (ADVIL,MOTRIN) 200 MG tablet Take 400 mg by mouth every 6 (six) hours as needed for headache or moderate pain.    [provider]  losartan-hydrochlorothiazide (HYZAAR) 50-12.5 MG tablet TAKE 1 TABLET BY MOUTH DAILY 02/02/22   Denita Lung, MD  Multiple Vitamins-Minerals (MULTIVITAMIN WITH MINERALS) tablet Take 1 tablet by mouth daily.    [provider]  rosuvastatin (CRESTOR) 10 MG tablet Take 1 tablet (10 mg total) by mouth daily. Please call [phone] to schedule an appointment  for future refills. Thank you. 1st attempt. 12/28/21   Werner Lean, MD      Allergies    Patient has no known allergies.    Review of Systems   Review of Systems  Constitutional:  Positive for fatigue. Negative for fever.  Respiratory:  Negative for shortness of breath.   Cardiovascular:  Positive for chest pain. Negative for palpitations and leg swelling.  Gastrointestinal:  Negative for abdominal pain, diarrhea and vomiting.  Neurological:  Positive for weakness and light-headedness.    Physical Exam Updated Vital Signs BP 116/81   Pulse (!) 45   Temp 98.9 F (37.2 C) (Oral)   Resp 18   Ht 5' 9.5" (1.765 m)   Wt 72.3 kg   SpO2 97%   BMI 23.20 kg/m  Physical Exam Vitals and nursing note reviewed.  Constitutional:      General: He is not in acute distress.    Appearance: He is well-developed. He is not ill-appearing or diaphoretic.  HENT:     Head: Normocephalic and atraumatic.  Cardiovascular:     Rate and Rhythm: Regular rhythm. Tachycardia present.     Heart sounds: Normal heart sounds.  Pulmonary:     Effort: Pulmonary effort is normal.     Breath sounds: Normal breath sounds. No wheezing, rhonchi or rales.  Abdominal:     General: There is no distension.  Palpations: Abdomen is soft.     Tenderness: There is no abdominal tenderness.  Skin:    General: Skin is warm and dry.  Neurological:     Mental Status: He is alert.     ED Results / Procedures / Treatments   Labs (all labs ordered are listed, but only abnormal results are displayed) Labs Reviewed  BASIC METABOLIC PANEL - Abnormal; Notable for the following components:      Result Value   Glucose, Bld 120 (*)    All other components within normal limits  CBC WITH DIFFERENTIAL/PLATELET  MAGNESIUM    EKG EKG Interpretation  Date/Time:  Tuesday February 27 2022 17:06:57 EST Ventricular Rate:  161 PR Interval:  86 QRS Duration: 121 QT Interval:  338 QTC Calculation: 554 R  Axis:   -48 Text Interpretation: SVT, likely atrial flutter with rapid rate nonspecific ST changes, likely flutter waves vs rate related new since 2022 Confirmed by Sherwood Gambler [phone]) on 02/27/2022 6:03:20 PM   EKG Interpretation  Date/Time:  Tuesday February 27 2022 17:06:57 EST Ventricular Rate:  161 PR Interval:  86 QRS Duration: 121 QT Interval:  338 QTC Calculation: 554 R Axis:   -48 Text Interpretation: SVT, likely atrial flutter with rapid rate nonspecific ST changes, likely flutter waves vs rate related new since 2022 Confirmed by Sherwood Gambler [phone]) on 02/27/2022 6:03:20 PM        Radiology DG Chest Portable 1 View  Result Date: 02/27/2022 CLINICAL DATA:  Atrial flutter. EXAM: PORTABLE CHEST 1 VIEW COMPARISON:  05/24/2020 FINDINGS: The cardiac silhouette, mediastinal and hilar contours are within normal limits and stable. The lungs are clear. No pleural effusions or pulmonary lesions. The bony thorax is intact. IMPRESSION: No acute cardiopulmonary findings. Electronically Signed   By: Marijo Sanes M.D.   On: 02/27/2022 17:45    Procedures .Critical Care  Performed by: Sherwood Gambler, MD Authorized by: Sherwood Gambler, MD   Critical care provider statement:    Critical care time (minutes):  35   Critical care time was exclusive of:  Separately billable procedures and treating other patients   Critical care was necessary to treat or prevent imminent or life-threatening deterioration of the following conditions:  Cardiac failure and circulatory failure   Critical care was time spent personally by me on the following activities:  Development of treatment plan with patient or surrogate, discussions with consultants, evaluation of patient's response to treatment, examination of patient, ordering and review of laboratory studies, ordering and review of radiographic studies, ordering and performing treatments and interventions, pulse oximetry, re-evaluation of patient's  condition and review of old charts .Cardioversion  Date/Time: 02/27/2022 8:04 PM  Performed by: Sherwood Gambler, MD Authorized by: Sherwood Gambler, MD   Consent:    Consent obtained:  Verbal and written   Consent given by:  Patient   Risks discussed:  Cutaneous burn, death, induced arrhythmia and pain   Alternatives discussed:  Rate-control medication Pre-procedure details:    Cardioversion basis:  Emergent   Rhythm:  Atrial flutter   Electrode placement:  Anterior-posterior Patient sedated: Yes. Refer to sedation procedure documentation for details of sedation.  Attempt one:    Cardioversion mode:  Synchronous   Waveform:  Biphasic   Shock (Joules):  200   Shock outcome:  Conversion to normal sinus rhythm Post-procedure details:    Patient status:  Awake   Patient tolerance of procedure:  Tolerated well, no immediate complications .Sedation  Date/Time: 02/27/2022 8:04 PM  Performed by:  Sherwood Gambler, MD Authorized by: Sherwood Gambler, MD   Consent:    Consent obtained:  Verbal and written   Consent given by:  Patient   Risks discussed:  Allergic reaction, dysrhythmia, inadequate sedation, nausea, vomiting, respiratory compromise necessitating ventilatory assistance and intubation, prolonged sedation necessitating reversal and prolonged hypoxia resulting in organ damage Universal protocol:    Immediately prior to procedure, a time out was called: yes   Indications:    Procedure performed:  Cardioversion Pre-sedation assessment:    Time since last food or drink:  7 hours   ASA classification: class 2 - patient with mild systemic disease     Mallampati score:  I - soft palate, uvula, fauces, pillars visible   Pre-sedation assessments completed and reviewed: airway patency, cardiovascular function, hydration status, mental status, nausea/vomiting, pain level, respiratory function and temperature   Immediate pre-procedure details:    Reviewed: vital signs, relevant  labs/tests and NPO status     Verified: bag valve mask available, emergency equipment available, intubation equipment available, IV patency confirmed, oxygen available and suction available   Procedure details (see MAR for exact dosages):    Preoxygenation:  Nasal cannula   Sedation:  Propofol   Intended level of sedation: deep   Intra-procedure monitoring:  Blood pressure monitoring, cardiac monitor, continuous pulse oximetry, continuous capnometry, frequent LOC assessments and frequent vital sign checks   Intra-procedure events: none     Total Provider sedation time (minutes):  10 Post-procedure details:    Attendance: Constant attendance by certified staff until patient recovered     Recovery: Patient returned to pre-procedure baseline     Patient is stable for discharge or admission: yes     Procedure completion:  Tolerated well, no immediate complications     Medications Ordered in ED Medications  lactated ringers bolus 1,000 mL (0 mLs Intravenous Stopped 02/27/22 1900)  diltiazem (CARDIZEM) injection 10 mg (10 mg Intravenous Given 02/27/22 1742)  propofol (DIPRIVAN) 10 mg/mL bolus/IV push 36.2 mg (36.2 mg Intravenous Given 02/27/22 1957)    ED Course/ Medical Decision Making/ A&P                           Medical Decision Making Amount and/or Complexity of Data Reviewed Labs: ordered.    Details: No significant electrolyte disturbance including normal potassium and magnesium.  Normal WBC and hemoglobin Radiology: ordered and independent interpretation performed.    Details: No CHF ECG/medicine tests: ordered and independent interpretation performed.    Details: Atrial flutter with RVR.  Repeat after cardioversion shows normal sinus rhythm  Risk Prescription drug management.   Patient presents with symptomatic atrial flutter.  He has multiple reasons he is okay for cardioversion including recent onset just a few hours prior and he has been compliant with Eliquis.   Otherwise, he had brief chest pain but no active chest pain I think this is from rate rather than ischemia.  He originally wanted to delay cardioversion and try IV medicine and so he was given IV diltiazem but this did not have any effect.  He was also given some fluids.  After consent, procedural sedation was performed as well as cardioversion with good success.  He did well and was observed and blood pressure is normal.  Heart rate has remained okay.  Will discharge home with cardiology referral.  Continue his Eliquis.  CHA2DS2/VAS Stroke Risk Points  Current as of 12 minutes ago     2 >=  2 Points: High Risk  1 - 1.99 Points: Medium Risk  0 Points: Low Risk    Last Change: N/A      Details    This score determines the patient's risk of having a stroke if the  patient has atrial fibrillation.       Points Metrics  0 Has Congestive Heart Failure:  No    Current as of 12 minutes ago  0 Has Vascular Disease:  No    Current as of 12 minutes ago  1 Has Hypertension:  Yes    Current as of 12 minutes ago  1 Age:  88    Current as of 12 minutes ago  0 Has Diabetes:  No    Current as of 12 minutes ago  0 Had Stroke:  No  Had TIA:  No  Had Thromboembolism:  No    Current as of 12 minutes ago  0 Male:  No    Current as of 12 minutes ago   Final Clinical Impression(s) / ED Diagnoses Final diagnoses:  Atrial flutter with rapid ventricular response ([place])    Rx / DC Orders ED Discharge Orders          Ordered    Ambulatory referral to Cardiology       Comments: If you have not heard from the Cardiology office within the next 72 hours please call [phone].   02/27/22 8270              Sherwood Gambler, MD 02/27/22 2329

## 2022-02-27 NOTE — ED Notes (Signed)
Cardioversion- 1 shock performed, pt now in sinus rhythm

## 2022-02-27 NOTE — Discharge Instructions (Addendum)
Your heart went into an abnormal rhythm called atrial flutter today.  We used a cardioversion to revert this back to a normal rhythm.  You may be sore in your chest after this.  However, if you develop new or worsening chest pain, shortness of breath, recurrent palpitations, dizziness, lightheadedness, or any other new/concerning symptoms then return to the ER for evaluation or call 911.

## 2022-02-28 ENCOUNTER — Telehealth (HOSPITAL_COMMUNITY): Payer: Self-pay

## 2022-02-28 ENCOUNTER — Telehealth: Payer: Self-pay

## 2022-02-28 DIAGNOSIS — H43812 Vitreous degeneration, left eye: Secondary | ICD-10-CM | POA: Diagnosis not present

## 2022-02-28 DIAGNOSIS — H353131 Nonexudative age-related macular degeneration, bilateral, early dry stage: Secondary | ICD-10-CM | POA: Diagnosis not present

## 2022-02-28 DIAGNOSIS — H35453 Secondary pigmentary degeneration, bilateral: Secondary | ICD-10-CM | POA: Diagnosis not present

## 2022-02-28 DIAGNOSIS — H31092 Other chorioretinal scars, left eye: Secondary | ICD-10-CM | POA: Diagnosis not present

## 2022-02-28 DIAGNOSIS — H35363 Drusen (degenerative) of macula, bilateral: Secondary | ICD-10-CM | POA: Diagnosis not present

## 2022-02-28 DIAGNOSIS — H25813 Combined forms of age-related cataract, bilateral: Secondary | ICD-10-CM | POA: Diagnosis not present

## 2022-02-28 NOTE — Telephone Encounter (Signed)
Transition Care Management Follow-up Telephone Call Date of discharge and from where: [name] How have you been since you were released from the hospital? Good Any questions or concerns? No  Items Reviewed: Did the pt receive and understand the discharge instructions provided? Yes  Medications obtained and verified? Yes  Other? No  Any new allergies since your discharge? No  Dietary orders reviewed? Yes Do you have support at home? Yes   Home Care and Equipment/Supplies: Were home health services ordered? no  Follow up appointments reviewed:  PCP Hospital f/u appt confirmed? No  No f/u needed with PCP.  [facility] f/u appt confirmed? No  pt. Is feeling better and did not need to f/u with Cardiology right now.  Are transportation arrangements needed? No  If their condition worsens, is the pt aware to call PCP or go to the Emergency Dept.? Yes Was the patient provided with contact information for the PCP's office or ED? Yes Was to pt encouraged to call back with questions or concerns? Yes

## 2022-02-28 NOTE — Telephone Encounter (Signed)
Reached out to patient to schedule ED f/u appointment. No answer left voicemail. 

## 2022-03-14 ENCOUNTER — Telehealth: Payer: Self-pay | Admitting: Family Medicine

## 2022-03-14 ENCOUNTER — Other Ambulatory Visit: Payer: Self-pay

## 2022-03-14 MED ORDER — ROSUVASTATIN CALCIUM 10 MG PO TABS: 10.0000 mg | ORAL_TABLET | Freq: Every day | ORAL | 3 refills | Status: DC

## 2022-03-14 NOTE — Telephone Encounter (Signed)
Pt's medication was sent to pt's pharmacy as requested. Confirmation received.  °

## 2022-03-14 NOTE — Telephone Encounter (Signed)
Pt left message requesting refill of a medication but didn't state which one,  I trying calling pt back & had to leave a message.  It looks like the 2 meds that [facility] prescribes have refills, so not sure which one he needs.

## 2022-03-24 NOTE — Telephone Encounter (Signed)
Pt's Crestor was filled by another Dr.

## 2022-05-02 ENCOUNTER — Other Ambulatory Visit: Payer: Self-pay | Admitting: Internal Medicine

## 2022-05-02 DIAGNOSIS — I480 Paroxysmal atrial fibrillation: Secondary | ICD-10-CM

## 2022-05-02 NOTE — Telephone Encounter (Signed)
Eliquis '5mg'$  refill request received. Patient is 77 years old, weight-72.3kg, Crea-1.11 on 02/27/2022, Diagnosis-Afib, and last seen by Dr. Gasper Sells on 02/09/2022. Dose is appropriate based on dosing criteria. Will send in refill to requested pharmacy.

## 2022-05-08 ENCOUNTER — Ambulatory Visit (INDEPENDENT_AMBULATORY_CARE_PROVIDER_SITE_OTHER): Payer: Medicare PPO

## 2022-05-08 VITALS — Ht 69.0 in | Wt 159.0 lb

## 2022-05-08 DIAGNOSIS — Z0000 Encounter for general adult medical examination without abnormal findings: Secondary | ICD-10-CM

## 2022-05-08 NOTE — Patient Instructions (Signed)
[name] , Thank you for taking time to come for your Medicare Wellness Visit. I appreciate your ongoing commitment to your health goals. Please review the following plan we discussed and let me know if I can assist you in the future.   These are the goals we discussed:  Goals   None     This is a list of the screening recommended for you and due dates:  Health Maintenance  Topic Date Due   DTaP/Tdap/Td vaccine (2 - Td or Tdap) 05/06/2018   Flu Shot  11/14/2021   COVID-19 Vaccine (6 - 2023-24 season) 12/15/2021   Medicare Annual Wellness Visit  05/03/2022   Hepatitis C Screening: USPSTF Recommendation to screen - Ages 18-79 yo.  Completed   Zoster (Shingles) Vaccine  Completed   HPV Vaccine  Aged Out   Pneumonia Vaccine  Discontinued   Colon Cancer Screening  Discontinued    Advanced directives: yes  Conditions/risks identified: none  Next appointment: Follow up in one year for your annual wellness visit. 05/14/2023 @ 2:15pm telephone  Preventive Care 80 Years and Older, Male  Preventive care refers to lifestyle choices and visits with your health care provider that can promote health and wellness. What does preventive care include? A yearly physical exam. This is also called an annual well check. Dental exams once or twice a year. Routine eye exams. Ask your health care provider how often you should have your eyes checked. Personal lifestyle choices, including: Daily care of your teeth and gums. Regular physical activity. Eating a healthy diet. Avoiding tobacco and drug use. Limiting alcohol use. Practicing safe sex. Taking low doses of aspirin every day. Taking vitamin and mineral supplements as recommended by your health care provider. What happens during an annual well check? The services and screenings done by your health care provider during your annual well check will depend on your age, overall health, lifestyle risk factors, and family history of  disease. Counseling  Your health care provider may ask you questions about your: Alcohol use. Tobacco use. Drug use. Emotional well-being. Home and relationship well-being. Sexual activity. Eating habits. History of falls. Memory and ability to understand (cognition). Work and work Statistician. Screening  You may have the following tests or measurements: Height, weight, and BMI. Blood pressure. Lipid and cholesterol levels. These may be checked every 5 years, or more frequently if you are over 75 years old. Skin check. Lung cancer screening. You may have this screening every year starting at age 16 if you have a 30-pack-year history of smoking and currently smoke or have quit within the past 15 years. Fecal occult blood test (FOBT) of the stool. You may have this test every year starting at age 34. Flexible sigmoidoscopy or colonoscopy. You may have a sigmoidoscopy every 5 years or a colonoscopy every 10 years starting at age 30. Prostate cancer screening. Recommendations will vary depending on your family history and other risks. Hepatitis C blood test. Hepatitis B blood test. Sexually transmitted disease (STD) testing. Diabetes screening. This is done by checking your blood sugar (glucose) after you have not eaten for a while (fasting). You may have this done every 1-3 years. Abdominal aortic aneurysm (AAA) screening. You may need this if you are a current or former smoker. Osteoporosis. You may be screened starting at age 75 if you are at high risk. Talk with your health care provider about your test results, treatment options, and if necessary, the need for more tests. Vaccines  Your  health care provider may recommend certain vaccines, such as: Influenza vaccine. This is recommended every year. Tetanus, diphtheria, and acellular pertussis (Tdap, Td) vaccine. You may need a Td booster every 10 years. Zoster vaccine. You may need this after age 50. Pneumococcal 13-valent  conjugate (PCV13) vaccine. One dose is recommended after age 82. Pneumococcal polysaccharide (PPSV23) vaccine. One dose is recommended after age 75. Talk to your health care provider about which screenings and vaccines you need and how often you need them. This information is not intended to replace advice given to you by your health care provider. Make sure you discuss any questions you have with your health care provider. Document Released: 04/29/2015 Document Revised: 12/21/2015 Document Reviewed: 02/01/2015 Elsevier Interactive Patient Education  2017 [employer] Prevention in the Home Falls can cause injuries. They can happen to people of all ages. There are many things you can do to make your home safe and to help prevent falls. What can I do on the outside of my home? Regularly fix the edges of walkways and driveways and fix any cracks. Remove anything that might make you trip as you walk through a door, such as a raised step or threshold. Trim any bushes or trees on the path to your home. Use bright outdoor lighting. Clear any walking paths of anything that might make someone trip, such as rocks or tools. Regularly check to see if handrails are loose or broken. Make sure that both sides of any steps have handrails. Any raised decks and porches should have guardrails on the edges. Have any leaves, snow, or ice cleared regularly. Use sand or salt on walking paths during winter. Clean up any spills in your garage right away. This includes oil or grease spills. What can I do in the bathroom? Use night lights. Install grab bars by the toilet and in the tub and shower. Do not use towel bars as grab bars. Use non-skid mats or decals in the tub or shower. If you need to sit down in the shower, use a plastic, non-slip stool. Keep the floor dry. Clean up any water that spills on the floor as soon as it happens. Remove soap buildup in the tub or shower regularly. Attach bath mats  securely with double-sided non-slip rug tape. Do not have throw rugs and other things on the floor that can make you trip. What can I do in the bedroom? Use night lights. Make sure that you have a light by your bed that is easy to reach. Do not use any sheets or blankets that are too big for your bed. They should not hang down onto the floor. Have a firm chair that has side arms. You can use this for support while you get dressed. Do not have throw rugs and other things on the floor that can make you trip. What can I do in the kitchen? Clean up any spills right away. Avoid walking on wet floors. Keep items that you use a lot in easy-to-reach places. If you need to reach something above you, use a strong step stool that has a grab bar. Keep electrical cords out of the way. Do not use floor polish or wax that makes floors slippery. If you must use wax, use non-skid floor wax. Do not have throw rugs and other things on the floor that can make you trip. What can I do with my stairs? Do not leave any items on the stairs. Make sure that there are handrails  on both sides of the stairs and use them. Fix handrails that are broken or loose. Make sure that handrails are as long as the stairways. Check any carpeting to make sure that it is firmly attached to the stairs. Fix any carpet that is loose or worn. Avoid having throw rugs at the top or bottom of the stairs. If you do have throw rugs, attach them to the floor with carpet tape. Make sure that you have a light switch at the top of the stairs and the bottom of the stairs. If you do not have them, ask someone to add them for you. What else can I do to help prevent falls? Wear shoes that: Do not have high heels. Have rubber bottoms. Are comfortable and fit you well. Are closed at the toe. Do not wear sandals. If you use a stepladder: Make sure that it is fully opened. Do not climb a closed stepladder. Make sure that both sides of the stepladder  are locked into place. Ask someone to hold it for you, if possible. Clearly mark and make sure that you can see: Any grab bars or handrails. First and last steps. Where the edge of each step is. Use tools that help you move around (mobility aids) if they are needed. These include: Canes. Walkers. Scooters. Crutches. Turn on the lights when you go into a dark area. Replace any light bulbs as soon as they burn out. Set up your furniture so you have a clear path. Avoid moving your furniture around. If any of your floors are uneven, fix them. If there are any pets around you, be aware of where they are. Review your medicines with your doctor. Some medicines can make you feel dizzy. This can increase your chance of falling. Ask your doctor what other things that you can do to help prevent falls. This information is not intended to replace advice given to you by your health care provider. Make sure you discuss any questions you have with your health care provider. Document Released: 01/27/2009 Document Revised: 09/08/2015 Document Reviewed: 05/07/2014 Elsevier Interactive Patient Education  2017 [employer].

## 2022-05-08 NOTE — Progress Notes (Signed)
Virtual Visit via Telephone Note  I connected with  [name] on 05/08/22 at  1:30 PM EST by telephone and verified that I am speaking with the correct person using two identifiers.  Location: Patient: home Provider: [facility] Persons participating in the virtual visit: patient/Nurse Health Advisor   I discussed the limitations, risks, security and privacy concerns of performing an evaluation and management service by telephone and the availability of in person appointments. The patient expressed understanding and agreed to proceed.  Interactive audio and video telecommunications were attempted between this nurse and patient, however failed, due to patient having technical difficulties OR patient did not have access to video capability.  We continued and completed visit with audio only.  Some vital signs may be absent or patient reported.   Roger Shelter, LPN  Subjective:   [name] [name] is a 77 y.o. male who presents for Medicare Annual/Subsequent preventive examination.  Review of Systems    Cardiac Risk Factors include: advanced age (>53mn, >>9women);male gender;hypertension     Objective:    Today's Vitals   05/08/22 1334 05/08/22 1336  Weight: 159 lb (72.1 kg)   Height: '5\' 9"'$  (1.753 m)   PainSc: 2  3   PainLoc: Back    Body mass index is 23.48 kg/m.     05/08/2022    1:58 PM 05/08/2022    1:52 PM 05/03/2021    9:22 AM 05/24/2020   12:06 PM 12/23/2019    1:49 PM 12/05/2018    8:55 AM 11/13/2017    2:02 PM  Advanced Directives  Does Patient Have a Medical Advance Directive? Yes Yes Yes Yes Yes No No  Type of AParamed[place]of [place] will Living will;Healthcare Power of [place] CityLiving will [place] will Living will    Does patient want to make changes to medical advance directive? Yes (Inpatient - patient defers changing a medical advance directive at this time - Information given)  No  - Patient declined      Copy of [place]in Chart? No - copy requested  No - copy requested      Would patient like information on creating a medical advance directive?      Yes (MAU/Ambulatory/Procedural Areas - Information given) Yes (MAU/Ambulatory/Procedural Areas - Information given)    Current Medications (verified) Outpatient Encounter Medications as of 05/08/2022  Medication Sig   clonazePAM (KLONOPIN) 0.5 MG tablet Take 1 tablet (0.5 mg total) by mouth 2 (two) times daily as needed.   diltiazem (CARDIZEM) 30 MG tablet TAKE 1 TABLET BY MOUTH FOUR TIMES DAILY AS NEEDED FOR HEART RATE GREATER THAN 110   ELIQUIS 5 MG TABS tablet TAKE 1 TABLET(5 MG) BY MOUTH TWICE DAILY   Fluvoxamine Maleate 100 MG CP24 TAKE 2 CAPSULES BY MOUTH EVERY DAY   losartan-hydrochlorothiazide (HYZAAR) 50-12.5 MG tablet TAKE 1 TABLET BY MOUTH DAILY   Multiple Vitamins-Minerals (MULTIVITAMIN WITH MINERALS) tablet Take 1 tablet by mouth daily.   rosuvastatin (CRESTOR) 10 MG tablet Take 1 tablet (10 mg total) by mouth daily.   ibuprofen (ADVIL,MOTRIN) 200 MG tablet Take 400 mg by mouth every 6 (six) hours as needed for headache or moderate pain. (Patient not taking: Reported on 05/08/2022)   No facility-administered encounter medications on file as of 05/08/2022.    Allergies (verified) Patient has no known allergies.   History: Past Medical History:  Diagnosis Date   Adenomatous polyp of colon 11/13/2017  Anxiety    Depression    Diverticulosis of colon    History of atrial fibrillation 07/28/2012   History of atrial fibrillation without current medication    episode 2014 and 2015 in [place], [place] (copy of clinical summary scanned in epic from [place] [facility])     History of esophageal dilatation    History of esophageal stricture 04/01/2013   History of kidney stones    History of kidney stones    Hyperlipidemia    mild   Hyperlipidemia with target LDL less than  100 07/04/2011   Hypertension    Left ureteral stone    Obsessive compulsive disorder    OCD (obsessive compulsive disorder) 07/04/2011   PAF (paroxysmal atrial fibrillation) ([place]) 05/26/2020   Prostate cancer ([place]) UROLOGIST-  DR DALHSTEDT   Gleason 3+4,  PSA 3.93, vol 25.2cc--  s/p  prostatectomy 12-24-2013   Past Surgical History:  Procedure Laterality Date   COLONOSCOPY  2009   Dr. Deatra Ina   HERNIA REPAIR  as child   LYMPHADENECTOMY Bilateral 12/24/2013   Procedure: LYMPHADENECTOMY;  Surgeon: Raynelle Bring, MD;  Location: [facility];  Service: Urology;  Laterality: Bilateral;   ROBOT ASSISTED LAPAROSCOPIC RADICAL PROSTATECTOMY N/A 12/24/2013   Procedure: ROBOTIC ASSISTED LAPAROSCOPIC RADICAL PROSTATECTOMY LEVEL 2;  Surgeon: Raynelle Bring, MD;  Location: [facility];  Service: Urology;  Laterality: N/A;   TONSILLECTOMY     WISDOM TOOTH EXTRACTION     Family History  Problem Relation Age of Onset   Bladder Cancer Father    Colon cancer Neg Hx    Esophageal cancer Neg Hx    Stomach cancer Neg Hx    Colon polyps Neg Hx    Rectal cancer Neg Hx    Social History   Socioeconomic History   Marital status: Single    Spouse name: Not on file   Number of children: Not on file   Years of education: Not on file   Highest education level: Not on file  Occupational History   Not on file  Tobacco Use   Smoking status: Former    Types: Cigarettes    Quit date: 04/16/2005    Years since quitting: 17.0   Smokeless tobacco: Never  Vaping Use   Vaping Use: Never used  Substance and Sexual Activity   Alcohol use: Yes    Alcohol/week: 14.0 standard drinks of alcohol    Types: 14 Glasses of wine per week    Comment: 2glasses of wine per night per pt   Drug use: No   Sexual activity: Not Currently  Other Topics Concern   Not on file  Social History Narrative   Not on file   Social Determinants of Health   Financial Resource Strain: Low Risk  (05/08/2022)   Overall Financial Resource Strain  (CARDIA)    Difficulty of Paying Living Expenses: Not hard at all  Food Insecurity: No Food Insecurity (05/08/2022)   Hunger Vital Sign    Worried About Running Out of Food in the Last Year: Never true    Ran Out of Food in the Last Year: Never true  Transportation Needs: No Transportation Needs (05/08/2022)   PRAPARE - Hydrologist (Medical): No    Lack of Transportation (Non-Medical): No  Physical Activity: Sufficiently Active (05/08/2022)   Exercise Vital Sign    Days of Exercise per Week: 6 days    Minutes of Exercise per Session: 30 min  Stress: Stress Concern Present (05/08/2022)  [place] Questionnaire    Feeling of Stress : To some extent  Social Connections: Socially Isolated (05/08/2022)   Social Connection and Isolation Panel [NHANES]    Frequency of Communication with Friends and Family: More than three times a week    Frequency of Social Gatherings with Friends and Family: More than three times a week    Attends Religious Services: Never    Marine scientist or Organizations: No    Attends Music therapist: Never    Marital Status: Never married    Tobacco Counseling Counseling given: Not Answered   Clinical Intake:  Pre-visit preparation completed: Yes  Pain : 0-10 Pain Score: 3  Pain Type: Chronic pain Pain Location: Back Pain Orientation: Left Pain Descriptors / Indicators: Aching Pain Onset: More than a month ago Pain Frequency: Intermittent Pain Relieving Factors: ibuprofen, prednisone with flares  Pain Relieving Factors: ibuprofen, prednisone with flares  BMI - recorded: 23.48 Nutritional Status: BMI of 19-24  Normal Nutritional Risks: None Diabetes: No  How often do you need to have someone help you when you read instructions, pamphlets, or other written materials from your doctor or pharmacy?: 1 - Never  Diabetic?no  Interpreter Needed?:  No  Information entered by :: B.Veva Grimley,LPN   Activities of Daily Living    05/08/2022    1:59 PM  In your present state of health, do you have any difficulty performing the following activities:  Hearing? 0  Vision? 0  Difficulty concentrating or making decisions? 0  Walking or climbing stairs? 0  Dressing or bathing? 0  Doing errands, shopping? 0  Preparing Food and eating ? N  Using the Toilet? N  In the past six months, have you accidently leaked urine? N  Do you have problems with loss of bowel control? N  Managing your Medications? N  Managing your Finances? N  Housekeeping or managing your Housekeeping? N    Patient Care Team: Denita Lung, MD as PCP - General (Family Medicine) Werner Lean, MD as PCP - Cardiology (Cardiology)  Indicate any recent Medical Services you may have received from other than [facility] providers in the past year (date may be approximate).     Assessment:   This is a routine wellness examination for Rodert.  Hearing/Vision screen Hearing Screening - Comments:: Adequate hearing Vision Screening - Comments:: Cataract surgery vision very well;Dr Jonna Munro   Dietary issues and exercise activities discussed: Current Exercise Habits: Home exercise routine, Type of exercise: walking, Time (Minutes): 30, Frequency (Times/Week): 6, Weekly Exercise (Minutes/Week): 180, Intensity: Mild, Exercise limited by: None identified   Goals Addressed   None    Depression Screen    05/08/2022    1:46 PM 05/03/2021    9:24 AM 12/23/2019    1:51 PM 12/05/2018    8:33 AM 11/13/2017    1:41 PM 11/08/2016    1:56 PM 09/27/2015    9:20 AM  PHQ 2/9 Scores  PHQ - 2 Score 0 0 0 0 0 0 0    Fall Risk    05/08/2022    1:40 PM 05/03/2021    9:23 AM 12/23/2019    1:51 PM 12/05/2018    8:33 AM 11/13/2017    1:41 PM  [place] in the past year? 0 1 0 0 No  Number falls in past yr: 0 0     Injury with Fall? 1 0  Risk for fall due to : No  Fall Risks No Fall Risks     Follow up Education provided;Falls prevention discussed Falls evaluation completed       FALL RISK PREVENTION PERTAINING TO THE HOME:  Any stairs in or around the home? Yes  If so, are there any without handrails? Yes  Home free of loose throw rugs in walkways, pet beds, electrical cords, etc? No  Adequate lighting in your home to reduce risk of falls? Yes   ASSISTIVE DEVICES UTILIZED TO PREVENT FALLS:  Life alert? No  Use of a cane, walker or w/c? No  Grab bars in the bathroom? No  Shower chair or bench in shower? No  Elevated toilet seat or a handicapped toilet? No    Immunizations Immunization History  Administered Date(s) Administered   Fluad Quad(high Dose 65+) 12/05/2018, 12/23/2019   Influenza Split 02/11/2009   Influenza, High Dose Seasonal PF 12/10/2016, 03/22/2018   Influenza,inj,Quad PF,6+ Mos 12/25/2013   Influenza-Unspecified 01/15/2021   PFIZER Comirnaty(Gray Top)Covid-19 Tri-Sucrose Vaccine 07/24/2020   PFIZER(Purple Top)SARS-COV-2 Vaccination 06/13/2019, 07/03/2019, 01/18/2020   Pfizer Covid-19 Vaccine Bivalent Booster 28yr & up 01/25/2021   Pneumococcal Conjugate-13 09/27/2015   Pneumococcal Polysaccharide-23 05/06/2008   Tdap 05/06/2008   Zoster Recombinat (Shingrix) 03/22/2018, 06/09/2018   Zoster, Live 05/06/2008    TDAP status: Due, Education has been provided regarding the importance of this vaccine. Advised may receive this vaccine at local pharmacy or Health Dept. Aware to provide a copy of the vaccination record if obtained from local pharmacy or Health Dept. Verbalized acceptance and understanding.  Flu Vaccine status: Up to date  Pneumococcal vaccine status: Up to date  Covid-19 vaccine status: Completed vaccines  Qualifies for Shingles Vaccine? Yes   Zostavax completed Yes   Shingrix Completed?: Yes  Screening Tests Health Maintenance  Topic Date Due   DTaP/Tdap/Td (2 - Td or Tdap) 05/06/2018   INFLUENZA  VACCINE  11/14/2021   COVID-19 Vaccine (6 - 2023-24 season) 12/15/2021   Medicare Annual Wellness (AWV)  05/09/2023   Hepatitis C Screening  Completed   Zoster Vaccines- Shingrix  Completed   HPV VACCINES  Aged Out   Pneumonia Vaccine 75 Years old  Discontinued   COLONOSCOPY (Pts 45-418yrInsurance coverage will need to be confirmed)  Discontinued    Health Maintenance  Health Maintenance Due  Topic Date Due   DTaP/Tdap/Td (2 - Td or Tdap) 05/06/2018   INFLUENZA VACCINE  11/14/2021   COVID-19 Vaccine (6 - 2023-24 season) 12/15/2021    Colorectal cancer screening: Type of screening: Colonoscopy. Completed yes. Repeat every 5 years  Lung Cancer Screening: (Low Dose CT Chest recommended if Age 75-80ears, 30 pack-year currently smoking OR have quit w/in 15years.) does not qualify.   Lung Cancer Screening Referral: no  Additional Screening:  Hepatitis C Screening: does not qualify; Completed no  Vision Screening: Recommended annual ophthalmology exams for early detection of glaucoma and other disorders of the eye. Is the patient up to date with their annual eye exam?  Yes  Who is the provider or what is the name of the office in which the patient attends annual eye exams? Dr McWynetta Emeryf pt is not established with a provider, would they like to be referred to a provider to establish care? No .   Dental Screening: Recommended annual dental exams for proper oral hygiene  Community Resource Referral / Chronic Care Management: CRR required this visit?  No   CCM required this visit?  No      Plan:     I have personally reviewed and noted the following in the patient's chart:   Medical and social history Use of alcohol, tobacco or illicit drugs  Current medications and supplements including opioid prescriptions. Patient is not currently taking opioid prescriptions. Functional ability and status Nutritional status Physical activity Advanced directives List of  other physicians Hospitalizations, surgeries, and ER visits in previous 12 months Vitals Screenings to include cognitive, depression, and falls Referrals and appointments  In addition, I have reviewed and discussed with patient certain preventive protocols, quality metrics, and best practice recommendations. A written personalized care plan for preventive services as well as general preventive health recommendations were provided to patient.     Roger Shelter, LPN   [id number]   Nurse Notes: none

## 2022-05-10 ENCOUNTER — Encounter: Payer: Self-pay | Admitting: Family Medicine

## 2022-05-10 ENCOUNTER — Ambulatory Visit: Payer: Medicare PPO | Admitting: Family Medicine

## 2022-05-10 VITALS — BP 110/76 | HR 59 | Temp 97.6°F | Resp 14 | Ht 69.5 in | Wt 160.0 lb

## 2022-05-10 DIAGNOSIS — Z8546 Personal history of malignant neoplasm of prostate: Secondary | ICD-10-CM

## 2022-05-10 DIAGNOSIS — Z0000 Encounter for general adult medical examination without abnormal findings: Secondary | ICD-10-CM | POA: Diagnosis not present

## 2022-05-10 DIAGNOSIS — F429 Obsessive-compulsive disorder, unspecified: Secondary | ICD-10-CM

## 2022-05-10 DIAGNOSIS — Z8719 Personal history of other diseases of the digestive system: Secondary | ICD-10-CM | POA: Diagnosis not present

## 2022-05-10 DIAGNOSIS — G479 Sleep disorder, unspecified: Secondary | ICD-10-CM

## 2022-05-10 DIAGNOSIS — I700 Atherosclerosis of aorta: Secondary | ICD-10-CM | POA: Diagnosis not present

## 2022-05-10 DIAGNOSIS — Z86010 Personal history of colonic polyps: Secondary | ICD-10-CM

## 2022-05-10 DIAGNOSIS — I480 Paroxysmal atrial fibrillation: Secondary | ICD-10-CM | POA: Diagnosis not present

## 2022-05-10 DIAGNOSIS — M461 Sacroiliitis, not elsewhere classified: Secondary | ICD-10-CM

## 2022-05-10 DIAGNOSIS — J301 Allergic rhinitis due to pollen: Secondary | ICD-10-CM

## 2022-05-10 DIAGNOSIS — I10 Essential (primary) hypertension: Secondary | ICD-10-CM

## 2022-05-10 DIAGNOSIS — Z7901 Long term (current) use of anticoagulants: Secondary | ICD-10-CM

## 2022-05-10 MED ORDER — ZOLPIDEM TARTRATE ER 6.25 MG PO TBCR: 6.2500 mg | EXTENDED_RELEASE_TABLET | Freq: Every evening | ORAL | 0 refills | Status: DC | PRN

## 2022-05-10 NOTE — Progress Notes (Signed)
Complete physical exam  Patient: [name] [name]   DOB: February 08, 1946   77 y.o. Male  MRN: [id number]  Subjective:    Chief Complaint  Patient presents with   Annual Exam    Had AWV with [facility] on 05/08/2022. Fasting. No additional concerns.     [name] [name] is a 77 y.o. male who presents today for a complete physical exam. He reports consuming a general diet. Home exercise routine includes walking 5-6 days a week. He generally feels fairly well. He reports sleeping poorly.  He is enjoying his retirement.  He is finally gotten into a regular routine.  Continues on Eliquis for his underlying PAF.  He is also taking Crestor for his hyperlipidemia and history of aortic atherosclerosis.  Continues on losartan and does use Cardizem on an as-needed basis.  Luvox continues to work well for his underlying OCD.  He does complain of some left-sided low back pain that has been intermittent in nature.  No radiation, numbness or tingling.  He has a previous history of prostate cancer with robotic surgery.  Apparently is causing no difficulty with continence but has lost ability to get an erection.  His allergies seem to be under good control.  HE has a history of esophageal stricture but has had no difficulty with swallowing.  He also has had a colonoscopy and was told he does not need another colonoscopy.  Otherwise his family and social history as well as health maintenance and immunizations was reviewed   Most recent fall risk assessment:    05/08/2022    1:40 PM  [place] in the past year? 0  Number falls in past yr: 0  Injury with Fall? 1  Risk for fall due to : No Fall Risks  Follow up Education provided;Falls prevention discussed     Most recent depression screenings:    05/08/2022    1:46 PM 05/03/2021    9:24 AM  PHQ 2/9 Scores  PHQ - 2 Score 0 0    Vision:Within last year and Dental: Receives regular dental care    Patient Care Team: Denita Lung, MD as PCP - General (Family  Medicine) Werner Lean, MD as PCP - Cardiology (Cardiology)   Outpatient Medications Prior to Visit  Medication Sig   diltiazem (CARDIZEM) 30 MG tablet TAKE 1 TABLET BY MOUTH FOUR TIMES DAILY AS NEEDED FOR HEART RATE GREATER THAN 110   ELIQUIS 5 MG TABS tablet TAKE 1 TABLET(5 MG) BY MOUTH TWICE DAILY   Fluvoxamine Maleate 100 MG CP24 TAKE 2 CAPSULES BY MOUTH EVERY DAY   losartan-hydrochlorothiazide (HYZAAR) 50-12.5 MG tablet TAKE 1 TABLET BY MOUTH DAILY   Multiple Vitamins-Minerals (MULTIVITAMIN WITH MINERALS) tablet Take 1 tablet by mouth daily.   rosuvastatin (CRESTOR) 10 MG tablet Take 1 tablet (10 mg total) by mouth daily.   [DISCONTINUED] clonazePAM (KLONOPIN) 0.5 MG tablet Take 1 tablet (0.5 mg total) by mouth 2 (two) times daily as needed.   ibuprofen (ADVIL,MOTRIN) 200 MG tablet Take 400 mg by mouth every 6 (six) hours as needed for headache or moderate pain. (Patient not taking: Reported on 05/08/2022)   No facility-administered medications prior to visit.    Review of Systems  All other systems reviewed and are negative.         Objective:     BP 110/76   Pulse (!) 59   Temp 97.6 F (36.4 C) (Oral)   Resp 14   Ht 5' 9.5" (1.765  m)   Wt 160 lb (72.6 kg)   SpO2 98% Comment: room air  BMI 23.29 kg/m    Physical Exam  Alert and in no distress. Tympanic membranes and canals are normal. Pharyngeal area is normal. Neck is supple without adenopathy or thyromegaly. Cardiac exam shows a regular sinus rhythm without murmurs or gallops. Lungs are clear to auscultation. Back exam does show some tenderness palpation over the left upper SI joint with Corky Sox test being positive.  Negative straight leg raising.  Normal hip motion.     Assessment & Plan:    Routine general medical examination at a health care facility  Aortic atherosclerosis ([place]) - Plan: Lipid panel  Essential hypertension  PAF (paroxysmal atrial fibrillation) ([facility])  Seasonal allergic rhinitis  due to pollen  Chronic anticoagulation  History of colonic polyps  History of esophageal stricture  History of prostate cancer - 2016  Obsessive-compulsive disorder, unspecified type  Sacroiliitis ([place])  Sleep disturbance - Plan: zolpidem (AMBIEN CR) 6.25 MG CR tablet  Immunization History  Administered Date(s) Administered   Fluad Quad(high Dose 65+) 12/05/2018, 12/23/2019   Influenza Split 02/11/2009   Influenza, High Dose Seasonal PF 12/10/2016, 03/22/2018, 01/28/2022   Influenza,inj,Quad PF,6+ Mos 12/25/2013   Influenza-Unspecified 01/15/2021   PFIZER Comirnaty(Gray Top)Covid-19 Tri-Sucrose Vaccine 07/24/2020   PFIZER(Purple Top)SARS-COV-2 Vaccination 06/13/2019, 07/03/2019, 01/18/2020   Pfizer Covid-19 Vaccine Bivalent Booster 18yr & up 01/25/2021   Pneumococcal Conjugate-13 09/27/2015   Pneumococcal Polysaccharide-23 05/06/2008   Tdap 05/06/2008   Zoster Recombinat (Shingrix) 03/22/2018, 06/09/2018   Zoster, Live 05/06/2008    Health Maintenance  Topic Date Due   DTaP/Tdap/Td (2 - Td or Tdap) 05/06/2018   COVID-19 Vaccine (6 - 2023-24 season) 12/15/2021   Medicare Annual Wellness (AWV)  05/11/2023   INFLUENZA VACCINE  Completed   Hepatitis C Screening  Completed   Zoster Vaccines- Shingrix  Completed   HPV VACCINES  Aged Out   Pneumonia Vaccine 77 Years old  Discontinued   COLONOSCOPY (Pts 45-448yrInsurance coverage will need to be confirmed)  Discontinued    He is to get Tdap and RSV.  Discussed his sleep habit with him and gave information concerning sleep hygiene.  I will switch him to Ambien as in the past he was using Klonopin and explained that is not a good option.  Recommend he use this as needed and gave information on sleep hygiene. Also discussed sacroiliitis in terms of stretching, range of motion exercises.  If continued difficulty he is to return here for reevaluation and possible referral to physical therapy. Problem List Items Addressed This  Visit     Allergic rhinitis, seasonal   Aortic atherosclerosis ([place]  Relevant Orders   Lipid panel   Chronic anticoagulation   Essential hypertension   History of colonic polyps   History of esophageal stricture   History of prostate cancer   OCD (obsessive compulsive disorder)   PAF (paroxysmal atrial fibrillation) ([facility] [place]  Other Visit Diagnoses     Routine general medical examination at a health care facility    -  Primary   [facility]roili[facility] ([facility]      Sleep disturbance       Relevant Medications   zolpidem (AMBIEN CR) 6.25 MG CR tablet      Follow-up 1 year    JoJill AlexandersMD

## 2022-05-10 NOTE — Patient Instructions (Signed)
Insomnia Insomnia is a sleep disorder that makes it difficult to fall asleep or stay asleep. Insomnia can cause fatigue, low energy, difficulty concentrating, mood swings, and poor performance at work or school. There are three different ways to classify insomnia: Difficulty falling asleep. Difficulty staying asleep. Waking up too early in the morning. Any type of insomnia can be long-term (chronic) or short-term (acute). Both are common. Short-term insomnia usually lasts for 3 months or less. Chronic insomnia occurs at least three times a week for longer than 3 months. What are the causes? Insomnia may be caused by another condition, situation, or substance, such as: Having certain mental health conditions, such as anxiety and depression. Using caffeine, alcohol, tobacco, or drugs. Having gastrointestinal conditions, such as gastroesophageal reflux disease (GERD). Having certain medical conditions. These include: Asthma. Alzheimer's disease. Stroke. Chronic pain. An overactive thyroid gland (hyperthyroidism). Other sleep disorders, such as restless legs syndrome and sleep apnea. Menopause. Sometimes, the cause of insomnia may not be known. What increases the risk? Risk factors for insomnia include: Gender. Females are affected more often than males. Age. Insomnia is more common as people get older. Stress and certain medical and mental health conditions. Lack of exercise. Having an irregular work schedule. This may include working night shifts and traveling between different time zones. What are the signs or symptoms? If you have insomnia, the main symptom is having trouble falling asleep or having trouble staying asleep. This may lead to other symptoms, such as: Feeling tired or having low energy. Feeling nervous about going to sleep. Not feeling rested in the morning. Having trouble concentrating. Feeling irritable, anxious, or depressed. How is this diagnosed? This condition  may be diagnosed based on: Your symptoms and medical history. Your health care provider may ask about: Your sleep habits. Any medical conditions you have. Your mental health. A physical exam. How is this treated? Treatment for insomnia depends on the cause. Treatment may focus on treating an underlying condition that is causing the insomnia. Treatment may also include: Medicines to help you sleep. Counseling or therapy. Lifestyle adjustments to help you sleep better. Follow these instructions at home: Eating and drinking  Limit or avoid alcohol, caffeinated beverages, and products that contain nicotine and tobacco, especially close to bedtime. These can disrupt your sleep. Do not eat a large meal or eat spicy foods right before bedtime. This can lead to digestive discomfort that can make it hard for you to sleep. Sleep habits  Keep a sleep diary to help you and your health care provider figure out what could be causing your insomnia. Write down: When you sleep. When you wake up during the night. How well you sleep and how rested you feel the next day. Any side effects of medicines you are taking. What you eat and drink. Make your bedroom a dark, comfortable place where it is easy to fall asleep. Put up shades or blackout curtains to block light from outside. Use a white noise machine to block noise. Keep the temperature cool. Limit screen use before bedtime. This includes: Not watching TV. Not using your smartphone, tablet, or computer. Stick to a routine that includes going to bed and waking up at the same times every day and night. This can help you fall asleep faster. Consider making a quiet activity, such as reading, part of your nighttime routine. Try to avoid taking naps during the day so that you sleep better at night. Get out of bed if you are still awake after   15 minutes of trying to sleep. Keep the lights down, but try reading or doing a quiet activity. When you feel  sleepy, go back to bed. General instructions Take over-the-counter and prescription medicines only as told by your health care provider. Exercise regularly as told by your health care provider. However, avoid exercising in the hours right before bedtime. Use relaxation techniques to manage stress. Ask your health care provider to suggest some techniques that may work well for you. These may include: Breathing exercises. Routines to release muscle tension. Visualizing peaceful scenes. Make sure that you drive carefully. Do not drive if you feel very sleepy. Keep all follow-up visits. This is important. Contact a health care provider if: You are tired throughout the day. You have trouble in your daily routine due to sleepiness. You continue to have sleep problems, or your sleep problems get worse. Get help right away if: You have thoughts about hurting yourself or someone else. Get help right away if you feel like you may hurt yourself or others, or have thoughts about taking your own life. Go to your nearest emergency room or: Call 911. Call the National Suicide Prevention Lifeline at 1-800-273-8255 or 988. This is open 24 hours a day. Text the Crisis Text Line at 741741. Summary Insomnia is a sleep disorder that makes it difficult to fall asleep or stay asleep. Insomnia can be long-term (chronic) or short-term (acute). Treatment for insomnia depends on the cause. Treatment may focus on treating an underlying condition that is causing the insomnia. Keep a sleep diary to help you and your health care provider figure out what could be causing your insomnia. This information is not intended to replace advice given to you by your health care provider. Make sure you discuss any questions you have with your health care provider. Document Revised: 03/13/2021 Document Reviewed: 03/13/2021 Elsevier Patient Education  2023 [employer]  

## 2022-05-11 LAB — LIPID PANEL
Chol/HDL Ratio: 1.8 ratio (ref 0.0–5.0)
Cholesterol, Total: 151 mg/dL (ref 100–199)
HDL: 85 mg/dL (ref >39)
LDL Chol Calc (NIH): 51 mg/dL (ref 0–99)
Triglycerides: 81 mg/dL (ref 0–149)
VLDL Cholesterol Cal: 15 mg/dL (ref 5–40)

## 2022-06-08 DIAGNOSIS — H35453 Secondary pigmentary degeneration, bilateral: Secondary | ICD-10-CM | POA: Diagnosis not present

## 2022-06-08 DIAGNOSIS — H353131 Nonexudative age-related macular degeneration, bilateral, early dry stage: Secondary | ICD-10-CM | POA: Diagnosis not present

## 2022-06-08 DIAGNOSIS — H35363 Drusen (degenerative) of macula, bilateral: Secondary | ICD-10-CM | POA: Diagnosis not present

## 2022-06-08 DIAGNOSIS — H25813 Combined forms of age-related cataract, bilateral: Secondary | ICD-10-CM | POA: Diagnosis not present

## 2022-06-08 DIAGNOSIS — H31092 Other chorioretinal scars, left eye: Secondary | ICD-10-CM | POA: Diagnosis not present

## 2022-06-11 ENCOUNTER — Other Ambulatory Visit: Payer: Self-pay | Admitting: Family Medicine

## 2022-06-11 DIAGNOSIS — G479 Sleep disorder, unspecified: Secondary | ICD-10-CM

## 2022-06-11 NOTE — Telephone Encounter (Signed)
Refill request last apt was 05/10/22 next apt 05/22/23.

## 2022-07-05 DIAGNOSIS — L300 Nummular dermatitis: Secondary | ICD-10-CM | POA: Diagnosis not present

## 2022-07-05 DIAGNOSIS — L821 Other seborrheic keratosis: Secondary | ICD-10-CM | POA: Diagnosis not present

## 2022-07-31 ENCOUNTER — Other Ambulatory Visit: Payer: Self-pay | Admitting: Family Medicine

## 2022-07-31 DIAGNOSIS — I10 Essential (primary) hypertension: Secondary | ICD-10-CM

## 2022-09-01 ENCOUNTER — Emergency Department (HOSPITAL_COMMUNITY): Payer: Medicare PPO

## 2022-09-01 ENCOUNTER — Emergency Department (HOSPITAL_COMMUNITY)
Admission: EM | Admit: 2022-09-01 | Discharge: 2022-09-01 | Disposition: A | Discharge status: Home / Self Care | Payer: Medicare PPO | Attending: Emergency Medicine | Admitting: Emergency Medicine

## 2022-09-01 ENCOUNTER — Other Ambulatory Visit: Payer: Self-pay

## 2022-09-01 ENCOUNTER — Encounter (HOSPITAL_COMMUNITY): Payer: Self-pay

## 2022-09-01 DIAGNOSIS — R002 Palpitations: Secondary | ICD-10-CM | POA: Diagnosis present

## 2022-09-01 DIAGNOSIS — I4891 Unspecified atrial fibrillation: Secondary | ICD-10-CM | POA: Diagnosis not present

## 2022-09-01 LAB — MAGNESIUM: Magnesium: 1.9 mg/dL (ref 1.7–2.4)

## 2022-09-01 LAB — CBC
HCT: 46.3 % (ref 39.0–52.0)
Hemoglobin: 15.5 g/dL (ref 13.0–17.0)
MCH: 29.9 pg (ref 26.0–34.0)
MCHC: 33.5 g/dL (ref 30.0–36.0)
MCV: 89.2 fL (ref 80.0–100.0)
Platelets: 145 10*3/uL — ABNORMAL LOW (ref 150–400)
RBC: 5.19 MIL/uL (ref 4.22–5.81)
RDW: 12.7 % (ref 11.5–15.5)
WBC: 5.9 10*3/uL (ref 4.0–10.5)
nRBC: 0 % (ref 0.0–0.2)

## 2022-09-01 LAB — BASIC METABOLIC PANEL
Anion gap: 7 (ref 5–15)
BUN: 31 mg/dL — ABNORMAL HIGH (ref 8–23)
CO2: 26 mmol/L (ref 22–32)
Calcium: 8.9 mg/dL (ref 8.9–10.3)
Chloride: 100 mmol/L (ref 98–111)
Creatinine, Ser: 1.18 mg/dL (ref 0.61–1.24)
GFR, Estimated: >60 mL/min (ref >60)
Glucose, Bld: 170 mg/dL — ABNORMAL HIGH (ref 70–99)
Potassium: 3.7 mmol/L (ref 3.5–5.1)
Sodium: 133 mmol/L — ABNORMAL LOW (ref 135–145)

## 2022-09-01 MED ORDER — DILTIAZEM HCL 25 MG/5ML IV SOLN
10.0000 mg | Freq: Once | INTRAVENOUS | Status: AC
  Administered 2022-09-01: 10 mg via INTRAVENOUS
  Filled 2022-09-01: qty 5

## 2022-09-01 MED ORDER — PROPOFOL 10 MG/ML IV BOLUS: 0.5000 mg/kg | Freq: Once | INTRAVENOUS | Status: DC

## 2022-09-01 MED ORDER — LACTATED RINGERS IV BOLUS
1000.0000 mL | Freq: Once | INTRAVENOUS | Status: AC
  Administered 2022-09-01: 1000 mL via INTRAVENOUS

## 2022-09-01 MED ORDER — ONDANSETRON HCL 4 MG/2ML IJ SOLN: 4.0000 mg | Freq: Once | INTRAMUSCULAR | Status: DC

## 2022-09-01 NOTE — Discharge Instructions (Signed)
We had a long conversation regarding her care plan today.  Given your restoration of normal sinus rhythm without intervention, he can be safely discharged to follow-up with your cardiologist. We have considered starting you on diltiazem twice daily until you are seen by cardiology but you have elected to follow-up with them in the outpatient setting prior to making the decision about medications.  This is reasonable however if you have any new symptoms you will need to return for further care and management.

## 2022-09-01 NOTE — ED Notes (Signed)
Pads placed for cardioversion. Pt sat up for pad placement, then when lay back down, he went into a NSR 79-84 HR. MD informed.

## 2022-09-01 NOTE — ED Provider Notes (Signed)
[facility]PARTMENT AT [facility] Provider Note   CSN: [id number] Arrival date & time: 09/01/22  1223     History Chief Complaint  Patient presents with   Tachycardia    HPI [name] [name] is a 77 y.o. male presenting for palpitaitons.  Compliant with eliquis BID for the last 3 weeks Last meal 1 hour ago  Patient states that he started having palpitations earlier today approximately 4 hours prior to arrival.  Denies any fevers chills nausea vomiting syncope or chest pain.  He has started having some shortness of breath on arrival today. Has been compliant with all medications including his diltiazem.  Similar episode last month with uncertain underlying provoking factor Was recommended to follow-up very closely with cardiology but has not seen them yet..   Patient's recorded medical, surgical, social, medication list and allergies were reviewed in the Snapshot window as part of the initial history.   Review of Systems   Review of Systems  Constitutional:  Negative for chills and fever.  HENT:  Negative for ear pain and sore throat.   Eyes:  Negative for pain and visual disturbance.  Respiratory:  Positive for shortness of breath. Negative for cough.   Cardiovascular:  Positive for palpitations. Negative for chest pain.  Gastrointestinal:  Negative for abdominal pain and vomiting.  Genitourinary:  Negative for dysuria and hematuria.  Musculoskeletal:  Negative for arthralgias and back pain.  Skin:  Negative for color change and rash.  Neurological:  Negative for seizures and syncope.  All other systems reviewed and are negative.   Physical Exam Updated Vital Signs BP 109/68   Pulse 61   Temp 97.7 F (36.5 C) (Oral)   Resp 15   Ht 5\' 10"  (1.778 m)   Wt 70.3 kg   SpO2 98%   BMI 22.24 kg/m  Physical Exam Vitals and nursing note reviewed.  Constitutional:      General: He is not in acute distress.    Appearance: He is well-developed.  HENT:      Head: Normocephalic and atraumatic.  Eyes:     Conjunctiva/sclera: Conjunctivae normal.  Cardiovascular:     Rate and Rhythm: Tachycardia present. Rhythm irregular.     Heart sounds: No murmur heard. Pulmonary:     Effort: Pulmonary effort is normal. No respiratory distress.     Breath sounds: Normal breath sounds.  Abdominal:     Palpations: Abdomen is soft.     Tenderness: There is no abdominal tenderness.  Musculoskeletal:        General: No swelling.     Cervical back: Neck supple.  Skin:    General: Skin is warm and dry.     Capillary Refill: Capillary refill takes less than 2 seconds.  Neurological:     Mental Status: He is alert.  Psychiatric:        Mood and Affect: Mood normal.      ED Course/ Medical Decision Making/ A&P    Procedures .Critical Care  Performed by: Glyn Ade, MD Authorized by: Glyn Ade, MD   Critical care provider statement:    Critical care time (minutes):  30   Critical care was necessary to treat or prevent imminent or life-threatening deterioration of the following conditions:  Circulatory failure and cardiac failure (Tachycardia 170s)   Critical care was time spent personally by me on the following activities:  Development of treatment plan with patient or surrogate, discussions with consultants, evaluation of patient's response to treatment, examination  of patient, ordering and review of laboratory studies, ordering and review of radiographic studies, ordering and performing treatments and interventions, pulse oximetry, re-evaluation of patient's condition and review of old charts    Medications Ordered in ED Medications  ondansetron (ZOFRAN) injection 4 mg (4 mg Intravenous Not Given 09/01/22 1349)  lactated ringers bolus 1,000 mL (1,000 mLs Intravenous New Bag/Given 09/01/22 1333)  diltiazem (CARDIZEM) injection 10 mg (10 mg Intravenous Given 09/01/22 1332)    Medical Decision Making:    [name] [name] is a 77 y.o.  male who presented to the ED today with palpitations and tachycardia detailed above.     Patient's presentation is complicated by their history of multiple comorbid medical problems.  Patient placed on continuous vitals and telemetry monitoring while in ED which was reviewed periodically.   Complete initial physical exam performed, notably the patient  was tachycardic to the 180s systolic blood pressure 100.      Reviewed and confirmed nursing documentation for past medical history, family history, social history.    Initial Assessment:   With the patient's presentation of tachycardia, most likely diagnosis is atrial fibrillation with RVR possibly a flutter. Other diagnoses were considered including (but not limited to) SVT, ventricular tachycardia. These are considered less likely due to history of present illness and physical exam findings.   This is most consistent with an acute life/limb threatening illness complicated by underlying chronic conditions. Considered underlying pathology such as metabolic disruption, critical anemia, infectious pathology that the seem less likely.  Most likely underlying cardiogenic electrical aberrancy. Initial Plan:  Was preparing patient for emergent cardioversion given tachycardia and dyspnea.  While patient was being rolled onto his side he performed vagal maneuver and he spontaneously cardioverted to normal sinus rhythm Screening labs including CBC and Metabolic panel to evaluate for infectious or metabolic etiology of disease.  CXR to evaluate for structural/infectious intrathoracic pathology.  EKG to evaluate for cardiac pathology. Objective evaluation as below reviewed with plan for close reassessment  Initial Study Results:   Laboratory  All laboratory results reviewed without evidence of clinically relevant pathology.    EKG EKG was reviewed independently. Rate, rhythm, axis, intervals all examined and without medically relevant abnormality. ST  segments without concerns for elevations.    Radiology  All images reviewed independently. Agree with radiology report at this time.   DG Chest Port 1 View  Result Date: 09/01/2022 CLINICAL DATA:  Atrial fibrillation EXAM: PORTABLE CHEST 1 VIEW COMPARISON:  02/27/2022 FINDINGS: The heart size and mediastinal contours are within normal limits. Both lungs are clear. The visualized skeletal structures are unremarkable. IMPRESSION: No active cardiopulmonary disease. Electronically Signed   By: Charlett Nose M.D.   On: 09/01/2022 13:54      Final Assessment and Plan:   Patient's history present on his physical exam findings do not reveal any acute pathology.  A-fib with RVR resolved with vagal maneuvers and he stayed out of it.  Given a dose of IV diltiazem and patient stayed in normal sinus rhythm for a 3-hour observation window. He is ambulatory tolerating p.o. intake in no acute distress stable for outpatient care and management at this time.   Disposition:  I have considered need for hospitalization, however, considering all of the above, I believe this patient is stable for discharge at this time.  Patient/family educated about specific return precautions for given chief complaint and symptoms.  Patient/family educated about follow-up with PCP and orthopedics.     Patient/family expressed understanding of  return precautions and need for follow-up. Patient spoken to regarding all imaging and laboratory results and appropriate follow up for these results. All education provided in verbal form with additional information in written form. Time was allowed for answering of patient questions. Patient discharged.    Emergency Department Medication Summary:   Medications  ondansetron [facility]) injection 4 mg (4 mg Intravenous Not Given 09/01/22 1349)  lactated ringers bolus 1,000 mL (1,000 mLs Intravenous New Bag/Given 09/01/22 1333)  diltiazem (CARDIZEM) injection 10 mg (10 mg Intravenous Given  09/01/22 1332)         Clinical Impression:  1. Atrial fibrillation with rapid ventricular response (HCC)      Data Unavailable   Final Clinical Impression(s) / ED Diagnoses Final diagnoses:  Atrial fibrillation with rapid ventricular response [facility])    Rx / DC Orders ED Discharge Orders     None         Glyn Ade, MD 09/01/22 1527

## 2022-09-01 NOTE — ED Triage Notes (Addendum)
Patient said he was diagnosed with atrial flutter and has been feeling dizzy for 4 hours. Has been taking eloquis. Checked his heart rate and it was 150 at home. Denies shortness of breath or chest pain.

## 2022-09-07 ENCOUNTER — Telehealth: Payer: Self-pay | Admitting: *Deleted

## 2022-09-11 ENCOUNTER — Other Ambulatory Visit: Payer: Self-pay | Admitting: Family Medicine

## 2022-09-11 DIAGNOSIS — G479 Sleep disorder, unspecified: Secondary | ICD-10-CM

## 2022-09-11 MED ORDER — ZOLPIDEM TARTRATE ER 6.25 MG PO TBCR
6.2500 mg | EXTENDED_RELEASE_TABLET | Freq: Every day | ORAL | 0 refills | Status: DC
Start: 2022-09-11 — End: 2022-12-24

## 2022-10-29 ENCOUNTER — Other Ambulatory Visit: Payer: Self-pay | Admitting: Internal Medicine

## 2022-10-29 DIAGNOSIS — I480 Paroxysmal atrial fibrillation: Secondary | ICD-10-CM

## 2022-10-29 NOTE — Telephone Encounter (Signed)
Prescription refill request for Eliquis received. Indication:afib Last office visit:10/23 Scr:1.18  5/24 Age: 77 Weight:70.3  kg  Prescription refilled

## 2022-12-23 ENCOUNTER — Encounter: Payer: Self-pay | Admitting: Family Medicine

## 2022-12-24 ENCOUNTER — Other Ambulatory Visit: Payer: Self-pay | Admitting: Family Medicine

## 2022-12-24 DIAGNOSIS — G479 Sleep disorder, unspecified: Secondary | ICD-10-CM

## 2022-12-24 MED ORDER — ZOLPIDEM TARTRATE ER 6.25 MG PO TBCR: 6.2500 mg | EXTENDED_RELEASE_TABLET | Freq: Every day | ORAL | 0 refills | Status: DC

## 2023-01-07 ENCOUNTER — Telehealth: Payer: Self-pay | Admitting: Internal Medicine

## 2023-01-07 NOTE — Telephone Encounter (Signed)
Patient c/o Palpitations:  High priority if patient c/o lightheadedness, shortness of breath, or chest pain  How long have you had palpitations/irregular HR/ Afib? Are you having the symptoms now? Off and on the last two weeks   Are you currently experiencing lightheadedness, SOB or CP? Lightheadedness and dizziness   Do you have a history of afib (atrial fibrillation) or irregular heart rhythm? Yes   Have you checked your BP or HR? (document readings if available): BP seems okay   Are you experiencing any other symptoms? Loss of appetite.

## 2023-01-07 NOTE — Telephone Encounter (Signed)
Called pt in regards to lightheadedness and dizziness.  Reports started about 2 weeks ago.  Does not know if is r/t increased walking and sweating and not drinking enough water. Reports gets dizzy when going up stairs or standing up quickly.  BP ranges 140/90 to 124/80. Does not have recent HR feels may be in the 50's today.  Had an episode of Afib HR 140's last week that lasted about 5 hours.  Took PRN diltiazem X2. Yesterday had an episode that lasted an hour.  Also reports feels a little more tired than norma.  Advised pt to ensure gets 8 glasses of water daily.  Monitor BP and HR.  Pt has noted bradycardia.  Will send to MD to advise.

## 2023-01-21 NOTE — Telephone Encounter (Signed)
Left a message to call back.

## 2023-01-22 NOTE — Telephone Encounter (Signed)
Patient is returning call.  °

## 2023-01-22 NOTE — Telephone Encounter (Signed)
Pt reports hasn't had lightheadedness or dizziness since our last conversation.  Checked BP reports it was high.  Now running 115-120'2/70's.   Advised pt if symptoms begin again to notify our office may need changes to BP medication.  Pt expresses understanding.

## 2023-01-27 ENCOUNTER — Other Ambulatory Visit: Payer: Self-pay | Admitting: Family Medicine

## 2023-01-27 DIAGNOSIS — F429 Obsessive-compulsive disorder, unspecified: Secondary | ICD-10-CM

## 2023-02-05 ENCOUNTER — Telehealth: Payer: Self-pay | Admitting: Internal Medicine

## 2023-02-05 NOTE — Telephone Encounter (Signed)
Pt was calling into the office to speak with [facility] RN and Dr. Izora Ribas about ongoing afib for the last 3 days.  Pt states over the last 3 days, he's been in afib with rates all over the place. He states his rates can be anywhere from the 70's and jump to 150.   He states his rates this morning have been elevated in the 130s-150s.   The pt has not taken any of his PRN Dilt for his rapid afib.   Pt states he would like an appt for sometime this week for further evaluation of afib.   Pt denies any sob, doe, swelling to lower extremities, chest pain, orthopnea, dizziness, pre-syncopal or syncopal episodes.   He states he's just having palpitations/fluttering, especially when his rates are elevated with the afib.   Advised the pt to go ahead and administer his PRN diltiazem 30 mg, being his HR is in the 150s at this time.   Advised him to continue to monitor his rates/symptoms, and he can administer this medication 4 times daily if HR parameter is 110 bpm or greater.    Advised him to avoid any caffeine, chocolate, stimulants, salty/greasy foods.  Advised him to increase his po water intake and take it easy the rest of the day.  Scheduled him to see Dr. Izora Ribas tomorrow 10/23 at 2:20 pm.   Advised the pt in the meantime between now and his appt tomorrow with Dr. Izora Ribas, he should continue to monitor, take his prn dilt if parameters met, and report to the ED if symptoms worsen/persist.   Pt is aware that Dr. Izora Ribas and Alena Bills RN are out of the office today, but I will forward this message to them to make them aware of this plan.  Pt verbalized understanding and agrees with this plan.

## 2023-02-05 NOTE — Telephone Encounter (Signed)
STAT if HR is under 50 or over 120 (normal HR is 60-100 beats per minute)  What is your heart rate? 150 HR   Do you have a log of your heart rate readings (document readings)? No  Do you have any other symptoms? Some dizziness and no appetite.

## 2023-02-06 ENCOUNTER — Encounter: Payer: Self-pay | Admitting: Internal Medicine

## 2023-02-06 ENCOUNTER — Ambulatory Visit: Payer: Medicare PPO | Attending: Internal Medicine | Admitting: Internal Medicine

## 2023-02-06 ENCOUNTER — Ambulatory Visit (INDEPENDENT_AMBULATORY_CARE_PROVIDER_SITE_OTHER): Payer: Medicare PPO

## 2023-02-06 VITALS — BP 112/60 | HR 87 | Ht 70.0 in | Wt 159.6 lb

## 2023-02-06 DIAGNOSIS — I700 Atherosclerosis of aorta: Secondary | ICD-10-CM

## 2023-02-06 DIAGNOSIS — I480 Paroxysmal atrial fibrillation: Secondary | ICD-10-CM

## 2023-02-06 DIAGNOSIS — I10 Essential (primary) hypertension: Secondary | ICD-10-CM | POA: Diagnosis not present

## 2023-02-06 MED ORDER — DILTIAZEM HCL ER COATED BEADS 120 MG PO CP24: 120.0000 mg | ORAL_CAPSULE | Freq: Every day | ORAL | 3 refills | Status: DC

## 2023-02-06 NOTE — Progress Notes (Signed)
Cardiology Office Note:    Date:  02/06/2023   ID:  [name] [name], DOB 11-10-1945, MRN [id number]  PCP:  Ronnald Nian, MD  [facility] Cardiologist:  Christell Constant, MD  [facility] Electrophysiologist:  None   CC: AF follow up  History of Present Illness:    [name] [name] is a 77 y.o. male with a hx of PAF, HTN, HLD who presents for evaluation 05/26/20.  2022:  In interim of this visit, patient echo and heart monitor with runs of AF.  Had CAC scan in interim showing AV Calcium. Started rosuvastatin 10. 2023: no palpitations  [name] [name], a 77 year old individual with a history of atrial fibrillation, coronary artery calcification, aortic valve calcification, and aortic atherosclerosis, presents with worsening palpitations. He reports an increase in the frequency of atrial fibrillation episodes over the past month, with two episodes occurring without any obvious trigger. He also experienced a six-hour episode following the consumption of spicy food, which he identified as a potential trigger. Since this episode, he has noticed recurrent atrial fibrillation in the evenings, lasting several hours.  He denies any significant changes in lifestyle or diet, apart from a reduction in alcohol consumption. He has a long-standing history of insomnia, for which he takes Ambien and occasionally half a tablet of Klonopin. He denies any symptoms suggestive of obstructive sleep apnea.  During these episodes of atrial fibrillation, he has noticed a pattern of two heartbeats followed by a pause. He reports that the diltiazem he takes as needed seems to help reduce his heart rate during these episodes, but he still experiences symptoms. He has had episodes in the past where he has fallen due to dizziness, but the recent episodes have not been as severe. No chest pain or shortness of breath.    Past Medical History:  Diagnosis Date   Adenomatous polyp of colon 11/13/2017   Anxiety    Depression     Diverticulosis of colon    History of atrial fibrillation 07/28/2012   History of atrial fibrillation without current medication    episode 2014 and 2015 in [place], [place] ([facility])     History of esophageal dilatation    History of esophageal stricture 04/01/2013   History of kidney stones    History of kidney stones    Hyperlipidemia    mild   Hyperlipidemia with target LDL less than 100 07/04/2011   Hypertension    Left ureteral stone    Obsessive compulsive disorder    OCD (obsessive compulsive disorder) 07/04/2011   PAF (paroxysmal atrial fibrillation) (HCC) 05/26/2020   Prostate cancer (HCC) UROLOGIST-  DR DALHSTEDT   Gleason 3+4,  PSA 3.93, vol 25.2cc--  s/p  prostatectomy 12-24-2013    Past Surgical History:  Procedure Laterality Date   COLONOSCOPY  2009   Dr. Arlyce Dice   HERNIA REPAIR  as child   LYMPHADENECTOMY Bilateral 12/24/2013   Procedure: LYMPHADENECTOMY;  Surgeon: Heloise Purpura, MD;  Location: [facility];  Service: Urology;  Laterality: Bilateral;   ROBOT ASSISTED LAPAROSCOPIC RADICAL PROSTATECTOMY N/A 12/24/2013   Procedure: ROBOTIC ASSISTED LAPAROSCOPIC RADICAL PROSTATECTOMY LEVEL 2;  Surgeon: Heloise Purpura, MD;  Location: [facility];  Service: Urology;  Laterality: N/A;   TONSILLECTOMY     WISDOM TOOTH EXTRACTION      Current Medications: Current Meds  Medication Sig   clonazePAM (KLONOPIN) 0.5 MG tablet Take 0.5 mg by mouth as needed for anxiety.  diltiazem (CARDIZEM CD) 120 MG 24 hr capsule Take 1 capsule (120 mg total) by mouth daily.   diltiazem (CARDIZEM) 30 MG tablet TAKE 1 TABLET BY MOUTH FOUR TIMES DAILY AS NEEDED FOR HEART RATE GREATER THAN 110   ELIQUIS 5 MG TABS tablet TAKE 1 TABLET(5 MG) BY MOUTH TWICE DAILY   Fluvoxamine Maleate 100 MG CP24 TAKE 2 CAPSULES BY MOUTH EVERY DAY   ibuprofen (ADVIL,MOTRIN) 200 MG tablet Take 400 mg by mouth every 6 (six) hours as needed for headache or  moderate pain.   losartan-hydrochlorothiazide (HYZAAR) 50-12.5 MG tablet TAKE 1 TABLET BY MOUTH DAILY   Multiple Vitamins-Minerals (MULTIVITAMIN WITH MINERALS) tablet Take 1 tablet by mouth daily.   rosuvastatin (CRESTOR) 10 MG tablet Take 1 tablet (10 mg total) by mouth daily.   zolpidem (AMBIEN CR) 6.25 MG CR tablet Take 1 tablet (6.25 mg total) by mouth at bedtime.     Allergies:   Patient has no known allergies.   Social History   Socioeconomic History   Marital status: Single    Spouse name: Not on file   Number of children: Not on file   Years of education: Not on file   Highest education level: Not on file  Occupational History   Not on file  Tobacco Use   Smoking status: Former    Current packs/day: 0.00    Types: Cigarettes    Quit date: 04/16/2005    Years since quitting: 17.8   Smokeless tobacco: Never  Vaping Use   Vaping status: Never Used  Substance and Sexual Activity   Alcohol use: Yes    Alcohol/week: 14.0 standard drinks of alcohol    Types: 14 Glasses of wine per week    Comment: 2glasses of wine per night per pt   Drug use: No   Sexual activity: Not Currently  Other Topics Concern   Not on file  Social History Narrative   Not on file   Social Determinants of Health   Financial Resource Strain: Low Risk  (05/08/2022)   Overall Financial Resource Strain (CARDIA)    Difficulty of Paying Living Expenses: Not hard at all  Food Insecurity: No Food Insecurity (05/08/2022)   Hunger Vital Sign    Worried About Running Out of Food in the Last Year: Never true    Ran Out of Food in the Last Year: Never true  Transportation Needs: No Transportation Needs (05/08/2022)   PRAPARE - Administrator, Civil Service (Medical): No    Lack of Transportation (Non-Medical): No  Physical Activity: Sufficiently Active (05/08/2022)   Exercise Vital Sign    Days of Exercise per Week: 6 days    Minutes of Exercise per Session: 30 min  Stress: Stress Concern  Present (05/08/2022)   [employer] of Occupational Health - Occupational Stress Questionnaire    Feeling of Stress : To some extent  Social Connections: Socially Isolated (05/08/2022)   Social Connection and Isolation Panel [NHANES]    Frequency of Communication with Friends and Family: More than three times a week    Frequency of Social Gatherings with Friends and Family: More than three times a week    Attends Religious Services: Never    Database administrator or Organizations: No    Attends Banker Meetings: Never    Marital Status: Never married    Social: [place] and [place] visits in the past; loves to travel  Family History: The patient's family history includes Bladder Cancer  in his father. There is no history of Colon cancer, Esophageal cancer, Stomach cancer, Colon polyps, or Rectal cancer. History of coronary artery disease notable for no members. History of heart failure notable for mother. History of arrhythmia notable for no members.  ROS:   Please see the history of present illness.     EKGs/Labs/Other Studies Reviewed:    The following studies were reviewed today:  EKG:   02/09/22: Sinus bradycardia 1st heart block with heart rate 48 01/10/21: Sinus bradycardia reate 57 1st HB 05/24/20 Sinus Tachycardia rate 118 Right Atrial Enlargement  Cardiac Studies & Procedures       ECHOCARDIOGRAM  ECHOCARDIOGRAM COMPLETE 07/11/2020  Narrative ECHOCARDIOGRAM REPORT    Patient Name:   [name] [name]   Date of Exam: 07/11/2020 Medical Rec #:  [id number]     Height:       70.0 in Accession #:    [id number]    Weight:       157.4 lb Date of Birth:  06-02-1945     BSA:          1.885 m Patient Age:    74 years      BP:           112/70 mmHg Patient Gender: M             HR:           66 bpm. Exam Location:  [address]  Procedure: 2D Echo, Cardiac Doppler and Color Doppler  Indications:    I48.0 Paroxysmal Atrial Fibrillation  History:        Patient  has no prior history of Echocardiogram examinations. Risk Factors:Hypertension and Dyslipidemia.  Sonographer:    Daphine Deutscher RDCS Referring Phys: [id number] [facility] A Jarrin Staley  IMPRESSIONS   1. Left ventricular ejection fraction, by estimation, is 65 to 70%. The left ventricle has normal function. The left ventricle has no regional wall motion abnormalities. Left ventricular diastolic parameters were normal. 2. Right ventricular systolic function is normal. The right ventricular size is normal. 3. The mitral valve is normal in structure. Trivial mitral valve regurgitation. 4. The aortic valve is tricuspid. Aortic valve regurgitation is not visualized. Mild to moderate aortic valve sclerosis/calcification is present, without any evidence of aortic stenosis. 5. The inferior vena cava is normal in size with greater than 50% respiratory variability, suggesting right atrial pressure of 3 mmHg.  FINDINGS Left Ventricle: Left ventricular ejection fraction, by estimation, is 65 to 70%. The left ventricle has normal function. The left ventricle has no regional wall motion abnormalities. The left ventricular internal cavity size was small. There is no left ventricular hypertrophy. Left ventricular diastolic parameters were normal.  Right Ventricle: The right ventricular size is normal. Right vetricular wall thickness was not assessed. Right ventricular systolic function is normal.  Left Atrium: Left atrial size was normal in size.  Right Atrium: Right atrial size was normal in size.  Pericardium: There is no evidence of pericardial effusion.  Mitral Valve: The mitral valve is normal in structure. Trivial mitral valve regurgitation.  Tricuspid Valve: The tricuspid valve is normal in structure. Tricuspid valve regurgitation is trivial.  Aortic Valve: The aortic valve is tricuspid. Aortic valve regurgitation is not visualized. Mild to moderate aortic valve sclerosis/calcification is  present, without any evidence of aortic stenosis.  Pulmonic Valve: The pulmonic valve was normal in structure. Pulmonic valve regurgitation is mild.  Aorta: The aortic root and ascending aorta are structurally normal, with no evidence of dilitation.  Venous: The inferior vena cava is normal in size with greater than 50% respiratory variability, suggesting right atrial pressure of 3 mmHg.  IAS/Shunts: No atrial level shunt detected by color flow Doppler.   LEFT VENTRICLE PLAX 2D LVIDd:         3.20 cm  Diastology LVIDs:         2.00 cm  LV e' medial:    8.08 cm/s LV PW:         1.10 cm  LV E/e' medial:  8.5 LV IVS:        1.10 cm  LV e' lateral:   12.60 cm/s LVOT diam:     2.20 cm  LV E/e' lateral: 5.4 LV SV:         115 LV SV Index:   61 LVOT Area:     3.80 cm   RIGHT VENTRICLE             IVC RV Basal diam:  3.30 cm     IVC diam: 1.50 cm RV S prime:     15.80 cm/s TAPSE (M-mode): 2.2 cm  LEFT ATRIUM             Index       RIGHT ATRIUM           Index LA diam:        3.80 cm 2.02 cm/m  RA Area:     11.00 cm LA Vol (A2C):   38.8 ml 20.58 ml/m RA Volume:   25.30 ml  13.42 ml/m LA Vol (A4C):   31.6 ml 16.76 ml/m LA Biplane Vol: 35.5 ml 18.83 ml/m AORTIC VALVE LVOT Vmax:   137.00 cm/s LVOT Vmean:  111.000 cm/s LVOT VTI:    0.302 m  AORTA Ao Root diam: 3.70 cm Ao Asc diam:  3.40 cm  MITRAL VALVE MV Area (PHT): 2.69 cm    SHUNTS MV Decel Time: 282 msec    Systemic VTI:  0.30 m MV E velocity: 68.60 cm/s  Systemic Diam: 2.20 cm MV A velocity: 60.40 cm/s MV E/A ratio:  1.14  Dietrich Pates MD Electronically signed by Dietrich Pates MD Signature Date/Time: 07/11/2020/3:19:49 PM    Final    MONITORS  CARDIAC EVENT MONITOR 05/31/2020   CT SCANS  CT CARDIAC SCORING (SELF PAY ONLY) 09/08/2020  Addendum 09/08/2020  5:54 PM ADDENDUM REPORT: 09/08/2020 17:51  CLINICAL DATA:  37M with hypertension, hyperlipidemia, and atrial fibrillation for cardiovascular disease risk  stratification  EXAM: Coronary Calcium Score  TECHNIQUE: A gated, non-contrast computed tomography scan of the heart was performed using 3mm slice thickness. Axial images were analyzed on a dedicated workstation. Calcium scoring of the coronary arteries was performed using the Agatston method.  FINDINGS: Coronary arteries: Normal origins.  Coronary Calcium Score:  Left main: 0  Left anterior descending artery: 0  Left circumflex artery: 0  Right coronary artery: 0  Total: 0  Percentile: 0  Pericardium: Normal.  Ascending Aorta: Normal caliber.  Calcification of the aortic root.  The aortic valve is heavily calcified.  Non-cardiac: See separate report from [facility].  IMPRESSION: Coronary calcium score of 0. This was 0 percentile for age-, race-, and sex-matched controls.  The aortic valve is heavily calcified.  RECOMMENDATIONS: Coronary artery calcium (CAC) score is a strong predictor of incident coronary heart disease (CHD) and provides predictive information beyond traditional risk factors. CAC scoring is reasonable to use in the decision to withhold, postpone, or initiate statin therapy in intermediate-risk  or selected borderline-risk asymptomatic adults (age 40-75 years and LDL-C >=70 to <190 mg/dL) who do not have diabetes or established atherosclerotic cardiovascular disease (ASCVD).* In intermediate-risk (10-year ASCVD risk >=7.5% to <20%) adults or selected borderline-risk (10-year ASCVD risk >=5% to <7.5%) adults in whom a CAC score is measured for the purpose of making a treatment decision the following recommendations have been made:  If CAC=0, it is reasonable to withhold statin therapy and reassess in 5 to 10 years, as long as higher risk conditions are absent (diabetes mellitus, family history of premature CHD in first degree relatives (males <55 years; females <65 years), cigarette smoking, or LDL >=190 mg/dL).  If CAC is 1 to  99, it is reasonable to initiate statin therapy for patients >=39 years of age.  If CAC is >=100 or >=75th percentile, it is reasonable to initiate statin therapy at any age.  Cardiology referral should be considered for patients with CAC scores >=400 or >=75th percentile.  *2018 AHA/ACC/AACVPR/AAPA/ABC/ACPM/ADA/AGS/APhA/ASPC/NLA/PCNA Guideline on the Management of Blood Cholesterol: A Report of the American College of Cardiology/American Heart Association Task Force on Clinical Practice Guidelines. J Am Coll Cardiol. 2019;73(24):3168-3209.  Chilton Si, MD   Electronically Signed By: Chilton Si On: 09/08/2020 17:51  Narrative EXAM: OVER-READ INTERPRETATION  CT CHEST  The following report is an over-read performed by radiologist Dr. Jeronimo Greaves of [facility] on 09/08/2020. This over-read does not include interpretation of cardiac or coronary anatomy or pathology. The calcium score interpretation by the cardiologist is attached.  COMPARISON:  05/24/2020 chest radiograph.  FINDINGS: Vascular: Normal aortic caliber.  Aortic valve calcification.  Mediastinum/Nodes: No imaged thoracic adenopathy.  Lungs/Pleura: No pleural fluid.  Clear imaged lungs.  Upper Abdomen: Normal imaged portions of the liver, spleen, stomach, adrenal glands, right kidney.  Musculoskeletal: No acute osseous abnormality.  IMPRESSION: 1.  No acute findings in the imaged extracardiac chest. 2. Aortic valvular calcifications. Consider echocardiography to evaluate for valvular dysfunction.  Electronically Signed: By: Jeronimo Greaves M.D. On: 09/08/2020 15:50            Recent Labs: 09/01/2022: BUN 31; Creatinine, Ser 1.18; Hemoglobin 15.5; Magnesium 1.9; Platelets 145; Potassium 3.7; Sodium 133  Recent Lipid Panel    Component Value Date/Time   CHOL 151 05/10/2022 1105   TRIG 81 05/10/2022 1105   HDL 85 05/10/2022 1105   CHOLHDL 1.8 05/10/2022 1105   CHOLHDL 2.9  11/08/2016 1551   VLDL 23 11/08/2016 1551   LDLCALC 51 05/10/2022 1105    Physical Exam:    VS:  BP 112/60   Pulse 87   Ht 5\' 10"  (1.778 m)   Wt 72.4 kg   SpO2 97%   BMI 22.90 kg/m     Wt Readings from Last 3 Encounters:  02/06/23 72.4 kg  09/01/22 70.3 kg  05/10/22 72.6 kg    GEN:  Well nourished, well developed in no acute distress HEENT: Normal CARDIAC: Regular rhythm, no murmurs, rubs, gallops RESPIRATORY:  Clear to auscultation without rales, wheezing or rhonchi  ABDOMEN: Soft, non-tender, non-distended MUSCULOSKELETAL:  No edema; No deformity  SKIN: Warm and dry NEUROLOGIC:  Alert and oriented x 3; right eye tick has greatly improved PSYCHIATRIC:  Normal affect   ASSESSMENT:    1. PAF (paroxysmal atrial fibrillation) (HCC)   2. Aortic atherosclerosis (HCC)   3. Essential hypertension      PLAN:    Paroxysmal Atrial Fibrillation(with 02/27/22 EKG that looks more like AFL) History of 1st HB (  2023 - Risk factors include HTN and age - CHADSVASC=3. - continue eliquis -Start Diltiazem 120mg  PO daily extended release. -Order a heart monitor for 1 week. -consented for future exercise NM stress test for 1c agent if worsening symptoms - we have discussed consultation with electrophysiology for possible ablation if symptoms persist or worsen.   Essential Hypertension Well controlled on Losartan 50mg  and 12.5mg  daily. Potential for decreased blood pressure with new Diltiazem regimen. -Monitor for symptoms of fatigue or tiredness, may need to adjust Hyzaar dose if symptoms occur.  Hyperlipidemia Aortic Atherosclerosis AV Calcification LDL goal set under 70, currently well controlled on Rosuvastatin. -Continue Rosuvastatin.  Follow-up in 6 months or sooner if changes are made based on heart monitor results. - if no improvement in sx, 1c agent start and EP eval for ablation consideration    Medication Adjustments/Labs and Tests Ordered: Current medicines are  reviewed at length with the patient today.  Concerns regarding medicines are outlined above.  Orders Placed This Encounter  Procedures   LONG TERM MONITOR (3-14 DAYS)   EKG 12-Lead    Meds ordered this encounter  Medications   diltiazem (CARDIZEM CD) 120 MG 24 hr capsule    Sig: Take 1 capsule (120 mg total) by mouth daily.    Dispense:  90 capsule    Refill:  3     Patient Instructions  Medication Instructions:  Your physician has recommended you make the following change in your medication:  START: diltiazem (Cardizem) 120 mg by mouth once daily  *If you need a refill on your cardiac medications before your next appointment, please call your pharmacy*   Lab Work: NONE If you have labs (blood work) drawn today and your tests are completely normal, you will receive your results only by: MyChart Message (if you have MyChart) OR A paper copy in the mail If you have any lab test that is abnormal or we need to change your treatment, we will call you to review the results.   Testing/Procedures: Your physician has requested that you wear a heart monitor.    Follow-Up: At [facility], you and your health needs are our priority.  As part of our continuing mission to provide you with exceptional heart care, we have created designated Provider Care Teams.  These Care Teams include your primary Cardiologist (physician) and Advanced Practice Providers (APPs -  Physician Assistants and Nurse Practitioners) who all work together to provide you with the care you need, when you need it.  Your next appointment:   6 month(s)  Provider:   Christell Constant, MD     Other Instructions Christena Deem- Long Term Monitor Instructions  Your physician has requested you wear a ZIO patch monitor for 7 days.  This is a single patch monitor. [employer] supplies one patch monitor per enrollment. Additional stickers are not available. Please do not apply patch if you will be having a Nuclear  Stress Test,   Cardiac CT, MRI, or Chest Xray during the period you would be wearing the  monitor. The patch cannot be worn during these tests. You cannot remove and re-apply the  ZIO XT patch monitor.  Your ZIO patch monitor will be mailed 3 day USPS to your address on file. It may take 3-5 days  to receive your monitor after you have been enrolled.  Once you have received your monitor, please review the enclosed instructions. Your monitor  has already been registered assigning a specific monitor serial # to  you.  Billing and Patient Assistance Program Information  We have supplied [employer] with any of your insurance information on file for billing purposes. [employer] offers a sliding scale Patient Assistance Program for patients that do not have  insurance, or whose insurance does not completely cover the cost of the ZIO monitor.  You must apply for the Patient Assistance Program to qualify for this discounted rate.  To apply, please call [employer] at [phone], select option 4, select option 2, ask to apply for  Patient Assistance Program. Meredeth Ide will ask your household income, and how many people  are in your household. They will quote your out-of-pocket cost based on that information.  [employer] will also be able to set up a 19-month, interest-free payment plan if needed.  Applying the monitor   Shave hair from upper left chest.  Hold abrader disc by orange tab. Rub abrader in 40 strokes over the upper left chest as  indicated in your monitor instructions.  Clean area with 4 enclosed alcohol pads. Let dry.  Apply patch as indicated in monitor instructions. Patch will be placed under collarbone on left  side of chest with arrow pointing upward.  Rub patch adhesive wings for 2 minutes. Remove white label marked "1". Remove the white  label marked "2". Rub patch adhesive wings for 2 additional minutes.  While looking in a mirror, press and release button in center of patch. A small green  light will  flash 3-4 times. This will be your only indicator that the monitor has been turned on.  Do not shower for the first 24 hours. You may shower after the first 24 hours.  Press the button if you feel a symptom. You will hear a small click. Record Date, Time and  Symptom in the Patient Logbook.  When you are ready to remove the patch, follow instructions on the last 2 pages of Patient  Logbook. Stick patch monitor onto the last page of Patient Logbook.  Place Patient Logbook in the blue and white box. Use locking tab on box and tape box closed  securely. The blue and white box has prepaid postage on it. Please place it in the mailbox as  soon as possible. Your physician should have your test results approximately 7 days after the  monitor has been mailed back to [facility].  Call [facility] Customer Care at [phone] if you have questions regarding  your ZIO XT patch monitor. Call them immediately if you see an orange light blinking on your  monitor.  If your monitor falls off in less than 4 days, contact our Monitor department at [phone].  If your monitor becomes loose or falls off after 4 days call [employer] at [phone] for  suggestions on securing your monitor     Signed, Christell Constant, MD  02/06/2023 3:06 PM     [facility]

## 2023-02-06 NOTE — Progress Notes (Unsigned)
Enrolled for [employer] to mail a ZIO XT long term holter monitor to the patients address on file.  

## 2023-02-06 NOTE — Patient Instructions (Signed)
Medication Instructions:  Your physician has recommended you make the following change in your medication:  START: diltiazem (Cardizem) 120 mg by mouth once daily  *If you need a refill on your cardiac medications before your next appointment, please call your pharmacy*   Lab Work: NONE If you have labs (blood work) drawn today and your tests are completely normal, you will receive your results only by: MyChart Message (if you have MyChart) OR A paper copy in the mail If you have any lab test that is abnormal or we need to change your treatment, we will call you to review the results.   Testing/Procedures: Your physician has requested that you wear a heart monitor.    Follow-Up: At [facility], you and your health needs are our priority.  As part of our continuing mission to provide you with exceptional heart care, we have created designated Provider Care Teams.  These Care Teams include your primary Cardiologist (physician) and Advanced Practice Providers (APPs -  Physician Assistants and Nurse Practitioners) who all work together to provide you with the care you need, when you need it.  Your next appointment:   6 month(s)  Provider:   Christell Constant, MD     Other Instructions [name] [name]- Long Term Monitor Instructions  Your physician has requested you wear a ZIO patch monitor for 7 days.  This is a single patch monitor. [employer] supplies one patch monitor per enrollment. Additional stickers are not available. Please do not apply patch if you will be having a Nuclear Stress Test,   Cardiac CT, MRI, or Chest Xray during the period you would be wearing the  monitor. The patch cannot be worn during these tests. You cannot remove and re-apply the  ZIO XT patch monitor.  Your ZIO patch monitor will be mailed 3 day USPS to your address on file. It may take 3-5 days  to receive your monitor after you have been enrolled.  Once you have received your monitor, please review  the enclosed instructions. Your monitor  has already been registered assigning a specific monitor serial # to you.  Billing and Patient Assistance Program Information  We have supplied [employer] with any of your insurance information on file for billing purposes. [employer] offers a sliding scale Patient Assistance Program for patients that do not have  insurance, or whose insurance does not completely cover the cost of the ZIO monitor.  You must apply for the Patient Assistance Program to qualify for this discounted rate.  To apply, please call [employer] at [phone], select option 4, select option 2, ask to apply for  Patient Assistance Program. Meredeth Ide will ask your household income, and how many people  are in your household. They will quote your out-of-pocket cost based on that information.  [employer] will also be able to set up a 87-month, interest-free payment plan if needed.  Applying the monitor   Shave hair from upper left chest.  Hold abrader disc by orange tab. Rub abrader in 40 strokes over the upper left chest as  indicated in your monitor instructions.  Clean area with 4 enclosed alcohol pads. Let dry.  Apply patch as indicated in monitor instructions. Patch will be placed under collarbone on left  side of chest with arrow pointing upward.  Rub patch adhesive wings for 2 minutes. Remove white label marked "1". Remove the white  label marked "2". Rub patch adhesive wings for 2 additional minutes.  While looking in a mirror, press and  release button in center of patch. A small green light will  flash 3-4 times. This will be your only indicator that the monitor has been turned on.  Do not shower for the first 24 hours. You may shower after the first 24 hours.  Press the button if you feel a symptom. You will hear a small click. Record Date, Time and  Symptom in the Patient Logbook.  When you are ready to remove the patch, follow instructions on the last 2 pages of Patient   Logbook. Stick patch monitor onto the last page of Patient Logbook.  Place Patient Logbook in the blue and white box. Use locking tab on box and tape box closed  securely. The blue and white box has prepaid postage on it. Please place it in the mailbox as  soon as possible. Your physician should have your test results approximately 7 days after the  monitor has been mailed back to [facility].  Call [facility] Customer Care at [phone] if you have questions regarding  your ZIO XT patch monitor. Call them immediately if you see an orange light blinking on your  monitor.  If your monitor falls off in less than 4 days, contact our Monitor department at [phone].  If your monitor becomes loose or falls off after 4 days call [employer] at [phone] for  suggestions on securing your monitor

## 2023-02-09 DIAGNOSIS — I480 Paroxysmal atrial fibrillation: Secondary | ICD-10-CM | POA: Diagnosis not present

## 2023-02-17 ENCOUNTER — Encounter: Payer: Self-pay | Admitting: Internal Medicine

## 2023-02-18 ENCOUNTER — Telehealth: Payer: Self-pay | Admitting: Internal Medicine

## 2023-02-18 NOTE — Telephone Encounter (Signed)
  Per MyChart scheduling message:   This afternoon I finished my week on the heart monitor that you prescribed and mailed it in. I had no incidents of afib during those seven days. Ironically, I'm having one this evening and have taken my 30mm tablet of Diltiazem on top of the tablet I'm taking daily. My heart rate is currently 155. What I should have asked at my appointment on October 23rd is whether I can expect to continue having afib from time to time, and if so, how often. Less often? And, if it's often enough, if its occurrence is similar to what it was before I started taking daily, would another form of treatment be indicated? Thank you for your attention.   [name] [name]

## 2023-02-18 NOTE — Telephone Encounter (Signed)
See mychart message from 02/18/23.

## 2023-02-22 DIAGNOSIS — I480 Paroxysmal atrial fibrillation: Secondary | ICD-10-CM | POA: Diagnosis not present

## 2023-02-25 ENCOUNTER — Ambulatory Visit: Payer: Medicare PPO | Admitting: Internal Medicine

## 2023-02-25 ENCOUNTER — Telehealth: Payer: Self-pay

## 2023-02-25 DIAGNOSIS — I480 Paroxysmal atrial fibrillation: Secondary | ICD-10-CM

## 2023-02-25 DIAGNOSIS — I700 Atherosclerosis of aorta: Secondary | ICD-10-CM

## 2023-02-25 DIAGNOSIS — I471 Supraventricular tachycardia, unspecified: Secondary | ICD-10-CM

## 2023-02-25 NOTE — Telephone Encounter (Signed)
-----   Message from Christell Constant sent at 02/22/2023  6:11 PM EST ----- Results: Asymptomatic SVT Plan: NM Stress test for Flecainide consideration  Christell Constant, MD

## 2023-02-25 NOTE — Telephone Encounter (Signed)
The patient has been notified of the result and verbalized understanding.  All questions (if any) were answered. [name] Burows, RN 02/25/2023 5:03 PM   Instructions for NMST placed in a letter on my chart.  Pt will call or send a my chart message with questions.

## 2023-02-27 NOTE — Addendum Note (Signed)
Addended by: Macie Burows on: 02/27/2023 11:45 AM   Modules accepted: Orders

## 2023-02-28 ENCOUNTER — Other Ambulatory Visit: Payer: Self-pay | Admitting: Internal Medicine

## 2023-02-28 NOTE — Addendum Note (Signed)
Addended by: Riley Lam A on: 02/28/2023 01:37 PM   Modules accepted: Orders

## 2023-03-05 ENCOUNTER — Ambulatory Visit (HOSPITAL_COMMUNITY): Payer: Medicare PPO | Attending: Cardiology

## 2023-03-05 DIAGNOSIS — I700 Atherosclerosis of aorta: Secondary | ICD-10-CM

## 2023-03-05 DIAGNOSIS — I480 Paroxysmal atrial fibrillation: Secondary | ICD-10-CM | POA: Diagnosis not present

## 2023-03-05 DIAGNOSIS — I471 Supraventricular tachycardia, unspecified: Secondary | ICD-10-CM | POA: Diagnosis not present

## 2023-03-05 LAB — MYOCARDIAL PERFUSION IMAGING
LV dias vol: 77 mL (ref 62–150)
LV sys vol: 21 mL
Nuc Stress EF: 73 %
Peak HR: 54 {beats}/min
Rest HR: 54 {beats}/min
Rest Nuclear Isotope Dose: 9.8 mCi
SDS: 0
SRS: 0
SSS: 0
ST Depression (mm): 0 mm
Stress Nuclear Isotope Dose: 31.3 mCi
TID: 1.02

## 2023-03-05 MED ORDER — REGADENOSON 0.4 MG/5ML IV SOLN
0.4000 mg | Freq: Once | INTRAVENOUS | Status: AC
  Administered 2023-03-05: 0.4 mg via INTRAVENOUS

## 2023-03-05 MED ORDER — TECHNETIUM TC 99M TETROFOSMIN IV KIT
31.3000 | PACK | Freq: Once | INTRAVENOUS | Status: AC | PRN
  Administered 2023-03-05: 31.3 via INTRAVENOUS

## 2023-03-05 MED ORDER — TECHNETIUM TC 99M TETROFOSMIN IV KIT
9.8000 | PACK | Freq: Once | INTRAVENOUS | Status: AC | PRN
  Administered 2023-03-05: 9.8 via INTRAVENOUS

## 2023-03-06 ENCOUNTER — Telehealth: Payer: Self-pay | Admitting: Internal Medicine

## 2023-03-06 MED ORDER — FLECAINIDE ACETATE 50 MG PO TABS: 50.0000 mg | ORAL_TABLET | Freq: Two times a day (BID) | ORAL | 3 refills | Status: DC

## 2023-03-06 NOTE — Telephone Encounter (Signed)
The patient has been notified of the result and verbalized understanding.  All questions (if any) were answered. Arvid Right Norabelle Kondo, RN 03/06/2023 12:00 PM   Pt reports will be out of town next week for the holidays.  Spoke with MD advised that pt start medication once back in town. Advised pt of this information.  Pt has agreed to start Flecainide 50 mg PO BID on 03/18/23 and come in for EKG on 03/25/23 at 2 PM for EKG.  Pt had no questions or concerns.

## 2023-03-06 NOTE — Telephone Encounter (Signed)
Patient is requesting to speak with Dr. Izora Ribas or nurse

## 2023-03-06 NOTE — Telephone Encounter (Signed)
-----   Message from Christell Constant sent at 03/05/2023  5:38 PM EST ----- Results: Normal stress test Plan: Flecainide 50 mg PO BID, ECG next week   Christell Constant, MD

## 2023-03-19 ENCOUNTER — Encounter: Payer: Self-pay | Admitting: Internal Medicine

## 2023-03-19 DIAGNOSIS — I480 Paroxysmal atrial fibrillation: Secondary | ICD-10-CM

## 2023-03-19 DIAGNOSIS — I471 Supraventricular tachycardia, unspecified: Secondary | ICD-10-CM

## 2023-03-19 DIAGNOSIS — Z79899 Other long term (current) drug therapy: Secondary | ICD-10-CM

## 2023-03-25 ENCOUNTER — Ambulatory Visit: Payer: Medicare PPO | Attending: Cardiovascular Disease

## 2023-03-25 ENCOUNTER — Other Ambulatory Visit: Payer: Self-pay

## 2023-03-25 VITALS — BP 118/76 | HR 52 | Resp 16 | Ht 70.0 in | Wt 160.8 lb

## 2023-03-25 DIAGNOSIS — I471 Supraventricular tachycardia, unspecified: Secondary | ICD-10-CM | POA: Diagnosis not present

## 2023-03-25 DIAGNOSIS — I480 Paroxysmal atrial fibrillation: Secondary | ICD-10-CM | POA: Diagnosis not present

## 2023-03-25 NOTE — Progress Notes (Signed)
   Nurse Visit   Date of Encounter: 03/25/2023 ID: [name] [name], DOB 07-20-1945, MRN [id number]  PCP:  Ronnald Nian, MD   [facility] Providers Cardiologist:  Christell Constant, MD {   Visit Details   VS:  BP 118/76 (BP Location: Right Arm, Patient Position: Sitting, Cuff Size: Normal)   Pulse (!) 52   Resp 16   Ht 5\' 10"  (1.778 m)   Wt 160 lb 12.8 oz (72.9 kg)   SpO2 99%   BMI 23.07 kg/m  , BMI Body mass index is 23.07 kg/m.  Wt Readings from Last 3 Encounters:  03/25/23 160 lb 12.8 oz (72.9 kg)  03/05/23 159 lb (72.1 kg)  02/06/23 159 lb 9.6 oz (72.4 kg)     Reason for visit: Flecainide start Performed today: Vitals, EKG, Provider consulted:Dr McAlhany, and Education Changes (medications, testing, etc.) : none Length of Visit: 20 minutes    Medications Adjustments/Labs and Tests Ordered: Orders Placed This Encounter  Procedures   EKG 12-Lead   No orders of the defined types were placed in this encounter.  Patient states he has been on the flecainide for 2 weeks now and seems to be doing well with the additional medication. No questions or concerns at this time  Signed, Festus Holts, RN  03/25/2023 2:49 PM

## 2023-04-19 NOTE — Telephone Encounter (Signed)
 Left a message to call back.

## 2023-04-22 ENCOUNTER — Telehealth: Payer: Self-pay | Admitting: Internal Medicine

## 2023-04-22 MED ORDER — PROPAFENONE HCL 150 MG PO TABS: 150.0000 mg | ORAL_TABLET | Freq: Three times a day (TID) | ORAL | 11 refills | Status: DC

## 2023-04-22 NOTE — Telephone Encounter (Signed)
 Called pt advised of MD recommendation: We could stop flecainide  and start propofenone 150 mg TID.  EKG visit in one week with LFTs.  If that still hasn't fixed the issue we can have him see EP for procedural options.   Pt is agreeable to plan all questions order for LFT placed and released for draw.   Pt expresses understanding all questions answered.

## 2023-04-22 NOTE — Telephone Encounter (Signed)
Please see my chart encounter for details. 

## 2023-04-22 NOTE — Telephone Encounter (Signed)
 Patient is returning call from MyChart message. Requesting call back.

## 2023-04-27 ENCOUNTER — Other Ambulatory Visit: Payer: Self-pay | Admitting: Internal Medicine

## 2023-04-27 ENCOUNTER — Other Ambulatory Visit: Payer: Self-pay | Admitting: Family Medicine

## 2023-04-27 DIAGNOSIS — I10 Essential (primary) hypertension: Secondary | ICD-10-CM

## 2023-04-27 DIAGNOSIS — I480 Paroxysmal atrial fibrillation: Secondary | ICD-10-CM

## 2023-04-29 NOTE — Telephone Encounter (Signed)
 Pt currently taking Propafenone; please clarify what meds you would like pt to take.  I have placed a referral to EP.

## 2023-04-29 NOTE — Addendum Note (Signed)
 Addended by: Macie Burows on: 04/29/2023 04:18 PM   Modules accepted: Orders

## 2023-04-29 NOTE — Telephone Encounter (Signed)
 Prescription refill request for Eliquis received. Indication:afib Last office visit:10/24 Scr:1.18  5/24 Age: 78 Weight:72.9  kg  Prescription refilled

## 2023-04-30 ENCOUNTER — Ambulatory Visit: Payer: Medicare PPO | Attending: Cardiology

## 2023-04-30 ENCOUNTER — Other Ambulatory Visit: Payer: Self-pay

## 2023-04-30 VITALS — BP 139/91 | HR 64 | Wt 160.0 lb

## 2023-04-30 DIAGNOSIS — I480 Paroxysmal atrial fibrillation: Secondary | ICD-10-CM

## 2023-04-30 MED ORDER — FLECAINIDE ACETATE 50 MG PO TABS: 50.0000 mg | ORAL_TABLET | Freq: Two times a day (BID) | ORAL | 3 refills | Status: DC

## 2023-04-30 MED ORDER — METOPROLOL TARTRATE 25 MG PO TABS: 25.0000 mg | ORAL_TABLET | Freq: Two times a day (BID) | ORAL | 3 refills | Status: DC

## 2023-04-30 NOTE — Telephone Encounter (Signed)
 Spoke with pt and recommended per Dr Santo stop Propafenone  and start Metoprolol  25mg  - 1 tablet by mouth twice daily as well as resuming Flecainide  50mg  - 1 tablet by mouth bid.  Will confirm with Dr Santo that EKG is still needed and will contact pt.  Pt verbalizes understanding and agrees with current plan.

## 2023-04-30 NOTE — Addendum Note (Signed)
 Addended by: Bertram Millard on: 04/30/2023 11:12 AM   Modules accepted: Orders

## 2023-04-30 NOTE — Patient Instructions (Signed)
 Medication Instructions:  Your physician recommends that you continue on your current medications as directed. Please refer to the Current Medication list given to you today.  *If you need a refill on your cardiac medications before your next appointment, please call your pharmacy*   Lab Work: None ordered.  If you have labs (blood work) drawn today and your tests are completely normal, you will receive your results only by: MyChart Message (if you have MyChart) OR A paper copy in the mail If you have any lab test that is abnormal or we need to change your treatment, we will call you to review the results.   Testing/Procedures: None ordered.    Follow-Up: At [facility], you and your health needs are our priority.  As part of our continuing mission to provide you with exceptional heart care, we have created designated Provider Care Teams.  These Care Teams include your primary Cardiologist (physician) and Advanced Practice Providers (APPs -  Physician Assistants and Nurse Practitioners) who all work together to provide you with the care you need, when you need it.  We recommend signing up for the patient portal called MyChart.  Sign up information is provided on this After Visit Summary.  MyChart is used to connect with patients for Virtual Visits (Telemedicine).  Patients are able to view lab/test results, encounter notes, upcoming appointments, etc.  Non-urgent messages can be sent to your provider as well.   To learn more about what you can do with MyChart, go to [email or web address].    Your next appointment:   Keep appointment as scheduled

## 2023-04-30 NOTE — Addendum Note (Signed)
 Addended by: Bertram Millard on: 04/30/2023 10:55 AM   Modules accepted: Orders

## 2023-04-30 NOTE — Progress Notes (Signed)
   Nurse Visit   Date of Encounter: 04/30/2023 ID: [name] [name], DOB 1946/04/05, MRN [id number]  PCP:  Joyce Norleen BROCKS, MD   [facility] Providers Cardiologist:  Stanly DELENA Leavens, MD      Visit Details   VS:  There were no vitals taken for this visit. , BMI There is no height or weight on file to calculate BMI.  Wt Readings from Last 3 Encounters:  03/25/23 160 lb 12.8 oz (72.9 kg)  03/05/23 159 lb (72.1 kg)  02/06/23 159 lb 9.6 oz (72.4 kg)     Reason for visit: EKG for Propafenone  Performed today: Vitals, EKG and consult with Dr Shlomo, DOD Changes (medications, testing, etc.) : none Length of Visit: 30 minutes    Medications Adjustments/Labs and Tests Ordered: Orders Placed This Encounter  Procedures   EKG 12-Lead   No orders of the defined types were placed in this encounter. Pt presents to day at the request of Dr Leavens for EKG.  Pt was previously prescribed Flecainide  50mg  bid and Diltiazem  CD 120mg  daily  He developed nasal congestion and loss of taste.  He was then changed to Propafenone  150mg  q8hr with no change in symptoms.  This morning Dr Leavens requested pt be changed back to Flecainide  50mg  - 1 tablet by mouth bid and Metoprolol  Tart 25mg  bid.  Pt is aware of changes made prior to appointment today and is in agreement with these changes.  Pt reports he is still having nasal congestion and loss of taste and smell.  Recommended pt follow up with his PCP regarding these symptoms.   Signed, Aldona FORBES Marina, RN  04/30/2023 3:04 PM

## 2023-04-30 NOTE — Telephone Encounter (Signed)
 I spoke with the pt and he will also restart the Flecainide  at his previous dosing.. I will place an ASAP referral to the Afib clinic.. I updated his med list. He has stopped the Diltiazem .   He will come in for his ECG today as planned.   Per Dr Santo:   Unfortunately.  We need three things, in order of importance.  1. To know exactly what he is taking (he has tried different regimens to deal with his congestion). 2. EKG to make sure intervals are ok.  3. A fib clin f/u so that we can make sure he doesn't slip through the cracks

## 2023-05-01 ENCOUNTER — Encounter: Payer: Self-pay | Admitting: Family Medicine

## 2023-05-01 ENCOUNTER — Ambulatory Visit: Payer: Medicare PPO | Admitting: Family Medicine

## 2023-05-01 VITALS — BP 112/68 | HR 43 | Temp 98.0°F | Wt 160.2 lb

## 2023-05-01 DIAGNOSIS — I4891 Unspecified atrial fibrillation: Secondary | ICD-10-CM

## 2023-05-01 DIAGNOSIS — R0981 Nasal congestion: Secondary | ICD-10-CM

## 2023-05-01 DIAGNOSIS — R430 Anosmia: Secondary | ICD-10-CM | POA: Diagnosis not present

## 2023-05-01 NOTE — Progress Notes (Signed)
   Subjective:    Patient ID: [name] [name], male    DOB: 07-24-1945, 78 y.o.   MRN: [id number]  HPI He states for the last 2 months he has had difficulty with nasal congestion and loss of smell and taste.  Around the same timeframe he has been treated for atrial fibs and was tried on diltiazem  and then switched to flecainide .  The nasal congestion has continued in spite of stopping the diltiazem .  He has no underlying history of allergies.  No fever, chills, purulent discharge, PND.  Also of note is the fact that at the time that he had this he had no viral related symptoms.   Review of Systems     Objective:    Physical Exam Alert and in no distress.  Nasal mucosa appears normal tympanic membranes and canals are normal. Pharyngeal area is normal. Neck is supple without adenopathy or thyromegaly. Cardiac exam shows a regular sinus rhythm without murmurs or gallops. Lungs are clear to auscultation.        Assessment & Plan:  Nasal congestion  Anosmia  Atrial fibrillation, unspecified type (HCC) The etiology of this is unclear.  I do not think this is COVID-related since he had no COVID related symptoms when this all started 6.  He does tend to relate this to the new meds for his atrial fibs. I will have him use Rhinocort nasal spray regularly for the next 3 to 4 weeks and if continued difficulty, certainly referral to ENT is reasonable.  He was comfortable with that.

## 2023-05-01 NOTE — Patient Instructions (Signed)
 Use Rhinocort nasal spray regularly for the next 3 weeks to see what it we will do

## 2023-05-02 ENCOUNTER — Encounter: Payer: Self-pay | Admitting: Cardiovascular Disease

## 2023-05-02 ENCOUNTER — Ambulatory Visit: Payer: Medicare PPO | Attending: Cardiovascular Disease | Admitting: Cardiovascular Disease

## 2023-05-02 VITALS — BP 130/84 | HR 40 | Ht 70.0 in | Wt 163.6 lb

## 2023-05-02 DIAGNOSIS — I471 Supraventricular tachycardia, unspecified: Secondary | ICD-10-CM

## 2023-05-02 DIAGNOSIS — I482 Chronic atrial fibrillation, unspecified: Secondary | ICD-10-CM | POA: Diagnosis not present

## 2023-05-02 MED ORDER — DRONEDARONE HCL 400 MG PO TABS: 400.0000 mg | ORAL_TABLET | Freq: Two times a day (BID) | ORAL | 3 refills | Status: DC

## 2023-05-02 NOTE — Progress Notes (Signed)
Electrophysiology Office Note:    Date:  05/02/2023   ID:  [name] [name], DOB May 26, 1945, MRN [id number]  PCP:  [name] Nian, MD   [facility] Providers Cardiologist:  [name] Constant, MD     Referring MD: [name] Lam A*   History of Present Illness:    [name] [name] is a 78 y.o. male with a medical history significant for PAF, CAD (CA calcification and atherosclerosis), referred for arrhythmia management.     I discussed the use of AI scribe software for clinical note transcription with the patient, who gave verbal consent to proceed.   The patient's atrial fibrillation was first diagnosed following an episode in [place], [place], about 10 years ago after an evening of increased alcohol consumption. Since then, the patient has had multiple episodes, often triggered by alcohol, leading to a reduction in alcohol intake. However, in the past year, the patient has experienced episodes of atrial flutter without any obvious triggers, leading to two ER visits in May and November.  He was started on flecainide 50 mg twice daily and metoprolol 25 mg twice daily.  Additionally, he has Cardizem 30 mg as needed.  The onset of congestion and loss of taste and smell coincided with the initiation of flecainide, but the patient is unsure if these symptoms are related to the medication.     Today, He feels well. His heart rate is slow (40 bpm).  He reports that he has low heart rates at baseline and has not been having dizziness, presyncope, increased fatigue.  EKGs/Labs/Other Studies Reviewed Today:     Echocardiogram:  TTE 07/11/2020 EF 65 to 70%.  Normal biatrial size.  No gross valvular abnormalities   Monitors:  7-day ZIO monitor October 2024 -- my interpretation Sinus rhythm heart rate 35 to 128 bpm, average 54 bpm No AF detected Episodes of SVT occurred consistent with atrial runs, longest 5s 1.7% burden of atrial ectopy No symptom episodes reported      Event monitor 06/2020      Few, brief tracings available -- some likely show AF; there are little data to draw any substantive conclusions.  Stress testing:  Nuclear stress November 2024 Normal study, low risk; normal perfusion  Advanced imaging:   Cardiac catherization   EKG:   EKG Interpretation Date/Time:  Thursday May 02 2023 09:14:40 EST Ventricular Rate:  40 PR Interval:  242 QRS Duration:  92 QT Interval:  438 QTC Calculation: 356 R Axis:   88  Text Interpretation: Marked sinus bradycardia with 1st degree A-V block When compared with ECG of 30-Apr-2023 14:40, Vent. rate has decreased BY  22 BPM QT has shortened Confirmed by [name] [name] [phone]) on 05/02/2023 9:30:09 AM     Physical Exam:    VS:  BP 130/84 (BP Location: Left Arm, Patient Position: Sitting, Cuff Size: Normal)   Pulse (!) 40   Ht 5\' 10"  (1.778 m)   Wt 163 lb 9.6 oz (74.2 kg)   SpO2 98%   BMI 23.47 kg/m     Wt Readings from Last 3 Encounters:  05/02/23 163 lb 9.6 oz (74.2 kg)  05/01/23 160 lb 3.2 oz (72.7 kg)  04/30/23 160 lb (72.6 kg)     GEN: Well nourished, well developed in no acute distress CARDIAC: RRR, no murmurs, rubs, gallops RESPIRATORY:  Normal work of breathing MUSCULOSKELETAL: no edema    ASSESSMENT & PLAN:     Paroxysmal atrial fibrillation Per history, diagnosis dates back about 10 years Strips  from 2022 event monitor are very limited but do appear to document AF We discussed management options, and using a shared decision approach decided to proceed with AF ablation Will stop flecainide today given possible side effects as well as history of coronary disease, bradycardia on metoprolol DC metoprolol; will try dronedarone if affordable.  If not we will use amiodarone short-term until ablation.  We discussed the indication, rationale, logistics, anticipated benefits, and potential risks of the ablation procedure including but not limited to -- bleed at the groin  access site, chest pain, damage to nearby organs such as the diaphragm, lungs, or esophagus, need for a drainage tube, or prolonged hospitalization. I explained that the risk for stroke, heart attack, need for open chest surgery, or even death is very low but not zero. he  expressed understanding and wishes to proceed.   Atrial flutter ECGs 11/14, 2023 and 5/18 2024 appear most likely to be atrial flutter with 2:1 conduction Precipitated ER visits We will plan for CTI line at the time of AF ablation  Secondary hypercoagulable state CHA2DS2-VASc score is 3 Continue Eliquis 5 mg    Signed, [name] Small, MD  05/02/2023 9:30 AM    [facility]

## 2023-05-02 NOTE — Patient Instructions (Signed)
Medication Instructions:  STOP Flecainide STOP Metoprolol START Multaq (dronedarone) 400 mg twice daily - start 3 days after stopping flecainide *If you need a refill on your cardiac medications before your next appointment, please call your pharmacy*   Testing/Procedures: Cardiac CT - someone from the hospital will contact you to set this up Your physician has requested that you have cardiac CT. Cardiac computed tomography (CT) is a painless test that uses an x-ray machine to take clear, detailed pictures of your heart. For further information please visit [email or web address] Please follow instruction sheet as given.   Atrial Fibrillation/Flutter Ablation Your physician has recommended that you have an ablation. Catheter ablation is a medical procedure used to treat some cardiac arrhythmias (irregular heartbeats). During catheter ablation, a long, thin, flexible tube is put into a blood vessel in your groin (upper thigh), or neck. This tube is called an ablation catheter. It is then guided to your heart through the blood vessel. Radio frequency waves destroy small areas of heart tissue where abnormal heartbeats may cause an arrhythmia to start. Please see the instruction sheet given to you today.  You are scheduled for Atrial Fibrillation Ablation on Wednesday, March 19 with Dr. Halford Chessman.Please arrive at the Main Entrance A at [facility]: [address] [place], [place] [address]   Follow-Up: At [facility], you and your health needs are our priority.  As part of our continuing mission to provide you with exceptional heart care, we have created designated Provider Care Teams.  These Care Teams include your primary Cardiologist (physician) and Advanced Practice Providers (APPs -  Physician Assistants and Nurse Practitioners) who all work together to provide you with the care you need, when you need it.  We recommend signing up for the patient portal called "MyChart".  Sign  up information is provided on this After Visit Summary.  MyChart is used to connect with patients for Virtual Visits (Telemedicine).  Patients are able to view lab/test results, encounter notes, upcoming appointments, etc.  Non-urgent messages can be sent to your provider as well.   To learn more about what you can do with MyChart, go to [email or web address].    Your next appointment:   Jun 07, 2023 at 10:15 am  Provider:   York Pellant, MD

## 2023-05-14 ENCOUNTER — Encounter: Payer: Medicare PPO | Admitting: Family Medicine

## 2023-05-14 ENCOUNTER — Ambulatory Visit: Payer: Medicare PPO

## 2023-05-14 DIAGNOSIS — Z0000 Encounter for general adult medical examination without abnormal findings: Secondary | ICD-10-CM

## 2023-05-14 NOTE — Patient Instructions (Signed)
[name] , Thank you for taking time to come for your Medicare Wellness Visit. I appreciate your ongoing commitment to your health goals. Please review the following plan we discussed and let me know if I can assist you in the future.   Referrals/Orders/Follow-Ups/Clinician Recommendations: none  This is a list of the screening recommended for you and due dates:  Health Maintenance  Topic Date Due   COVID-19 Vaccine (7 - 2024-25 season) 03/12/2023   Medicare Annual Wellness Visit  05/13/2024   DTaP/Tdap/Td vaccine (3 - Td or Tdap) 07/11/2031   Flu Shot  Completed   Hepatitis C Screening  Completed   Zoster (Shingles) Vaccine  Completed   HPV Vaccine  Aged Out   Pneumonia Vaccine  Discontinued   Colon Cancer Screening  Discontinued    Advanced directives: (Copy Requested) Please bring a copy of your health care power of attorney and living will to the office to be added to your chart at your convenience.  Next Medicare Annual Wellness Visit scheduled for next year: Yes  insert Preventive Care attachment Insert FALL PREVENTION attachment if needed

## 2023-05-14 NOTE — Progress Notes (Signed)
Subjective:   [name] [name] is a 78 y.o. male who presents for Medicare Annual/Subsequent preventive examination.  Visit Complete: Virtual I connected with  Gatha Mayer on 05/14/23 by a audio enabled telemedicine application and verified that I am speaking with the correct person using two identifiers.  Interactive audio and video telecommunications were attempted between this provider and patient, however failed, due to patient having technical difficulties OR patient did not have access to video capability.  We continued and completed visit with audio only.  Patient Location: Home  Provider Location: Office/Clinic  I discussed the limitations of evaluation and management by telemedicine. The patient expressed understanding and agreed to proceed.  Vital Signs: Because this visit was a virtual/telehealth visit, some criteria may be missing or patient reported. Any vitals not documented were not able to be obtained and vitals that have been documented are patient reported.  Patient Medicare AWV questionnaire was completed by the patient on 05/10/2023; I have confirmed that all information answered by patient is correct and no changes since this date.  Cardiac Risk Factors include: advanced age (>73men, >44 women);hypertension;male gender     Objective:    Today's Vitals   There is no height or weight on file to calculate BMI.     05/14/2023    3:31 PM 09/01/2022   12:31 PM 05/08/2022    1:58 PM 05/08/2022    1:52 PM 05/03/2021    9:22 AM 05/24/2020   12:06 PM 12/23/2019    1:49 PM  Advanced Directives  Does Patient Have a Medical Advance Directive? Yes Yes Yes Yes Yes Yes Yes  Type of Advance Directive Living will;Healthcare Power of [employer] Power of [place];Living will Healthcare Power of [place];Living will Living will;Healthcare Power of [employer] Power of [place];Living will Healthcare Power of [place];Living will Living will  Does patient want to make  changes to medical advance directive?   Yes (Inpatient - patient defers changing a medical advance directive at this time - Information given)  No - Patient declined    Copy of Healthcare Power of Attorney in Chart? No - copy requested  No - copy requested  No - copy requested      Current Medications (verified) Outpatient Encounter Medications as of 05/14/2023  Medication Sig   clonazePAM (KLONOPIN) 0.5 MG tablet Take 0.5 mg by mouth as needed for anxiety.   diltiazem (CARDIZEM) 30 MG tablet TAKE 1 TABLET BY MOUTH FOUR TIMES DAILY AS NEEDED FOR HEART RATE GREATER THAN 110   dronedarone (MULTAQ) 400 MG tablet Take 1 tablet (400 mg total) by mouth 2 (two) times daily with a meal. START on Monday, January 20   ELIQUIS 5 MG TABS tablet TAKE 1 TABLET(5 MG) BY MOUTH TWICE DAILY   Fluvoxamine Maleate 100 MG CP24 TAKE 2 CAPSULES BY MOUTH EVERY DAY   ibuprofen (ADVIL,MOTRIN) 200 MG tablet Take 400 mg by mouth every 6 (six) hours as needed for headache or moderate pain (pain score 4-6).   losartan-hydrochlorothiazide (HYZAAR) 50-12.5 MG tablet TAKE 1 TABLET BY MOUTH DAILY   Multiple Vitamins-Minerals (MULTIVITAMIN WITH MINERALS) tablet Take 1 tablet by mouth daily.   rosuvastatin (CRESTOR) 10 MG tablet TAKE 1 TABLET(10 MG) BY MOUTH DAILY   zolpidem (AMBIEN CR) 6.25 MG CR tablet Take 1 tablet (6.25 mg total) by mouth at bedtime.   No facility-administered encounter medications on file as of 05/14/2023.    Allergies (verified) Patient has no known allergies.   History: Past Medical History:  Diagnosis Date   Adenomatous polyp of colon 11/13/2017   Anxiety    Depression    Diverticulosis of colon    History of atrial fibrillation 07/28/2012   History of atrial fibrillation without current medication    episode 2014 and 2015 in [place], [place] ([facility]     History of esophageal dilatation    History of esophageal stricture  04/01/2013   History of kidney stones    History of kidney stones    Hyperlipidemia    mild   Hyperlipidemia with target LDL less than 100 07/04/2011   Hypertension    Left ureteral stone    Obsessive compulsive disorder    OCD (obsessive compulsive disorder) 07/04/2011   PAF (paroxysmal atrial fibrillation) (HCC) 05/26/2020   Prostate cancer (HCC) UROLOGIST-  DR DALHSTEDT   Gleason 3+4,  PSA 3.93, vol 25.2cc--  s/p  prostatectomy 12-24-2013   Past Surgical History:  Procedure Laterality Date   COLONOSCOPY  2009   Dr. Arlyce Dice   HERNIA REPAIR  as child   LYMPHADENECTOMY Bilateral 12/24/2013   Procedure: LYMPHADENECTOMY;  Surgeon: Heloise Purpura, MD;  Location: [facility];  Service: Urology;  Laterality: Bilateral;   ROBOT ASSISTED LAPAROSCOPIC RADICAL PROSTATECTOMY N/A 12/24/2013   Procedure: ROBOTIC ASSISTED LAPAROSCOPIC RADICAL PROSTATECTOMY LEVEL 2;  Surgeon: Heloise Purpura, MD;  Location: [facility];  Service: Urology;  Laterality: N/A;   TONSILLECTOMY     WISDOM TOOTH EXTRACTION     Family History  Problem Relation Age of Onset   Bladder Cancer Father    Colon cancer Neg Hx    Esophageal cancer Neg Hx    Stomach cancer Neg Hx    Colon polyps Neg Hx    Rectal cancer Neg Hx    Social History   Socioeconomic History   Marital status: Single    Spouse name: Not on file   Number of children: Not on file   Years of education: Not on file   Highest education level: Not on file  Occupational History   Not on file  Tobacco Use   Smoking status: Former    Current packs/day: 0.00    Types: Cigarettes    Quit date: 04/16/2005    Years since quitting: 18.0   Smokeless tobacco: Never  Vaping Use   Vaping status: Never Used  Substance and Sexual Activity   Alcohol use: Yes    Alcohol/week: 14.0 standard drinks of alcohol    Types: 14 Glasses of wine per week    Comment: 2glasses of wine per night per pt   Drug use: No   Sexual activity: Not Currently  Other Topics Concern   Not on  file  Social History Narrative   Not on file   Social Drivers of Health   Financial Resource Strain: Low Risk  (05/14/2023)   Overall Financial Resource Strain (CARDIA)    Difficulty of Paying Living Expenses: Not hard at all  Food Insecurity: No Food Insecurity (05/14/2023)   Hunger Vital Sign    Worried About Running Out of Food in the Last Year: Never true    Ran Out of Food in the Last Year: Never true  Transportation Needs: No Transportation Needs (05/14/2023)   PRAPARE - Administrator, Civil Service (Medical): No    Lack of Transportation (Non-Medical): No  Physical Activity: Sufficiently Active (05/14/2023)   Exercise Vital Sign    Days of Exercise per Week: 5  days    Minutes of Exercise per Session: 30 min  Stress: Stress Concern Present (05/14/2023)   [employer] of Occupational Health - Occupational Stress Questionnaire    Feeling of Stress : To some extent  Social Connections: Socially Isolated (05/14/2023)   Social Connection and Isolation Panel [NHANES]    Frequency of Communication with Friends and Family: Three times a week    Frequency of Social Gatherings with Friends and Family: Three times a week    Attends Religious Services: Never    Active Member of Clubs or Organizations: No    Attends Engineer, structural: Never    Marital Status: Never married    Tobacco Counseling Counseling given: Not Answered   Clinical Intake:  Pre-visit preparation completed: Yes  Pain : No/denies pain     Nutritional Risks: None Diabetes: No  How often do you need to have someone help you when you read instructions, pamphlets, or other written materials from your doctor or pharmacy?: 1 - Never  Interpreter Needed?: No  Information entered by :: NAllen LPN   Activities of Daily Living    05/10/2023   12:49 PM  In your present state of health, do you have any difficulty performing the following activities:  Hearing? 0  Vision? 0   Difficulty concentrating or making decisions? 0  Walking or climbing stairs? 0  Dressing or bathing? 0  Doing errands, shopping? 0  Preparing Food and eating ? N  Using the Toilet? N  In the past six months, have you accidently leaked urine? N  Do you have problems with loss of bowel control? N  Managing your Medications? N  Managing your Finances? N  Housekeeping or managing your Housekeeping? N    Patient Care Team: Ronnald Nian, MD as PCP - General (Family Medicine) Christell Constant, MD as PCP - Cardiology (Cardiology) Mealor, Roberts Gaudy, MD as PCP - Electrophysiology (Cardiology)  Indicate any recent Medical Services you may have received from other than [facility] providers in the past year (date may be approximate).     Assessment:   This is a routine wellness examination for Adonay.  Hearing/Vision screen Hearing Screening - Comments:: Denies hearing issues  Vision Screening - Comments:: Regular eye exams, [facility]   Goals Addressed             This Visit's Progress    Patient Stated       05/14/2023, denies goals       Depression Screen    05/14/2023    3:32 PM 05/08/2022    1:46 PM 05/03/2021    9:24 AM 12/23/2019    1:51 PM 12/05/2018    8:33 AM 11/13/2017    1:41 PM 11/08/2016    1:56 PM  PHQ 2/9 Scores  PHQ - 2 Score 1 0 0 0 0 0 0    Fall Risk    05/10/2023   12:49 PM 05/08/2022    1:40 PM 05/03/2021    9:23 AM 12/23/2019    1:51 PM 12/05/2018    8:33 AM  Fall Risk   Falls in the past year? 0 0 1 0 0  Number falls in past yr: 0 0 0    Injury with Fall? 0 1 0    Risk for fall due to : Medication side effect No Fall Risks No Fall Risks    Follow up Falls prevention discussed;Falls evaluation completed Education provided;Falls prevention discussed Falls evaluation completed  MEDICARE RISK AT HOME: Medicare Risk at Home Any stairs in or around the home?: (Patient-Rptd) Yes If so, are there any without handrails?: (Patient-Rptd)  No Home free of loose throw rugs in walkways, pet beds, electrical cords, etc?: (Patient-Rptd) Yes Adequate lighting in your home to reduce risk of falls?: (Patient-Rptd) Yes Life alert?: (Patient-Rptd) No Use of a cane, walker or w/c?: (Patient-Rptd) No Grab bars in the bathroom?: (Patient-Rptd) No Shower chair or bench in shower?: (Patient-Rptd) No Elevated toilet seat or a handicapped toilet?: (Patient-Rptd) No  TIMED UP AND GO:  Was the test performed?  No    Cognitive Function:        05/14/2023    3:33 PM 05/08/2022    1:53 PM  6CIT Screen  What Year? 0 points 0 points  What month? 0 points 0 points  What time? 0 points 0 points  Count back from 20 0 points 0 points  Months in reverse 2 points 0 points  Repeat phrase 0 points 0 points  Total Score 2 points 0 points    Immunizations Immunization History  Administered Date(s) Administered   Fluad Quad(high Dose 65+) 12/05/2018, 12/23/2019   Influenza Split 02/11/2009   Influenza, High Dose Seasonal PF 12/10/2016, 03/22/2018, 01/28/2022   Influenza,inj,Quad PF,6+ Mos 12/25/2013   Influenza-Unspecified 01/15/2021, 01/15/2023   PFIZER Comirnaty(Gray Top)Covid-19 Tri-Sucrose Vaccine 07/24/2020   PFIZER(Purple Top)SARS-COV-2 Vaccination 06/13/2019, 07/03/2019, 01/18/2020   Pfizer Covid-19 Vaccine Bivalent Booster 75yrs & up 01/25/2021   Pfizer(Comirnaty)Fall Seasonal Vaccine 12 years and older 01/15/2023   Pneumococcal Conjugate-13 09/27/2015   Pneumococcal Polysaccharide-23 05/06/2008   Tdap 05/06/2008, 07/10/2021   Zoster Recombinant(Shingrix) 03/22/2018, 06/09/2018   Zoster, Live 05/06/2008    TDAP status: Due, Education has been provided regarding the importance of this vaccine. Advised may receive this vaccine at local pharmacy or Health Dept. Aware to provide a copy of the vaccination record if obtained from local pharmacy or Health Dept. Verbalized acceptance and understanding.  Flu Vaccine status: Up to  date  Pneumococcal vaccine status: Up to date  Covid-19 vaccine status: Completed vaccines  Qualifies for Shingles Vaccine? Yes   Zostavax completed Yes   Shingrix Completed?: Yes  Screening Tests Health Maintenance  Topic Date Due   COVID-19 Vaccine (7 - 2024-25 season) 03/12/2023   Medicare Annual Wellness (AWV)  05/13/2024   DTaP/Tdap/Td (3 - Td or Tdap) 07/11/2031   INFLUENZA VACCINE  Completed   Hepatitis C Screening  Completed   Zoster Vaccines- Shingrix  Completed   HPV VACCINES  Aged Out   Pneumonia Vaccine 59+ Years old  Discontinued   Colonoscopy  Discontinued    Health Maintenance  Health Maintenance Due  Topic Date Due   COVID-19 Vaccine (7 - 2024-25 season) 03/12/2023    Colorectal cancer screening: No longer required.   Lung Cancer Screening: (Low Dose CT Chest recommended if Age 41-80 years, 20 pack-year currently smoking OR have quit w/in 15years.) does not qualify.   Lung Cancer Screening Referral: no  Additional Screening:  Hepatitis C Screening: does qualify; Completed 07/30/2013  Vision Screening: Recommended annual ophthalmology exams for early detection of glaucoma and other disorders of the eye. Is the patient up to date with their annual eye exam?  Yes  Who is the provider or what is the name of the office in which the patient attends annual eye exams? [facility] If pt is not established with a provider, would they like to be referred to a provider to establish care? No .  Dental Screening: Recommended annual dental exams for proper oral hygiene  Diabetic Foot Exam: n/a  Community Resource Referral / Chronic Care Management: CRR required this visit?  No   CCM required this visit?  No     Plan:     I have personally reviewed and noted the following in the patient's chart:   Medical and social history Use of alcohol, tobacco or illicit drugs  Current medications and supplements including opioid prescriptions. Patient is not  currently taking opioid prescriptions. Functional ability and status Nutritional status Physical activity Advanced directives List of other physicians Hospitalizations, surgeries, and ER visits in previous 12 months Vitals Screenings to include cognitive, depression, and falls Referrals and appointments  In addition, I have reviewed and discussed with patient certain preventive protocols, quality metrics, and best practice recommendations. A written personalized care plan for preventive services as well as general preventive health recommendations were provided to patient.     Barb Merino, LPN   05/30/863   After Visit Summary: (MyChart) Due to this being a telephonic visit, the after visit summary with patients personalized plan was offered to patient via MyChart   Nurse Notes: none

## 2023-05-15 ENCOUNTER — Other Ambulatory Visit: Payer: Self-pay

## 2023-05-15 DIAGNOSIS — J301 Allergic rhinitis due to pollen: Secondary | ICD-10-CM

## 2023-05-22 ENCOUNTER — Ambulatory Visit: Payer: Medicare PPO | Admitting: Family Medicine

## 2023-05-27 DIAGNOSIS — R430 Anosmia: Secondary | ICD-10-CM | POA: Diagnosis not present

## 2023-05-27 DIAGNOSIS — R432 Parageusia: Secondary | ICD-10-CM | POA: Diagnosis not present

## 2023-05-30 ENCOUNTER — Encounter: Payer: Self-pay | Admitting: Family Medicine

## 2023-05-30 ENCOUNTER — Ambulatory Visit: Payer: Medicare PPO | Admitting: Family Medicine

## 2023-05-30 VITALS — BP 110/70 | HR 67 | Ht 70.0 in | Wt 161.2 lb

## 2023-05-30 DIAGNOSIS — I4819 Other persistent atrial fibrillation: Secondary | ICD-10-CM | POA: Diagnosis not present

## 2023-05-30 DIAGNOSIS — Z23 Encounter for immunization: Secondary | ICD-10-CM | POA: Diagnosis not present

## 2023-05-30 DIAGNOSIS — Z8546 Personal history of malignant neoplasm of prostate: Secondary | ICD-10-CM

## 2023-05-30 DIAGNOSIS — Z87442 Personal history of urinary calculi: Secondary | ICD-10-CM

## 2023-05-30 DIAGNOSIS — Z0000 Encounter for general adult medical examination without abnormal findings: Secondary | ICD-10-CM

## 2023-05-30 DIAGNOSIS — F419 Anxiety disorder, unspecified: Secondary | ICD-10-CM | POA: Diagnosis not present

## 2023-05-30 DIAGNOSIS — J301 Allergic rhinitis due to pollen: Secondary | ICD-10-CM

## 2023-05-30 DIAGNOSIS — I10 Essential (primary) hypertension: Secondary | ICD-10-CM

## 2023-05-30 DIAGNOSIS — E782 Mixed hyperlipidemia: Secondary | ICD-10-CM

## 2023-05-30 DIAGNOSIS — I700 Atherosclerosis of aorta: Secondary | ICD-10-CM | POA: Diagnosis not present

## 2023-05-30 DIAGNOSIS — Z8719 Personal history of other diseases of the digestive system: Secondary | ICD-10-CM

## 2023-05-30 DIAGNOSIS — Z7901 Long term (current) use of anticoagulants: Secondary | ICD-10-CM | POA: Diagnosis not present

## 2023-05-30 DIAGNOSIS — Z860100 Personal history of colon polyps, unspecified: Secondary | ICD-10-CM | POA: Diagnosis not present

## 2023-05-30 DIAGNOSIS — F429 Obsessive-compulsive disorder, unspecified: Secondary | ICD-10-CM

## 2023-05-30 LAB — LIPID PANEL

## 2023-05-30 MED ORDER — ROSUVASTATIN CALCIUM 10 MG PO TABS
10.0000 mg | ORAL_TABLET | Freq: Every day | ORAL | 3 refills | Status: DC
Start: 2023-05-30 — End: 2023-11-12

## 2023-05-30 MED ORDER — DILTIAZEM HCL 30 MG PO TABS: 30.0000 mg | ORAL_TABLET | Freq: Every day | ORAL | 3 refills | Status: AC

## 2023-05-30 MED ORDER — FLUVOXAMINE MALEATE ER 100 MG PO CP24: 2.0000 | ORAL_CAPSULE | Freq: Every day | ORAL | 3 refills | Status: AC

## 2023-05-30 MED ORDER — LOSARTAN POTASSIUM-HCTZ 50-12.5 MG PO TABS: 1.0000 | ORAL_TABLET | Freq: Every day | ORAL | 3 refills | Status: AC

## 2023-05-30 NOTE — Progress Notes (Signed)
   Subjective:    Patient ID: [name] [name], male    DOB: [birth year], 78 y.o.   MRN: [id number]  HPI He is here for complete examination.  He continues have difficulty with decreased smell and taste and was scheduled to see an ENT but rescheduled with 1 at a better time.  They gave him a different steroid and have him scheduled for a CT scan.  He also has underlying A-fib and apparently is scheduled for an ablation at sometime in the near future.  He continues on Multaq as well as Eliquis.  He is also taking Crestor and losartan/HCTZ.  The Luvox continues to help with his OCD.  He does occasionally take Klonopin.  He does have a previous history of colonic polyps however the last colonoscopy was negative.  He has not had any difficulty with swallowing.  Has a remote history of prostate cancer as well as kidney stone.  In general life is going fairly well except for his underlying cardiac issues and smell and taste problems.   Review of Systems  All other systems reviewed and are negative. Otherwise family and social history as well as health maintenance and immunizations was reviewed     Objective:    Physical Exam Alert and in no distress. Tympanic membranes and canals are normal. Pharyngeal area is normal. Neck is supple without adenopathy or thyromegaly. Cardiac exam shows a regular  rhythm without murmurs or gallops. Lungs are clear to auscultation.        Assessment & Plan:  Routine general medical examination at a health care facility  Seasonal allergic rhinitis due to pollen  Anxiety  Aortic atherosclerosis (HCC) - Plan: Lipid panel  Persistent atrial fibrillation (HCC) - Plan: CBC with Differential/Platelet, Comprehensive metabolic panel  Chronic anticoagulation  Essential hypertension - Plan: losartan-hydrochlorothiazide (HYZAAR) 50-12.5 MG tablet, diltiazem (CARDIZEM) 30 MG tablet  History of colonic polyps  History of esophageal stricture  History of prostate  cancer  Obsessive-compulsive disorder, unspecified type - Plan: Fluvoxamine Maleate 100 MG CP24  History of renal stone  Mixed hyperlipidemia - Plan: rosuvastatin (CRESTOR) 10 MG tablet  Need for vaccination against Streptococcus pneumoniae - Plan: Pneumococcal conjugate vaccine 20-valent (Prevnar 20) He will follow-up with ENT as already scheduled.  Otherwise continue on his present medication.

## 2023-05-31 ENCOUNTER — Encounter: Payer: Self-pay | Admitting: Family Medicine

## 2023-05-31 LAB — COMPREHENSIVE METABOLIC PANEL
ALT: 23 IU/L (ref 0–44)
AST: 21 IU/L (ref 0–40)
Albumin: 4.5 g/dL (ref 3.8–4.8)
Alkaline Phosphatase: 106 IU/L (ref 44–121)
BUN/Creatinine Ratio: 18 (ref 10–24)
BUN: 22 mg/dL (ref 8–27)
Bilirubin Total: 0.5 mg/dL (ref 0.0–1.2)
CO2: 24 mmol/L (ref 20–29)
Calcium: 9.8 mg/dL (ref 8.6–10.2)
Chloride: 102 mmol/L (ref 96–106)
Creatinine, Ser: 1.24 mg/dL (ref 0.76–1.27)
Globulin, Total: 2 g/dL (ref 1.5–4.5)
Glucose: 81 mg/dL (ref 70–99)
Potassium: 4 mmol/L (ref 3.5–5.2)
Sodium: 140 mmol/L (ref 134–144)
Total Protein: 6.5 g/dL (ref 6.0–8.5)
eGFR: 61 mL/min/{1.73_m2} (ref >59)

## 2023-05-31 LAB — CBC WITH DIFFERENTIAL/PLATELET
Basophils Absolute: 0 10*3/uL (ref 0.0–0.2)
Basos: 1 %
EOS (ABSOLUTE): 0.2 10*3/uL (ref 0.0–0.4)
Eos: 3 %
Hematocrit: 46.9 % (ref 37.5–51.0)
Hemoglobin: 15.9 g/dL (ref 13.0–17.7)
Immature Grans (Abs): 0 10*3/uL (ref 0.0–0.1)
Immature Granulocytes: 0 %
Lymphocytes Absolute: 1.5 10*3/uL (ref 0.7–3.1)
Lymphs: 25 %
MCH: 31.7 pg (ref 26.6–33.0)
MCHC: 33.9 g/dL (ref 31.5–35.7)
MCV: 94 fL (ref 79–97)
Monocytes Absolute: 0.6 10*3/uL (ref 0.1–0.9)
Monocytes: 10 %
Neutrophils Absolute: 3.6 10*3/uL (ref 1.4–7.0)
Neutrophils: 61 %
Platelets: 173 10*3/uL (ref 150–450)
RBC: 5.01 x10E6/uL (ref 4.14–5.80)
RDW: 12.6 % (ref 11.6–15.4)
WBC: 5.9 10*3/uL (ref 3.4–10.8)

## 2023-05-31 LAB — LIPID PANEL
Cholesterol, Total: 161 mg/dL (ref 100–199)
HDL: 80 mg/dL (ref >39)
LDL CALC COMMENT:: 2 ratio (ref 0.0–5.0)
LDL Chol Calc (NIH): 65 mg/dL (ref 0–99)
Triglycerides: 90 mg/dL (ref 0–149)
VLDL Cholesterol Cal: 16 mg/dL (ref 5–40)

## 2023-06-05 ENCOUNTER — Telehealth (HOSPITAL_COMMUNITY): Payer: Self-pay

## 2023-06-05 DIAGNOSIS — H26492 Other secondary cataract, left eye: Secondary | ICD-10-CM | POA: Diagnosis not present

## 2023-06-05 DIAGNOSIS — H35373 Puckering of macula, bilateral: Secondary | ICD-10-CM | POA: Diagnosis not present

## 2023-06-05 DIAGNOSIS — H35363 Drusen (degenerative) of macula, bilateral: Secondary | ICD-10-CM | POA: Diagnosis not present

## 2023-06-05 DIAGNOSIS — H353131 Nonexudative age-related macular degeneration, bilateral, early dry stage: Secondary | ICD-10-CM | POA: Diagnosis not present

## 2023-06-05 NOTE — Telephone Encounter (Signed)
 Spoke with patient to complete one month pre-procedure call.     New medical conditions? No Recent hospitalizations or surgeries? No Started any new medications? Xhance nasal spray for nasal congestion. Patient made aware to contact office to inform of any new medications started. Any changes in activities of daily living? No  Pre-procedure testing scheduled: CT on 06/12/23 and lab work 06/07/23.  Confirmed patient is taking Eliquis and will continue taking medication before procedure or it may need to be rescheduled.  Confirmed patient is scheduled for Atrial Fibrillation Ablation /Atrial Flutter Ablation on Wednesday, March 19 with Dr. York Pellant. Instructed patient to arrive at the Main Entrance A at [facility]: [address] [place], [place] [address] and check in at Admitting at 11:00 AM  Advised of plan to go home the same day and will only stay overnight if medically necessary. You MUST have a responsible adult to drive you home and MUST be with you the first 24 hours after you arrive home or your procedure could be cancelled.  Patient verbalized understanding to information provided and is agreeable to proceed with procedure.

## 2023-06-05 NOTE — Telephone Encounter (Signed)
 Attempted to reach patient to discuss upcoming procedure, no answer. Left VM for patient to return call.

## 2023-06-07 ENCOUNTER — Encounter: Payer: Self-pay | Admitting: Cardiovascular Disease

## 2023-06-07 ENCOUNTER — Ambulatory Visit: Payer: Medicare PPO | Attending: Cardiovascular Disease | Admitting: Cardiovascular Disease

## 2023-06-07 VITALS — BP 116/70 | HR 53 | Ht 70.0 in | Wt 160.0 lb

## 2023-06-07 DIAGNOSIS — I471 Supraventricular tachycardia, unspecified: Secondary | ICD-10-CM | POA: Diagnosis not present

## 2023-06-07 DIAGNOSIS — I482 Chronic atrial fibrillation, unspecified: Secondary | ICD-10-CM | POA: Diagnosis not present

## 2023-06-07 NOTE — Progress Notes (Signed)
 Electrophysiology Office Note:    Date:  06/07/2023   ID:  [name] [name], DOB 12-Sep-1945, MRN [id number]  PCP:  Ronnald Nian, MD   [facility] Providers Cardiologist:  Christell Constant, MD Electrophysiologist:  Maurice Small, MD     Referring MD: Ronnald Nian, MD   History of Present Illness:    [name] [name] is a 78 y.o. male with a medical history significant for PAF, CAD (CA calcification and atherosclerosis), referred for arrhythmia management.     I discussed the use of AI scribe software for clinical note transcription with the patient, who gave verbal consent to proceed.   The patient's atrial fibrillation was first diagnosed following an episode in [place], [place], about 10 years ago after an evening of increased alcohol consumption. Since then, the patient has had multiple episodes, often triggered by alcohol, leading to a reduction in alcohol intake. However, in the past year, the patient has experienced episodes of atrial flutter without any obvious triggers, leading to two ER visits in May and November.  He was started on flecainide 50 mg twice daily and metoprolol 25 mg twice daily.  Additionally, he has Cardizem 30 mg as needed.  The onset of congestion and loss of taste and smell coincided with the initiation of flecainide, but the patient is unsure if these symptoms are related to the medication.     Today, He feels well. His heart rate is slow (40 bpm).  He reports that he has low heart rates at baseline and has not been having dizziness, presyncope, increased fatigue.  EKGs/Labs/Other Studies Reviewed Today:     Echocardiogram:  TTE 07/11/2020 EF 65 to 70%.  Normal biatrial size.  No gross valvular abnormalities   Monitors:  7-day ZIO monitor October 2024 -- my interpretation Sinus rhythm heart rate 35 to 128 bpm, average 54 bpm No AF detected Episodes of SVT occurred consistent with atrial runs, longest 5s 1.7% burden of atrial  ectopy No symptom episodes reported     Event monitor 06/2020      Few, brief tracings available -- some likely show AF; there are little data to draw any substantive conclusions.  Stress testing:  Nuclear stress November 2024 Normal study, low risk; normal perfusion  Advanced imaging:   Cardiac catherization   EKG:         Physical Exam:    VS:  BP 116/70 (BP Location: Left Arm, Patient Position: Sitting, Cuff Size: Normal)   Pulse (!) 53   Ht 5\' 10"  (1.778 m)   Wt 160 lb (72.6 kg)   SpO2 99%   BMI 22.96 kg/m     Wt Readings from Last 3 Encounters:  06/07/23 160 lb (72.6 kg)  05/30/23 161 lb 3.2 oz (73.1 kg)  05/02/23 163 lb 9.6 oz (74.2 kg)     GEN: Well nourished, well developed in no acute distress CARDIAC: RRR, no murmurs, rubs, gallops RESPIRATORY:  Normal work of breathing MUSCULOSKELETAL: no edema    ASSESSMENT & PLAN:     Paroxysmal atrial fibrillation Per history, diagnosis dates back about 10 years Strips from 2022 event monitor are very limited but do appear to document AF We discussed management options, and using a shared decision approach decided to proceed with AF ablation Will stop flecainide today given possible side effects as well as history of coronary disease, bradycardia on metoprolol DC metoprolol; will try dronedarone if affordable.  If not we will use amiodarone short-term until ablation.  Atrial flutter ECGs 11/14, 2023 and 5/18 2024 appear most likely to be atrial flutter with 2:1 conduction Precipitated ER visits We will plan for CTI line at the time of AF ablation  Secondary hypercoagulable state CHA2DS2-VASc score is 3 Continue Eliquis 5 mg    Signed, Maurice Small, MD  06/07/2023 10:39 AM    [facility]

## 2023-06-07 NOTE — Patient Instructions (Signed)
 Medication Instructions:  Your physician recommends that you continue on your current medications as directed. Please refer to the Current Medication list given to you today. *If you need a refill on your cardiac medications before your next appointment, please call your pharmacy*   Lab Work: CBC and BMET TODAY - [facility] on the first floor of our building  If you have labs (blood work) drawn today and your tests are completely normal, you will receive your results only by: MyChart Message (if you have MyChart) OR A paper copy in the mail If you have any lab test that is abnormal or we need to change your treatment, we will call you to review the results.   Testing/Procedures: Cardiac CT - see instruction letter Your physician has requested that you have cardiac CT. Cardiac computed tomography (CT) is a painless test that uses an x-ray machine to take clear, detailed pictures of your heart. For further information please visit [email or web address] Please follow instruction sheet as given.  Atrial Fibrillation Ablation - see instruction letter Your physician has recommended that you have an ablation. Catheter ablation is a medical procedure used to treat some cardiac arrhythmias (irregular heartbeats). During catheter ablation, a long, thin, flexible tube is put into a blood vessel in your groin (upper thigh), or neck. This tube is called an ablation catheter. It is then guided to your heart through the blood vessel. Radio frequency waves destroy small areas of heart tissue where abnormal heartbeats may cause an arrhythmia to start. Please see the instruction sheet given to you today.   Follow-Up: At [facility], you and your health needs are our priority.  As part of our continuing mission to provide you with exceptional heart care, we have created designated Provider Care Teams.  These Care Teams include your primary Cardiologist (physician) and Advanced Practice Providers (APPs -   Physician Assistants and Nurse Practitioners) who all work together to provide you with the care you need, when you need it.  We recommend signing up for the patient portal called "MyChart".  Sign up information is provided on this After Visit Summary.  MyChart is used to connect with patients for Virtual Visits (Telemedicine).  Patients are able to view lab/test results, encounter notes, upcoming appointments, etc.  Non-urgent messages can be sent to your provider as well.   To learn more about what you can do with MyChart, go to [email or web address].    Your next appointment:   We will schedule follow up after your ablation   Provider:   York Pellant, MD

## 2023-06-07 NOTE — H&P (View-Only) (Signed)
 Electrophysiology Office Note:    Date:  06/07/2023   ID:  [name] [name], DOB 12-Sep-1945, MRN [id number]  PCP:  Ronnald Nian, MD   [facility] Providers Cardiologist:  Christell Constant, MD Electrophysiologist:  Maurice Small, MD     Referring MD: Ronnald Nian, MD   History of Present Illness:    [name] [name] is a 78 y.o. male with a medical history significant for PAF, CAD (CA calcification and atherosclerosis), referred for arrhythmia management.     I discussed the use of AI scribe software for clinical note transcription with the patient, who gave verbal consent to proceed.   The patient's atrial fibrillation was first diagnosed following an episode in [place], [place], about 10 years ago after an evening of increased alcohol consumption. Since then, the patient has had multiple episodes, often triggered by alcohol, leading to a reduction in alcohol intake. However, in the past year, the patient has experienced episodes of atrial flutter without any obvious triggers, leading to two ER visits in May and November.  He was started on flecainide 50 mg twice daily and metoprolol 25 mg twice daily.  Additionally, he has Cardizem 30 mg as needed.  The onset of congestion and loss of taste and smell coincided with the initiation of flecainide, but the patient is unsure if these symptoms are related to the medication.     Today, He feels well. His heart rate is slow (40 bpm).  He reports that he has low heart rates at baseline and has not been having dizziness, presyncope, increased fatigue.  EKGs/Labs/Other Studies Reviewed Today:     Echocardiogram:  TTE 07/11/2020 EF 65 to 70%.  Normal biatrial size.  No gross valvular abnormalities   Monitors:  7-day ZIO monitor October 2024 -- my interpretation Sinus rhythm heart rate 35 to 128 bpm, average 54 bpm No AF detected Episodes of SVT occurred consistent with atrial runs, longest 5s 1.7% burden of atrial  ectopy No symptom episodes reported     Event monitor 06/2020      Few, brief tracings available -- some likely show AF; there are little data to draw any substantive conclusions.  Stress testing:  Nuclear stress November 2024 Normal study, low risk; normal perfusion  Advanced imaging:   Cardiac catherization   EKG:         Physical Exam:    VS:  BP 116/70 (BP Location: Left Arm, Patient Position: Sitting, Cuff Size: Normal)   Pulse (!) 53   Ht 5\' 10"  (1.778 m)   Wt 160 lb (72.6 kg)   SpO2 99%   BMI 22.96 kg/m     Wt Readings from Last 3 Encounters:  06/07/23 160 lb (72.6 kg)  05/30/23 161 lb 3.2 oz (73.1 kg)  05/02/23 163 lb 9.6 oz (74.2 kg)     GEN: Well nourished, well developed in no acute distress CARDIAC: RRR, no murmurs, rubs, gallops RESPIRATORY:  Normal work of breathing MUSCULOSKELETAL: no edema    ASSESSMENT & PLAN:     Paroxysmal atrial fibrillation Per history, diagnosis dates back about 10 years Strips from 2022 event monitor are very limited but do appear to document AF We discussed management options, and using a shared decision approach decided to proceed with AF ablation Will stop flecainide today given possible side effects as well as history of coronary disease, bradycardia on metoprolol DC metoprolol; will try dronedarone if affordable.  If not we will use amiodarone short-term until ablation.  Atrial flutter ECGs 11/14, 2023 and 5/18 2024 appear most likely to be atrial flutter with 2:1 conduction Precipitated ER visits We will plan for CTI line at the time of AF ablation  Secondary hypercoagulable state CHA2DS2-VASc score is 3 Continue Eliquis 5 mg    Signed, Maurice Small, MD  06/07/2023 10:39 AM    [facility]

## 2023-06-08 LAB — CBC
Hematocrit: 48.8 % (ref 37.5–51.0)
Hemoglobin: 16.1 g/dL (ref 13.0–17.7)
MCH: 31.3 pg (ref 26.6–33.0)
MCHC: 33 g/dL (ref 31.5–35.7)
MCV: 95 fL (ref 79–97)
Platelets: 180 10*3/uL (ref 150–450)
RBC: 5.14 x10E6/uL (ref 4.14–5.80)
RDW: 12.7 % (ref 11.6–15.4)
WBC: 8.1 10*3/uL (ref 3.4–10.8)

## 2023-06-08 LAB — BASIC METABOLIC PANEL
BUN/Creatinine Ratio: 18 (ref 10–24)
BUN: 23 mg/dL (ref 8–27)
CO2: 26 mmol/L (ref 20–29)
Calcium: 9.9 mg/dL (ref 8.6–10.2)
Chloride: 103 mmol/L (ref 96–106)
Creatinine, Ser: 1.25 mg/dL (ref 0.76–1.27)
Glucose: 85 mg/dL (ref 70–99)
Potassium: 4.1 mmol/L (ref 3.5–5.2)
Sodium: 144 mmol/L (ref 134–144)
eGFR: 60 mL/min/{1.73_m2} (ref >59)

## 2023-06-10 DIAGNOSIS — R432 Parageusia: Secondary | ICD-10-CM | POA: Diagnosis not present

## 2023-06-10 DIAGNOSIS — R430 Anosmia: Secondary | ICD-10-CM | POA: Diagnosis not present

## 2023-06-10 DIAGNOSIS — J322 Chronic ethmoidal sinusitis: Secondary | ICD-10-CM | POA: Diagnosis not present

## 2023-06-10 DIAGNOSIS — J324 Chronic pansinusitis: Secondary | ICD-10-CM | POA: Diagnosis not present

## 2023-06-11 ENCOUNTER — Encounter: Payer: Self-pay | Admitting: Internal Medicine

## 2023-06-12 ENCOUNTER — Ambulatory Visit (HOSPITAL_COMMUNITY)
Admission: RE | Admit: 2023-06-12 | Discharge: 2023-06-12 | Disposition: A | Discharge status: Home / Self Care | Payer: Medicare PPO | Source: Ambulatory Visit | Attending: Cardiovascular Disease | Admitting: Cardiovascular Disease

## 2023-06-12 DIAGNOSIS — I471 Supraventricular tachycardia, unspecified: Secondary | ICD-10-CM | POA: Diagnosis not present

## 2023-06-12 DIAGNOSIS — I482 Chronic atrial fibrillation, unspecified: Secondary | ICD-10-CM | POA: Diagnosis not present

## 2023-06-12 MED ORDER — IOHEXOL 350 MG/ML SOLN
95.0000 mL | Freq: Once | INTRAVENOUS | Status: AC | PRN
Start: 2023-06-12 — End: 2023-06-12
  Administered 2023-06-12: 95 mL via INTRAVENOUS

## 2023-06-13 ENCOUNTER — Encounter: Payer: Self-pay | Admitting: Cardiovascular Disease

## 2023-06-27 ENCOUNTER — Telehealth (HOSPITAL_COMMUNITY): Payer: Self-pay

## 2023-06-27 NOTE — Telephone Encounter (Signed)
 Call placed to patient to discuss upcoming procedure.   CT: completed.  Labs: completed.   Any recent signs of acute illness or been started on antibiotics? No Any new medications started? No Any medications to hold? No Any missed doses of blood thinner? No Advised patient to continue taking ANTICOAGULANT: Eliquis (Apixaban) without missing any doses.  Medication instructions:  On the morning of your procedure DO NOT take any medication., including Eliquis or the procedure may be rescheduled. Nothing to eat or drink after midnight prior to your procedure.  Confirmed patient is scheduled for Atrial Fibrillation Ablation and Atrial Flutter Ablation on Wednesday, March 19 with Dr. York Pellant. Instructed patient to arrive at the Main Entrance A at [facility]: [address] [place], [place] [address] and check in at Admitting at 11:00 AM.  Advised of plan to go home the same day and will only stay overnight if medically necessary. You MUST have a responsible adult to drive you home and MUST be with you the first 24 hours after you arrive home or your procedure could be cancelled.  Patient verbalized understanding to all instructions provided and agreed to proceed with procedure.

## 2023-06-28 ENCOUNTER — Encounter: Payer: Self-pay | Admitting: Emergency Medicine

## 2023-07-02 NOTE — Pre-Procedure Instructions (Signed)
 Attempted to call patient regarding procedure instructions.   Left voicemail on the following items: Arrival time 1100 Nothing to eat or drink after midnight No meds AM of procedure Responsible person to drive you home and stay with you for 24 hrs  Have you missed any doses of anti-coagulant Eliquis- should be taken twice a day, if you have missed any doses.  Don't take dose morning of procedure.

## 2023-07-03 ENCOUNTER — Ambulatory Visit (HOSPITAL_BASED_OUTPATIENT_CLINIC_OR_DEPARTMENT_OTHER): Admitting: Anesthesiology

## 2023-07-03 ENCOUNTER — Ambulatory Visit (HOSPITAL_COMMUNITY): Admitting: Anesthesiology

## 2023-07-03 ENCOUNTER — Ambulatory Visit (HOSPITAL_COMMUNITY)
Admission: RE | Admit: 2023-07-03 | Discharge: 2023-07-03 | Disposition: A | Discharge status: Home / Self Care | Payer: Medicare PPO | Attending: Cardiovascular Disease | Admitting: Cardiovascular Disease

## 2023-07-03 ENCOUNTER — Ambulatory Visit (HOSPITAL_COMMUNITY)
Admission: RE | Disposition: A | Discharge status: Home / Self Care | Payer: Self-pay | Source: Home / Self Care | Attending: Cardiovascular Disease

## 2023-07-03 ENCOUNTER — Other Ambulatory Visit: Payer: Self-pay

## 2023-07-03 DIAGNOSIS — D6869 Other thrombophilia: Secondary | ICD-10-CM | POA: Diagnosis not present

## 2023-07-03 DIAGNOSIS — I10 Essential (primary) hypertension: Secondary | ICD-10-CM | POA: Insufficient documentation

## 2023-07-03 DIAGNOSIS — I480 Paroxysmal atrial fibrillation: Secondary | ICD-10-CM | POA: Insufficient documentation

## 2023-07-03 DIAGNOSIS — Z7901 Long term (current) use of anticoagulants: Secondary | ICD-10-CM | POA: Insufficient documentation

## 2023-07-03 DIAGNOSIS — Z87891 Personal history of nicotine dependence: Secondary | ICD-10-CM | POA: Diagnosis not present

## 2023-07-03 DIAGNOSIS — I2510 Atherosclerotic heart disease of native coronary artery without angina pectoris: Secondary | ICD-10-CM | POA: Insufficient documentation

## 2023-07-03 DIAGNOSIS — I483 Typical atrial flutter: Secondary | ICD-10-CM | POA: Diagnosis not present

## 2023-07-03 DIAGNOSIS — I4892 Unspecified atrial flutter: Secondary | ICD-10-CM | POA: Diagnosis not present

## 2023-07-03 DIAGNOSIS — Z79899 Other long term (current) drug therapy: Secondary | ICD-10-CM | POA: Diagnosis not present

## 2023-07-03 HISTORY — PX: ATRIAL FIBRILLATION ABLATION: EP1191

## 2023-07-03 HISTORY — PX: A-FLUTTER ABLATION: EP1230

## 2023-07-03 LAB — POCT ACTIVATED CLOTTING TIME: Activated Clotting Time: 348 s

## 2023-07-03 SURGERY — ATRIAL FIBRILLATION ABLATION
Anesthesia: General

## 2023-07-03 MED ORDER — PROPOFOL 10 MG/ML IV BOLUS
INTRAVENOUS | Status: DC | PRN
  Administered 2023-07-03: 200 mg via INTRAVENOUS

## 2023-07-03 MED ORDER — DEXAMETHASONE SODIUM PHOSPHATE 10 MG/ML IJ SOLN
INTRAMUSCULAR | Status: DC | PRN
  Administered 2023-07-03: 10 mg via INTRAVENOUS

## 2023-07-03 MED ORDER — HEPARIN (PORCINE) IN NACL 1000-0.9 UT/500ML-% IV SOLN
INTRAVENOUS | Status: DC | PRN
  Administered 2023-07-03 (×3): 500 mL

## 2023-07-03 MED ORDER — SUGAMMADEX SODIUM 200 MG/2ML IV SOLN
INTRAVENOUS | Status: DC | PRN
  Administered 2023-07-03: 200 mg via INTRAVENOUS

## 2023-07-03 MED ORDER — LIDOCAINE 2% (20 MG/ML) 5 ML SYRINGE
INTRAMUSCULAR | Status: DC | PRN
  Administered 2023-07-03: 20 mg via INTRAVENOUS

## 2023-07-03 MED ORDER — SODIUM CHLORIDE 0.9 % IV SOLN: INTRAVENOUS | Status: DC

## 2023-07-03 MED ORDER — ONDANSETRON HCL 4 MG/2ML IJ SOLN
INTRAMUSCULAR | Status: DC | PRN
  Administered 2023-07-03: 4 mg via INTRAVENOUS

## 2023-07-03 MED ORDER — PHENYLEPHRINE 80 MCG/ML (10ML) SYRINGE FOR IV PUSH (FOR BLOOD PRESSURE SUPPORT)
PREFILLED_SYRINGE | INTRAVENOUS | Status: DC | PRN
  Administered 2023-07-03: 160 ug via INTRAVENOUS

## 2023-07-03 MED ORDER — ACETAMINOPHEN 325 MG PO TABS: 650.0000 mg | ORAL_TABLET | ORAL | Status: DC | PRN

## 2023-07-03 MED ORDER — SODIUM CHLORIDE 0.9% FLUSH: 3.0000 mL | INTRAVENOUS | Status: DC | PRN

## 2023-07-03 MED ORDER — ATROPINE SULFATE 1 MG/ML IV SOLN
INTRAVENOUS | Status: DC | PRN
  Administered 2023-07-03: 1 mg via INTRAVENOUS

## 2023-07-03 MED ORDER — EPHEDRINE SULFATE-NACL 50-0.9 MG/10ML-% IV SOSY
PREFILLED_SYRINGE | INTRAVENOUS | Status: DC | PRN
  Administered 2023-07-03: 10 mg via INTRAVENOUS

## 2023-07-03 MED ORDER — PROTAMINE SULFATE 10 MG/ML IV SOLN
INTRAVENOUS | Status: DC | PRN
  Administered 2023-07-03: 50 mg via INTRAVENOUS

## 2023-07-03 MED ORDER — PHENYLEPHRINE HCL-NACL 20-0.9 MG/250ML-% IV SOLN
INTRAVENOUS | Status: DC | PRN
  Administered 2023-07-03: 40 ug/min via INTRAVENOUS

## 2023-07-03 MED ORDER — HEPARIN SODIUM (PORCINE) 1000 UNIT/ML IJ SOLN
INTRAMUSCULAR | Status: DC | PRN
  Administered 2023-07-03: 13000 [IU] via INTRAVENOUS

## 2023-07-03 MED ORDER — LACTATED RINGERS IV SOLN: INTRAVENOUS | Status: DC | PRN

## 2023-07-03 MED ORDER — ROCURONIUM BROMIDE 10 MG/ML (PF) SYRINGE
PREFILLED_SYRINGE | INTRAVENOUS | Status: DC | PRN
  Administered 2023-07-03: 50 mg via INTRAVENOUS

## 2023-07-03 MED ORDER — SODIUM CHLORIDE 0.9 % IV SOLN: 250.0000 mL | INTRAVENOUS | Status: DC | PRN

## 2023-07-03 MED ORDER — ONDANSETRON HCL 4 MG/2ML IJ SOLN: 4.0000 mg | Freq: Four times a day (QID) | INTRAMUSCULAR | Status: DC | PRN

## 2023-07-03 MED ORDER — FENTANYL CITRATE (PF) 250 MCG/5ML IJ SOLN
INTRAMUSCULAR | Status: DC | PRN
  Administered 2023-07-03 (×2): 50 ug via INTRAVENOUS

## 2023-07-03 MED ORDER — FENTANYL CITRATE (PF) 100 MCG/2ML IJ SOLN
INTRAMUSCULAR | Status: AC
  Filled 2023-07-03: qty 2

## 2023-07-03 MED ORDER — SODIUM CHLORIDE 0.9% FLUSH: 3.0000 mL | Freq: Two times a day (BID) | INTRAVENOUS | Status: DC

## 2023-07-03 SURGICAL SUPPLY — 22 items
BAG SNAP BAND KOVER 36X36 (MISCELLANEOUS) IMPLANT
BLANKET WARM UNDERBOD FULL ACC (MISCELLANEOUS) ×1 IMPLANT
CABLE PFA RX CATH CONN (CABLE) IMPLANT
CATH EZ STEER NAV 8MM D-F CUR (ABLATOR) IMPLANT
CATH FARAWAVE ABLATION 31 (CATHETERS) IMPLANT
CATH OCTARAY 2.0 F 3-3-3-3-3 (CATHETERS) IMPLANT
CATH SOUNDSTAR ECO 8FR (CATHETERS) IMPLANT
CATH WEBSTER BI DIR CS D-F CRV (CATHETERS) IMPLANT
CLOSURE PERCLOSE PROSTYLE (VASCULAR PRODUCTS) IMPLANT
COVER SWIFTLINK CONNECTOR (BAG) ×1 IMPLANT
DEVICE CLOSURE MYNXGRIP 6/7F (Vascular Products) IMPLANT
DILATOR VESSEL 38 20CM 16FR (INTRODUCER) IMPLANT
GUIDEWIRE INQWIRE 1.5J.035X260 (WIRE) IMPLANT
INQWIRE 1.5J .035X260CM (WIRE) ×1 IMPLANT
KIT VERSACROSS CNCT FARADRIVE (KITS) IMPLANT
PACK EP LF (CUSTOM PROCEDURE TRAY) ×1 IMPLANT
PAD DEFIB RADIO PHYSIO CONN (PAD) ×1 IMPLANT
PATCH CARTO3 (PAD) IMPLANT
SHEATH FARADRIVE STEERABLE (SHEATH) IMPLANT
SHEATH PINNACLE 8F 10CM (SHEATH) IMPLANT
SHEATH PINNACLE 9F 10CM (SHEATH) IMPLANT
SHEATH PROBE COVER 6X72 (BAG) IMPLANT

## 2023-07-03 NOTE — Interval H&P Note (Signed)
 History and Physical Interval Note:  07/03/2023 12:29 PM  [name] [name]  has presented today for surgery, with the diagnosis of afib - aflutter.  The various methods of treatment have been discussed with the patient and family. After consideration of risks, benefits and other options for treatment, the patient has consented to  Procedure(s): ATRIAL FIBRILLATION ABLATION (N/A) A-FLUTTER ABLATION (N/A) as a surgical intervention.  The patient's history has been reviewed, patient examined, no change in status, stable for surgery.  I have reviewed the patient's chart and labs.  Questions were answered to the patient's satisfaction.    I reviewed the patient's CT and labs. There was no LAA thrombus. he  has not missed any doses of anticoagulation, and he took his dose last night. There have been no changes in the patient's diagnoses, medications, or condition since our recent clinic visit. CT scan showed a possible liver hemangioma. I explained this finding to the patient and the recommendation to have an ultrasound, which he can discuss with his primary care doctor.   [name] [name]

## 2023-07-03 NOTE — Progress Notes (Signed)
 Patient walked to the bathroom without difficulties. Bilateral groins level 0, clean, dry, and intact.

## 2023-07-03 NOTE — Anesthesia Preprocedure Evaluation (Addendum)
 Anesthesia Evaluation  Patient identified by MRN, date of birth, ID band Patient awake    Reviewed: Allergy & Precautions, NPO status , Patient's Chart, lab work & pertinent test results  Airway Mallampati: III  TM Distance: >3 FB Neck ROM: Full  Mouth opening: Limited Mouth Opening  Dental  (+) Dental Advisory Given   Pulmonary former smoker   breath sounds clear to auscultation       Cardiovascular hypertension, Pt. on medications + dysrhythmias Atrial Fibrillation  Rhythm:Regular Rate:Normal     Neuro/Psych negative neurological ROS     GI/Hepatic negative GI ROS, Neg liver ROS,,,  Endo/Other  negative endocrine ROS    Renal/GU negative Renal ROS     Musculoskeletal   Abdominal   Peds  Hematology negative hematology ROS (+)   Anesthesia Other Findings   Reproductive/Obstetrics                             Anesthesia Physical Anesthesia Plan  ASA: 2  Anesthesia Plan: General   Post-op Pain Management: Minimal or no pain anticipated   Induction: Intravenous  PONV Risk Score and Plan: 2 and Dexamethasone, Ondansetron and Treatment may vary due to age or medical condition  Airway Management Planned: Oral ETT  Additional Equipment: None  Intra-op Plan:   Post-operative Plan: Extubation in OR  Informed Consent: I have reviewed the patients History and Physical, chart, labs and discussed the procedure including the risks, benefits and alternatives for the proposed anesthesia with the patient or authorized representative who has indicated his/her understanding and acceptance.     Dental advisory given  Plan Discussed with: CRNA  Anesthesia Plan Comments:        Anesthesia Quick Evaluation

## 2023-07-03 NOTE — Transfer of Care (Signed)
 Immediate Anesthesia Transfer of Care Note  Patient: [name] [name]  Procedure(s) Performed: ATRIAL FIBRILLATION ABLATION A-FLUTTER ABLATION  Patient Location: Cath Lab  Anesthesia Type:General  Level of Consciousness: drowsy and patient cooperative  Airway & Oxygen Therapy: Patient Spontanous Breathing and Patient connected to nasal cannula oxygen  Post-op Assessment: Report given to RN and Post -op Vital signs reviewed and stable  Post vital signs: Reviewed and stable  Last Vitals:  Vitals Value Taken Time  BP    Temp    Pulse 75   Resp 12   SpO2 95%     Last Pain:  Vitals:   07/03/23 1236  TempSrc:   PainSc: 0-No pain         Complications: There were no known notable events for this encounter.

## 2023-07-03 NOTE — Discharge Instructions (Addendum)

## 2023-07-03 NOTE — Anesthesia Procedure Notes (Signed)
 Procedure Name: Intubation Date/Time: 07/03/2023 1:15 PM  Performed by: Allyn Kenner, CRNAPre-anesthesia Checklist: Patient identified, Emergency Drugs available, Suction available and Patient being monitored Patient Re-evaluated:Patient Re-evaluated prior to induction Oxygen Delivery Method: Circle System Utilized Preoxygenation: Pre-oxygenation with 100% oxygen Induction Type: IV induction Ventilation: Mask ventilation without difficulty Laryngoscope Size: 4 and Glidescope Grade View: Grade I Tube type: Oral Number of attempts: 1 Airway Equipment and Method: Stylet and Oral airway Placement Confirmation: ETT inserted through vocal cords under direct vision, positive ETCO2 and breath sounds checked- equal and bilateral Secured at: 22 cm Tube secured with: Tape Dental Injury: Teeth and Oropharynx as per pre-operative assessment

## 2023-07-04 ENCOUNTER — Encounter (HOSPITAL_COMMUNITY): Payer: Self-pay | Admitting: Cardiovascular Disease

## 2023-07-04 ENCOUNTER — Telehealth (HOSPITAL_COMMUNITY): Payer: Self-pay

## 2023-07-04 MED FILL — Fentanyl Citrate Preservative Free (PF) Inj 100 MCG/2ML: INTRAMUSCULAR | Qty: 2 | Status: AC

## 2023-07-04 NOTE — Anesthesia Postprocedure Evaluation (Signed)
 Anesthesia Post Note  Patient: [name] [name]  Procedure(s) Performed: ATRIAL FIBRILLATION ABLATION A-FLUTTER ABLATION     Patient location during evaluation: PACU Anesthesia Type: General Level of consciousness: awake and alert Pain management: pain level controlled Vital Signs Assessment: post-procedure vital signs reviewed and stable Respiratory status: spontaneous breathing, nonlabored ventilation, respiratory function stable and patient connected to nasal cannula oxygen Cardiovascular status: blood pressure returned to baseline and stable Postop Assessment: no apparent nausea or vomiting Anesthetic complications: no   There were no known notable events for this encounter.  Last Vitals:  Vitals:   07/03/23 1700 07/03/23 1731  BP:  101/68  Pulse: 73 74  Resp: 12 17  Temp:    SpO2: 97% 97%    Last Pain:  Vitals:   07/03/23 1602  TempSrc:   PainSc: 0-No pain                 Kennieth Rad

## 2023-07-04 NOTE — Telephone Encounter (Signed)
 Spoke with patient to complete post procedure follow up call.  Patient reports no complications with groin sites.   Instructions reviewed with patient:  Remove large bandage at puncture site after 24 hours. It is normal to have bruising, tenderness and a pea or marble sized lump/knot at the groin site which can take up to three months to resolve.  Get help right away if you notice sudden swelling at the puncture site.  Check your puncture site every day for signs of infection: fever, redness, swelling, pus drainage, warmth, foul odor or excessive pain. If this occurs, please call the office at [phone], to speak with the nurse. Get help right away if your puncture site is bleeding and the bleeding does not stop after applying firm pressure to the area.  You may continue to have skipped beats/ atrial fibrillation during the first several months after your procedure.  It is very important not to miss any doses of your blood thinner Eliquis. Patient restarted taking this medication on yesterday, 07/03/23.   You will follow up with the Afib clinic on 07/31/23 and follow up with the APP on 10/01/23.   Patient verbalized understanding to all instructions provided.

## 2023-07-04 NOTE — Telephone Encounter (Signed)
 Attempted to reach patient to follow up with procedure completed on 07/03/23, no answer. Left VM for patient to return call.

## 2023-07-08 DIAGNOSIS — J329 Chronic sinusitis, unspecified: Secondary | ICD-10-CM | POA: Diagnosis not present

## 2023-07-08 DIAGNOSIS — R438 Other disturbances of smell and taste: Secondary | ICD-10-CM | POA: Diagnosis not present

## 2023-07-09 ENCOUNTER — Institutional Professional Consult (permissible substitution) (INDEPENDENT_AMBULATORY_CARE_PROVIDER_SITE_OTHER): Payer: Medicare PPO

## 2023-07-10 ENCOUNTER — Institutional Professional Consult (permissible substitution) (INDEPENDENT_AMBULATORY_CARE_PROVIDER_SITE_OTHER): Payer: Medicare PPO

## 2023-07-31 ENCOUNTER — Ambulatory Visit (HOSPITAL_COMMUNITY)
Admission: RE | Admit: 2023-07-31 | Discharge: 2023-07-31 | Disposition: A | Discharge status: Home / Self Care | Source: Ambulatory Visit | Attending: Physician Assistant | Admitting: Physician Assistant

## 2023-07-31 ENCOUNTER — Encounter (HOSPITAL_COMMUNITY): Payer: Self-pay | Admitting: Physician Assistant

## 2023-07-31 VITALS — BP 142/90 | HR 56 | Ht 70.0 in | Wt 162.4 lb

## 2023-07-31 DIAGNOSIS — I350 Nonrheumatic aortic (valve) stenosis: Secondary | ICD-10-CM | POA: Diagnosis not present

## 2023-07-31 DIAGNOSIS — Z79899 Other long term (current) drug therapy: Secondary | ICD-10-CM | POA: Insufficient documentation

## 2023-07-31 DIAGNOSIS — I2510 Atherosclerotic heart disease of native coronary artery without angina pectoris: Secondary | ICD-10-CM | POA: Diagnosis not present

## 2023-07-31 DIAGNOSIS — Z7901 Long term (current) use of anticoagulants: Secondary | ICD-10-CM | POA: Diagnosis not present

## 2023-07-31 DIAGNOSIS — I440 Atrioventricular block, first degree: Secondary | ICD-10-CM | POA: Diagnosis not present

## 2023-07-31 DIAGNOSIS — E785 Hyperlipidemia, unspecified: Secondary | ICD-10-CM | POA: Diagnosis not present

## 2023-07-31 DIAGNOSIS — I480 Paroxysmal atrial fibrillation: Secondary | ICD-10-CM | POA: Diagnosis not present

## 2023-07-31 DIAGNOSIS — I10 Essential (primary) hypertension: Secondary | ICD-10-CM | POA: Insufficient documentation

## 2023-07-31 DIAGNOSIS — D6869 Other thrombophilia: Secondary | ICD-10-CM | POA: Diagnosis not present

## 2023-07-31 DIAGNOSIS — I4892 Unspecified atrial flutter: Secondary | ICD-10-CM | POA: Insufficient documentation

## 2023-07-31 NOTE — Progress Notes (Signed)
 Primary Care Physician: Ronnald Nian, MD Primary Cardiologist: Christell Constant, MD Electrophysiologist: Maurice Small, MD  Referring Physician: Dr Nelly Laurence   [name] [name] is a 78 y.o. male with a history of CAD, HLD, HTN, OCD, atrial fibrillation who presents for follow up in the [facility].  The patient was initially diagnosed with atrial fibrillation remotely, related to alcohol use. He was eventually started on flecainide but this was changed to Multaq due to possible side effects. He was seen by Dr Nelly Laurence and underwent afib and flutter ablation on 07/02/23. Patient is on Eliquis for stroke prevention.   Patient presents today for follow up for atrial fibrillation. He reports that he has done well since the ablation with no interim symptoms of afib. He is off Multaq. He denies significant chest pain or groin issues.   Today, he denies symptoms of palpitations, chest pain, shortness of breath, orthopnea, PND, lower extremity edema, dizziness, presyncope, syncope, snoring, daytime somnolence, bleeding, or neurologic sequela. The patient is tolerating medications without difficulties and is otherwise without complaint today.    Atrial Fibrillation Risk Factors:  he does not have symptoms or diagnosis of sleep apnea. he does not have a history of rheumatic fever. he does have a history of alcohol use.   Atrial Fibrillation Management history:  Previous antiarrhythmic drugs: flecainide, Multaq Previous cardioversions: none Previous ablations: 07/02/23 Anticoagulation history: Eliquis  ROS- All systems are reviewed and negative except as per the HPI above.  Past Medical History:  Diagnosis Date   Adenomatous polyp of colon 11/13/2017   Anxiety    Depression    Diverticulosis of colon    History of atrial fibrillation 07/28/2012   History of atrial fibrillation without current medication    episode 2014 and 2015 in [place], [place] (copy of  [facility])     History of esophageal dilatation    History of esophageal stricture 04/01/2013   History of kidney stones    History of kidney stones    Hyperlipidemia    mild   Hyperlipidemia with target LDL less than 100 07/04/2011   Hypertension    Left ureteral stone    Obsessive compulsive disorder    OCD (obsessive compulsive disorder) 07/04/2011   PAF (paroxysmal atrial fibrillation) (HCC) 05/26/2020   Prostate cancer (HCC) UROLOGIST-  DR DALHSTEDT   Gleason 3+4,  PSA 3.93, vol 25.2cc--  s/p  prostatectomy 12-24-2013    Current Outpatient Medications  Medication Sig Dispense Refill   clonazePAM (KLONOPIN) 0.5 MG tablet Take 0.5 mg by mouth as needed for anxiety.     diltiazem (CARDIZEM) 30 MG tablet Take 1 tablet (30 mg total) by mouth daily. (Patient taking differently: Take 30 mg by mouth daily as needed (AFIB).) 90 tablet 3   ELIQUIS 5 MG TABS tablet TAKE 1 TABLET(5 MG) BY MOUTH TWICE DAILY 180 tablet 1   Fluvoxamine Maleate 100 MG CP24 Take 2 capsules (200 mg total) by mouth daily. 180 capsule 3   ibuprofen (ADVIL,MOTRIN) 200 MG tablet Take 400 mg by mouth every 6 (six) hours as needed for headache or moderate pain (pain score 4-6).     losartan-hydrochlorothiazide (HYZAAR) 50-12.5 MG tablet Take 1 tablet by mouth daily. 90 tablet 3   Multiple Vitamins-Minerals (MULTIVITAMIN WITH MINERALS) tablet Take 1 tablet by mouth daily.     rosuvastatin (CRESTOR) 10 MG tablet Take 1 tablet (10 mg total) by mouth daily. 90  tablet 3   zolpidem (AMBIEN CR) 6.25 MG CR tablet Take 1 tablet (6.25 mg total) by mouth at bedtime. 90 tablet 0   No current facility-administered medications for this encounter.    Physical Exam: BP (!) 142/90   Pulse (!) 56   Ht 5\' 10"  (1.778 m)   Wt 73.7 kg   BMI 23.30 kg/m   GEN: Well nourished, well developed in no acute distress CARDIAC: Regular rate and rhythm, no murmurs, rubs,  gallops RESPIRATORY:  Clear to auscultation without rales, wheezing or rhonchi  ABDOMEN: Soft, non-tender, non-distended EXTREMITIES:  No edema; No deformity   Wt Readings from Last 3 Encounters:  07/31/23 73.7 kg  07/03/23 70.3 kg  06/07/23 72.6 kg     EKG today demonstrates  SB, 1st degree AV block Vent. rate 56 BPM PR interval 220 ms QRS duration 92 ms QT/QTcB 414/399 ms  Echo 07/11/20 demonstrated   1. Left ventricular ejection fraction, by estimation, is 65 to 70%. The  left ventricle has normal function. The left ventricle has no regional  wall motion abnormalities. Left ventricular diastolic parameters were  normal.   2. Right ventricular systolic function is normal. The right ventricular  size is normal.   3. The mitral valve is normal in structure. Trivial mitral valve  regurgitation.   4. The aortic valve is tricuspid. Aortic valve regurgitation is not  visualized. Mild to moderate aortic valve sclerosis/calcification is  present, without any evidence of aortic stenosis.   5. The inferior vena cava is normal in size with greater than 50%  respiratory variability, suggesting right atrial pressure of 3 mmHg.    CHA2DS2-VASc Score = 4  The patient's score is based upon: CHF History: 0 HTN History: 1 Diabetes History: 0 Stroke History: 0 Vascular Disease History: 1 Age Score: 2 Gender Score: 0       ASSESSMENT AND PLAN: Paroxysmal Atrial Fibrillation/atrial flutter The patient's CHA2DS2-VASc score is 4, indicating a 4.8% annual risk of stroke.   S/p afib and flutter ablation 07/03/23, now off Multaq Patient appears to be maintaining SR Continue Eliquis 5 mg BID with no missed doses for 3 months post ablation.  Continue diltiazem 30 mg daily  Secondary Hypercoagulable State (ICD10:  D68.69) The patient is at significant risk for stroke/thromboembolism based upon his CHA2DS2-VASc Score of 4.  Continue Apixaban (Eliquis).   HTN Stable on current  regimen    Follow up with Mertha Abrahams as scheduled.        Myrtha Ates PA-C [facility] [address] [place], [place] [address] [phone]

## 2023-09-29 NOTE — Progress Notes (Unsigned)
 Cardiology Office Note:  .   Date:  09/29/2023  ID:  [name] [name], DOB Jan 29, 1946, MRN [id number] PCP: Watson Hacking, MD  [facility] Providers Cardiologist:  Jann Melody, MD Electrophysiologist:  Efraim Grange, MD {  History of Present Illness: .   [name] [name] is a 78 y.o. male w/PMHx of  HTN, HLD, hx of esophageal stricture/dilation, kidney stones CT notes: no coronary artery calcification, + aortic valve calcification, and aortic atherosclerosis  AFib AFlutter   He was referred to Dr. Arlester Ladd, saw him 05/02/23, reported side effects with flecainide  > multaq , planned for ablation Pt reported baseline bradycardia without symptoms  Ablation 07/03/23 (PVI and CTI)  He saw the Afib  clinic 07/31/23, no symptoms of arrhythmia, off multaq  since his ablation  Today's visit is scheduled as a post ablation visit ROS:   He is doing very well Was well aware of his AF before made him feel quite poorly, and has had none post ablation No CP, SOB, DOE Walks regularly for exercise and reports excellent exertional capacity No near syncope or syncope, no dizziness No bleeding or signs of  bleeding  Arrhythmia/AAD hx AFib goes back +/- 10 years Flecainide  stopped Jan 2025 > 2/2 side effects (congestion and loss of taste) Jan 2025 multaq  started > stopped post ablation 07/03/23: AFib and CTI/flutter ablation  Studies Reviewed: Aaron Aas    EKG done today and reviewed by myself:  SB 49bpm, 1st degree AVBlock   07/03/23: EPS/ablation CONCLUSIONS: 1. Sinus rhythm upon presentation.   2. Successful ablation of all four pulmonary veins with pulsed field energy. 3. Successful ablation of the posterior wall of the left atrium with pulsed field energy 4.  Successful ablation of the cavotricuspid isthmus for CTI dependent typical appearing flutter 5.  No inducible arrhythmias following ablation  6. No early apparent complications.   ECHOCARDIOGRAM COMPLETE 07/11/2020  1.  Left ventricular ejection fraction, by estimation, is 65 to 70%. The left ventricle has normal function. The left ventricle has no regional wall motion abnormalities. Left ventricular diastolic parameters were normal. 2. Right ventricular systolic function is normal. The right ventricular size is normal. 3. The mitral valve is normal in structure. Trivial mitral valve regurgitation. 4. The aortic valve is tricuspid. Aortic valve regurgitation is not visualized. Mild to moderate aortic valve sclerosis/calcification is present, without any evidence of aortic stenosis. 5. The inferior vena cava is normal in size with greater than 50% respiratory variability, suggesting right atrial pressure of 3 mmHg.   CT CARDIAC SCORING (SELF PAY ONLY) 09/08/2020  IMPRESSION: Coronary calcium  score of 0. This was 0 percentile for age-, race-, and sex-matched controls. The aortic valve is heavily calcified. (Ascending Aorta: Normal caliber. Calcification of the aortic root)   Risk Assessment/Calculations:    Physical Exam:   VS:  There were no vitals taken for this visit.   Wt Readings from Last 3 Encounters:  07/31/23 162 lb 6.4 oz (73.7 kg)  07/03/23 155 lb (70.3 kg)  06/07/23 160 lb (72.6 kg)    GEN: Well nourished, well developed in no acute distress NECK: No JVD; No carotid bruits CARDIAC: RRR, no murmurs, rubs, gallops RESPIRATORY:  CTA b/l without rales, wheezing or rhonchi  ABDOMEN: Soft, non-tender, non-distended EXTREMITIES:  No edema; No deformity   ASSESSMENT AND PLAN: .    paroxysmal AFib AFlutter CHA2DS2Vasc is 4, on Eliquis , appropriately dosed no burden by symptoms  HTN Looks good  Secondary hypercoagulable state 2/2 AFib  Dispo: back in 47mo, sooner if needed  Signed, Debbie Fails, PA-C

## 2023-10-01 ENCOUNTER — Encounter: Payer: Self-pay | Admitting: Physician Assistant

## 2023-10-01 ENCOUNTER — Ambulatory Visit: Attending: Physician Assistant | Admitting: Physician Assistant

## 2023-10-01 VITALS — BP 110/72 | HR 49 | Ht 70.0 in | Wt 164.8 lb

## 2023-10-01 DIAGNOSIS — I10 Essential (primary) hypertension: Secondary | ICD-10-CM

## 2023-10-01 DIAGNOSIS — D6869 Other thrombophilia: Secondary | ICD-10-CM

## 2023-10-01 DIAGNOSIS — I483 Typical atrial flutter: Secondary | ICD-10-CM | POA: Diagnosis not present

## 2023-10-01 DIAGNOSIS — I480 Paroxysmal atrial fibrillation: Secondary | ICD-10-CM

## 2023-10-01 NOTE — Progress Notes (Signed)
 Chief Complaint  Patient presents with   Foot Pain    Right foot pain that sudden came on 2 days ago in the ball of this foot. Needle-like pain. Has lessened over the last day or so. Going on a trip and would like to have looked at.    2 days ago, he got up off the couch and felt needle-like pain in the ball of the right great toe. It hasn't hurt as badly since then, but still hurts.  He looked and didn't see any injury or foreign body. Only hurts to press on it (ie with standing).  He had been wearing old shoes with almost no support.  He had gone for a vigorous walk in these shoes the day before and the morning of the onset of this pain--these shoes are 82-44 years old, and are very worn. The walks were the same as his usual walks, but the weather was much hotter. He plans to get new shoes prior to his trip to [place].   PMH, PSH, SH reviewed Afib, s/p ablation 06/2023  Outpatient Encounter Medications as of 10/02/2023  Medication Sig   clonazePAM  (KLONOPIN ) 0.5 MG tablet Take 0.5 mg by mouth as needed for anxiety.   ELIQUIS  5 MG TABS tablet TAKE 1 TABLET(5 MG) BY MOUTH TWICE DAILY   Fluvoxamine  Maleate 100 MG CP24 Take 2 capsules (200 mg total) by mouth daily.   ibuprofen (ADVIL,MOTRIN) 200 MG tablet Take 400 mg by mouth every 6 (six) hours as needed for headache or moderate pain (pain score 4-6).   losartan -hydrochlorothiazide  (HYZAAR) 50-12.5 MG tablet Take 1 tablet by mouth daily.   Multiple Vitamins-Minerals (MULTIVITAMIN WITH MINERALS) tablet Take 1 tablet by mouth daily.   rosuvastatin  (CRESTOR ) 10 MG tablet Take 1 tablet (10 mg total) by mouth daily.   diltiazem  (CARDIZEM ) 30 MG tablet Take 1 tablet (30 mg total) by mouth daily. (Patient not taking: Reported on 10/02/2023)   [DISCONTINUED] zolpidem  (AMBIEN  CR) 6.25 MG CR tablet Take 1 tablet (6.25 mg total) by mouth at bedtime.   No facility-administered encounter medications on file as of 10/02/2023.   No Known  Allergies  ROS: no f/c, CP, SOB, URI symptoms or other complaints, other than the pain in the right foot.    PHYSICAL EXAM:  BP 130/80   Pulse 60   Ht 5' 10 (1.778 m)   Wt 163 lb (73.9 kg)   BMI 23.39 kg/m   Pleasant, well-appearing male, in no distress Foot and toes appear normal. No inflammation, erythema, swelling 2+ pulses Very focal area of discomfort, with pain upon palpation to the inferomedial aspect of the right 1st MTP.  There is a mild overlying callous.there is no evidence of any skin break or foreign body. Neuro: alert and oriented, cranial nerves grossly intact, normal gait Psych: normal mood, affect, hygiene and grooming   ASSESSMENT/PLAN:  Acute pain of right foot  History of atrial fibrillation - s/p ablation in March. Continues on blood thinners  Pain of R 1st MTP. Possible mild OA, vs related to callous and/or use of poorly cushioned shoes. Consider xrays if not improving.   I recommend going to [facility] (on [address], [address] before [address]) to get fitted for a proper walking shoe. You need to ensure that you have good cushioning under the metatarsal heads, and that the show is wide enough.  You can use tylenol  if needed for pain, or topical voltaren gel. Icing and elevation can also be helpful if  pain is more severe. Do not walk in the old, uncushioned shoes unless adding padding.  Follow-up if you have persistent or worsening discomfort. Return if you have fever, redness, swelling, worsening pain, or any other new concerns.

## 2023-10-01 NOTE — Patient Instructions (Signed)
 Medication Instructions:   Your physician recommends that you continue on your current medications as directed. Please refer to the Current Medication list given to you today.   *If you need a refill on your cardiac medications before your next appointment, please call your pharmacy*  Lab Work: NONE ORDERED  TODAY   If you have labs (blood work) drawn today and your tests are completely normal, you will receive your results only by: MyChart Message (if you have MyChart) OR A paper copy in the mail If you have any lab test that is abnormal or we need to change your treatment, we will call you to review the results.  Testing/Procedures: NONE ORDERED  TODAY     Follow-Up: At [facility], you and your health needs are our priority.  As part of our continuing mission to provide you with exceptional heart care, our providers are all part of one team.  This team includes your primary Cardiologist (physician) and Advanced Practice Providers or APPs (Physician Assistants and Nurse Practitioners) who all work together to provide you with the care you need, when you need it.  Your next appointment:   6 month(s)  Provider:   You may see Efraim Grange, MD or one of the following Advanced Practice Providers on your designated Care Team:   Mertha Abrahams, [place]  We recommend signing up for the patient portal called MyChart.  Sign up information is provided on this After Visit Summary.  MyChart is used to connect with patients for Virtual Visits (Telemedicine).  Patients are able to view lab/test results, encounter notes, upcoming appointments, etc.  Non-urgent messages can be sent to your provider as well.   To learn more about what you can do with MyChart, go to [email or web address].   Other Instructions

## 2023-10-02 ENCOUNTER — Ambulatory Visit: Admitting: Family Medicine

## 2023-10-02 ENCOUNTER — Encounter: Payer: Self-pay | Admitting: Family Medicine

## 2023-10-02 VITALS — BP 130/80 | HR 60 | Ht 70.0 in | Wt 163.0 lb

## 2023-10-02 DIAGNOSIS — Z8679 Personal history of other diseases of the circulatory system: Secondary | ICD-10-CM | POA: Diagnosis not present

## 2023-10-02 DIAGNOSIS — M79671 Pain in right foot: Secondary | ICD-10-CM

## 2023-10-02 NOTE — Patient Instructions (Signed)
 I recommend going to [facility] (on [address], [address] [place]) to get fitted for a proper walking shoe. You need to ensure that you have good cushioning under the metatarsal heads, and that the show is wide enough.  You can use tylenol  if needed for pain, or topical voltaren gel. Icing and elevation can also be helpful if pain is more severe. Do not walk in the old, uncushioned shoes unless adding padding.  Follow-up if you have persistent or worsening discomfort. Return if you have fever, redness, swelling, worsening pain, or any other new concerns.

## 2023-10-17 ENCOUNTER — Other Ambulatory Visit: Payer: Self-pay | Admitting: Internal Medicine

## 2023-10-17 DIAGNOSIS — I480 Paroxysmal atrial fibrillation: Secondary | ICD-10-CM

## 2023-10-17 NOTE — Telephone Encounter (Signed)
 Prescription refill request for Eliquis  received. Indication: PAF Last office visit: 10/01/23 Scr: 1.25 epic 06/07/23 Age: 78 Weight: 73kg

## 2023-11-11 ENCOUNTER — Other Ambulatory Visit: Payer: Self-pay | Admitting: Internal Medicine

## 2023-11-11 DIAGNOSIS — E782 Mixed hyperlipidemia: Secondary | ICD-10-CM

## 2024-04-14 DIAGNOSIS — I480 Paroxysmal atrial fibrillation: Secondary | ICD-10-CM

## 2024-04-14 NOTE — Telephone Encounter (Signed)
 Prescription refill request for Eliquis  received. Indication:afib Last office visit:6/25 Scr: 1.25  6/25 Age:78 Weight:73.9  kg  Prescription refilled

## 2024-06-03 ENCOUNTER — Ambulatory Visit: Payer: Medicare PPO | Admitting: Family Medicine

## 2024-07-15 ENCOUNTER — Ambulatory Visit: Admitting: Internal Medicine
